# Patient Record
Sex: Male | Born: 1960
Health system: Southern US, Community
[De-identification: ages and names within clinical notes are randomized; demographics above are authoritative.]

## PROBLEM LIST (undated history)

## (undated) DIAGNOSIS — Z8719 Personal history of other diseases of the digestive system: Secondary | ICD-10-CM

## (undated) DIAGNOSIS — E785 Hyperlipidemia, unspecified: Secondary | ICD-10-CM

## (undated) DIAGNOSIS — N200 Calculus of kidney: Secondary | ICD-10-CM

## (undated) DIAGNOSIS — K219 Gastro-esophageal reflux disease without esophagitis: Secondary | ICD-10-CM

## (undated) DIAGNOSIS — G4730 Sleep apnea, unspecified: Secondary | ICD-10-CM

## (undated) DIAGNOSIS — M1990 Unspecified osteoarthritis, unspecified site: Secondary | ICD-10-CM

## (undated) DIAGNOSIS — I219 Acute myocardial infarction, unspecified: Secondary | ICD-10-CM

## (undated) DIAGNOSIS — I2510 Atherosclerotic heart disease of native coronary artery without angina pectoris: Secondary | ICD-10-CM

## (undated) DIAGNOSIS — M109 Gout, unspecified: Secondary | ICD-10-CM

## (undated) DIAGNOSIS — I259 Chronic ischemic heart disease, unspecified: Secondary | ICD-10-CM

## (undated) DIAGNOSIS — I10 Essential (primary) hypertension: Secondary | ICD-10-CM

## (undated) DIAGNOSIS — F32A Depression, unspecified: Secondary | ICD-10-CM

## (undated) DIAGNOSIS — F419 Anxiety disorder, unspecified: Secondary | ICD-10-CM

## (undated) DIAGNOSIS — N189 Chronic kidney disease, unspecified: Secondary | ICD-10-CM

## (undated) HISTORY — DX: Depression, unspecified: F32.A

## (undated) HISTORY — DX: Sleep apnea, unspecified: G47.30

## (undated) HISTORY — DX: Anxiety disorder, unspecified: F41.9

## (undated) HISTORY — DX: Chronic ischemic heart disease, unspecified: I25.9

## (undated) HISTORY — DX: Hyperlipidemia, unspecified: E78.5

## (undated) HISTORY — DX: Chronic kidney disease, unspecified: N18.9

## (undated) HISTORY — DX: Calculus of kidney: N20.0

## (undated) HISTORY — DX: Gastro-esophageal reflux disease without esophagitis: K21.9

## (undated) HISTORY — DX: Unspecified osteoarthritis, unspecified site: M19.90

## (undated) HISTORY — DX: Gout, unspecified: M10.9

---

## 1992-10-20 HISTORY — PX: HERNIA REPAIR: SHX51

## 1998-10-20 DIAGNOSIS — I219 Acute myocardial infarction, unspecified: Secondary | ICD-10-CM

## 1998-10-20 HISTORY — PX: CORONARY ANGIOPLASTY: SHX604

## 1998-10-20 HISTORY — DX: Acute myocardial infarction, unspecified: I21.9

## 1998-12-17 ENCOUNTER — Encounter: Payer: Self-pay | Admitting: *Deleted

## 1998-12-17 ENCOUNTER — Observation Stay (HOSPITAL_COMMUNITY): Admission: AD | Admit: 1998-12-17 | Discharge: 1998-12-18 | Payer: Self-pay | Admitting: *Deleted

## 2005-10-20 HISTORY — PX: HIATAL HERNIA REPAIR: SHX195

## 2006-06-23 ENCOUNTER — Emergency Department (HOSPITAL_COMMUNITY): Admission: EM | Admit: 2006-06-23 | Discharge: 2006-06-23 | Payer: Self-pay | Admitting: Emergency Medicine

## 2006-10-20 HISTORY — PX: COLONOSCOPY: SHX174

## 2006-10-20 HISTORY — PX: CYST REMOVAL NECK: SHX6281

## 2008-10-20 HISTORY — PX: OTHER SURGICAL HISTORY: SHX169

## 2008-10-20 HISTORY — PX: CARPAL TUNNEL RELEASE: SHX101

## 2008-11-17 ENCOUNTER — Ambulatory Visit: Payer: Self-pay | Admitting: Cardiology

## 2010-06-06 ENCOUNTER — Ambulatory Visit: Payer: Self-pay | Admitting: Cardiology

## 2014-10-26 ENCOUNTER — Other Ambulatory Visit: Payer: Self-pay | Admitting: Urology

## 2014-10-30 NOTE — Progress Notes (Signed)
Called Dr. Emmaline Potter's office requested orders in Epic to  Pre op 01-15-16be put in under sign and release surgery 11-07-14, Thanks

## 2014-11-02 NOTE — Patient Instructions (Addendum)
Ray ChurchJoseph T Compere  11/02/2014   Your procedure is scheduled on: Tuesday 11/07/2014  [facility]  Entr[facility] and follow signs to               Short Stay Center at  0530 AM.  Call this number if you have problems the morning of surgery [phone]   Remember:  Do not eat food or drink liquids :After Midnight.     Take these medicines the morning of surgery with A SIP OF WATER: Metoprolol, Omeprazole, Prednisone                               You may not have any metal on your body including hair pins and              piercings  Do not wear jewelry, make-up, lotions, powders or perfumes.             Do not wear nail polish.  Do not shave  48 hours prior to surgery.              Men may shave face and neck.   Do not bring valuables to the hospital. [facility] IS NOT             RESPONSIBLE   FOR VALUABLES.  Contacts, dentures or bridgework may not be worn into surgery.  Leave suitcase in the car. After surgery it may be brought to your room.     Patients discharged the day of surgery will not be allowed to drive home.  Name and phone number of your driver:  Special Instructions: N/A              Please read over the following fact sheets you were given: _____________________________________________________________________             [facility] - Preparing for Surgery Before surgery, you can play an important role.  Because skin is not sterile, your skin needs to be as free of germs as possible.  You can reduce the number of germs on your skin by washing with CHG (chlorahexidine gluconate) soap before surgery.  CHG is an antiseptic cleaner which kills germs and bonds with the skin to continue killing germs even after washing. Please DO NOT use if you have an allergy to CHG or antibacterial soaps.  If your skin becomes reddened/irritated stop using the CHG and inform your nurse when you arrive at Short Stay. Do not shave (including legs and  underarms) for at least 48 hours prior to the first CHG shower.  You may shave your face/neck. Please follow these instructions carefully:  1.  Shower with CHG Soap the night before surgery and the  morning of Surgery.  2.  If you choose to wash your hair, wash your hair first as usual with your  normal  shampoo.  3.  After you shampoo, rinse your hair and body thoroughly to remove the  shampoo.                           4.  Use CHG as you would any other liquid soap.  You can apply chg directly  to the skin and wash  Gently with a scrungie or clean washcloth.  5.  Apply the CHG Soap to your body ONLY FROM THE NECK DOWN.   Do not use on face/ open                           Wound or open sores. Avoid contact with eyes, ears mouth and genitals (private parts).                       Wash face,  Genitals (private parts) with your normal soap.             6.  Wash thoroughly, paying special attention to the area where your surgery  will be performed.  7.  Thoroughly rinse your body with warm water from the neck down.  8.  DO NOT shower/wash with your normal soap after using and rinsing off  the CHG Soap.                9.  Pat yourself dry with a clean towel.            10.  Wear clean pajamas.            11.  Place clean sheets on your bed the night of your first shower and do not  sleep with pets. Day of Surgery : Do not apply any lotions/deodorants the morning of surgery.  Please wear clean clothes to the hospital/surgery center.  FAILURE TO FOLLOW THESE INSTRUCTIONS MAY RESULT IN THE CANCELLATION OF YOUR SURGERY PATIENT SIGNATURE_________________________________  NURSE SIGNATURE__________________________________  ________________________________________________________________________   Adam Phenix  An incentive spirometer is a tool that can help keep your lungs clear and active. This tool measures how well you are filling your lungs with each breath. Taking  long deep breaths may help reverse or decrease the chance of developing breathing (pulmonary) problems (especially infection) following:  A long period of time when you are unable to move or be active. BEFORE THE PROCEDURE   If the spirometer includes an indicator to show your best effort, your nurse or respiratory therapist will set it to a desired goal.  If possible, sit up straight or lean slightly forward. Try not to slouch.  Hold the incentive spirometer in an upright position. INSTRUCTIONS FOR USE  1. Sit on the edge of your bed if possible, or sit up as far as you can in bed or on a chair. 2. Hold the incentive spirometer in an upright position. 3. Breathe out normally. 4. Place the mouthpiece in your mouth and seal your lips tightly around it. 5. Breathe in slowly and as deeply as possible, raising the piston or the ball toward the top of the column. 6. Hold your breath for 3-5 seconds or for as long as possible. Allow the piston or ball to fall to the bottom of the column. 7. Remove the mouthpiece from your mouth and breathe out normally. 8. Rest for a few seconds and repeat Steps 1 through 7 at least 10 times every 1-2 hours when you are awake. Take your time and take a few normal breaths between deep breaths. 9. The spirometer may include an indicator to show your best effort. Use the indicator as a goal to work toward during each repetition. 10. After each set of 10 deep breaths, practice coughing to be sure your lungs are clear. If you have an incision (the cut made at the time of surgery),  support your incision when coughing by placing a pillow or rolled up towels firmly against it. Once you are able to get out of bed, walk around indoors and cough well. You may stop using the incentive spirometer when instructed by your caregiver.  RISKS AND COMPLICATIONS  Take your time so you do not get dizzy or light-headed.  If you are in pain, you may need to take or ask for pain  medication before doing incentive spirometry. It is harder to take a deep breath if you are having pain. AFTER USE  Rest and breathe slowly and easily.  It can be helpful to keep track of a log of your progress. Your caregiver can provide you with a simple table to help with this. If you are using the spirometer at home, follow these instructions: [place] IF:   You are having difficultly using the spirometer.  You have trouble using the spirometer as often as instructed.  Your pain medication is not giving enough relief while using the spirometer.  You develop fever of 100.5 F (38.1 C) or higher. SEEK IMMEDIATE MEDICAL CARE IF:   You cough up bloody sputum that had not been present before.  You develop fever of 102 F (38.9 C) or greater.  You develop worsening pain at or near the incision site. MAKE SURE YOU:   Understand these instructions.  Will watch your condition.  Will get help right away if you are not doing well or get worse. Document Released: 02/16/2007 Document Revised: 12/29/2011 Document Reviewed: 04/19/2007 [facility] Patient Information 2014 [place], [place].   ________________________________________________________________________

## 2014-11-03 ENCOUNTER — Encounter (HOSPITAL_COMMUNITY): Payer: Self-pay

## 2014-11-03 ENCOUNTER — Encounter (HOSPITAL_COMMUNITY)
Admission: RE | Admit: 2014-11-03 | Discharge: 2014-11-03 | Disposition: A | Discharge status: Home / Self Care | Payer: 59 | Source: Ambulatory Visit | Attending: Urology | Admitting: Urology

## 2014-11-03 ENCOUNTER — Other Ambulatory Visit: Payer: Self-pay

## 2014-11-03 ENCOUNTER — Ambulatory Visit (HOSPITAL_COMMUNITY)
Admission: RE | Admit: 2014-11-03 | Discharge: 2014-11-03 | Disposition: A | Discharge status: Home / Self Care | Payer: 59 | Source: Ambulatory Visit | Attending: Anesthesiology | Admitting: Anesthesiology

## 2014-11-03 VITALS — BP 108/75 | HR 98 | Temp 98.0°F | Resp 16 | Ht 66.0 in | Wt 206.0 lb

## 2014-11-03 DIAGNOSIS — Z01818 Encounter for other preprocedural examination: Secondary | ICD-10-CM | POA: Insufficient documentation

## 2014-11-03 DIAGNOSIS — I10 Essential (primary) hypertension: Secondary | ICD-10-CM

## 2014-11-03 HISTORY — DX: Essential (primary) hypertension: I10

## 2014-11-03 HISTORY — DX: Acute myocardial infarction, unspecified: I21.9

## 2014-11-03 HISTORY — DX: Atherosclerotic heart disease of native coronary artery without angina pectoris: I25.10

## 2014-11-03 HISTORY — DX: Personal history of other diseases of the digestive system: Z87.19

## 2014-11-03 LAB — CBC
HEMATOCRIT: 49.2 % (ref 39.0–52.0)
HEMOGLOBIN: 16.3 g/dL (ref 13.0–17.0)
MCH: 32.1 pg (ref 26.0–34.0)
MCHC: 33.1 g/dL (ref 30.0–36.0)
MCV: 97 fL (ref 78.0–100.0)
Platelets: 185 10*3/uL (ref 150–400)
RBC: 5.07 MIL/uL (ref 4.22–5.81)
RDW: 12.7 % (ref 11.5–15.5)
WBC: 7.4 10*3/uL (ref 4.0–10.5)

## 2014-11-03 LAB — BASIC METABOLIC PANEL
Anion gap: 8 (ref 5–15)
BUN: 17 mg/dL (ref 6–23)
CALCIUM: 9.9 mg/dL (ref 8.4–10.5)
CO2: 28 mmol/L (ref 19–32)
Chloride: 103 mEq/L (ref 96–112)
Creatinine, Ser: 0.99 mg/dL (ref 0.50–1.35)
Glucose, Bld: 85 mg/dL (ref 70–99)
POTASSIUM: 4.1 mmol/L (ref 3.5–5.1)
Sodium: 139 mmol/L (ref 135–145)

## 2014-11-03 NOTE — Progress Notes (Signed)
12/17/1998-information from [employer] on cardiac stent on chart.

## 2014-11-03 NOTE — Progress Notes (Signed)
   11/03/14 1232  OBSTRUCTIVE SLEEP APNEA  Have you ever been diagnosed with sleep apnea through a sleep study? No  Do you snore loudly (loud enough to be heard through closed doors)?  1  Do you often feel tired, fatigued, or sleepy during the daytime? 1  Has anyone observed you stop breathing during your sleep? 0  Do you have, or are you being treated for high blood pressure? 1  BMI more than 35 kg/m2? 0  Age over 462 years old? 1  Neck circumference greater than 40 cm/16 inches? 1  Gender: 1  Obstructive Sleep Apnea Score 6  Score 4 or greater  Results sent to PCP

## 2014-11-03 NOTE — Progress Notes (Signed)
07/19/2013-EKG on chart from [facility] 10/03/2014-Last office note on chart from [facility] from Dr. Donzetta Sprungerry Daniel

## 2014-11-06 ENCOUNTER — Encounter (HOSPITAL_COMMUNITY): Payer: Self-pay

## 2014-11-06 MED ORDER — GENTAMICIN SULFATE 40 MG/ML IJ SOLN
380.0000 mg | INTRAVENOUS | Status: AC
  Administered 2014-11-07: 380 mg via INTRAVENOUS
  Filled 2014-11-06: qty 9.5

## 2014-11-06 NOTE — Progress Notes (Signed)
Consulted with Dr. Rose abouOkey Dupret EKG from 11/03/2014 and Dr. [name] states "patient is OK for surgery"

## 2014-11-07 ENCOUNTER — Ambulatory Visit (HOSPITAL_COMMUNITY): Payer: 59 | Admitting: Certified Registered Nurse Anesthetist

## 2014-11-07 ENCOUNTER — Encounter (HOSPITAL_COMMUNITY): Payer: Self-pay | Admitting: *Deleted

## 2014-11-07 ENCOUNTER — Ambulatory Visit (HOSPITAL_COMMUNITY)
Admission: RE | Admit: 2014-11-07 | Discharge: 2014-11-07 | Disposition: A | Discharge status: Home / Self Care | Payer: 59 | Source: Ambulatory Visit | Attending: Urology | Admitting: Urology

## 2014-11-07 ENCOUNTER — Encounter (HOSPITAL_COMMUNITY)
Admission: RE | Disposition: A | Discharge status: Home / Self Care | Payer: Self-pay | Source: Ambulatory Visit | Attending: Urology

## 2014-11-07 ENCOUNTER — Ambulatory Visit (HOSPITAL_COMMUNITY): Payer: 59

## 2014-11-07 DIAGNOSIS — F17210 Nicotine dependence, cigarettes, uncomplicated: Secondary | ICD-10-CM | POA: Insufficient documentation

## 2014-11-07 DIAGNOSIS — I10 Essential (primary) hypertension: Secondary | ICD-10-CM | POA: Insufficient documentation

## 2014-11-07 DIAGNOSIS — R310 Gross hematuria: Secondary | ICD-10-CM | POA: Insufficient documentation

## 2014-11-07 DIAGNOSIS — I252 Old myocardial infarction: Secondary | ICD-10-CM | POA: Diagnosis not present

## 2014-11-07 DIAGNOSIS — I2510 Atherosclerotic heart disease of native coronary artery without angina pectoris: Secondary | ICD-10-CM | POA: Insufficient documentation

## 2014-11-07 DIAGNOSIS — Z7982 Long term (current) use of aspirin: Secondary | ICD-10-CM | POA: Insufficient documentation

## 2014-11-07 DIAGNOSIS — N200 Calculus of kidney: Secondary | ICD-10-CM | POA: Insufficient documentation

## 2014-11-07 HISTORY — PX: HOLMIUM LASER APPLICATION: SHX5852

## 2014-11-07 HISTORY — PX: CYSTOSCOPY WITH RETROGRADE PYELOGRAM, URETEROSCOPY AND STENT PLACEMENT: SHX5789

## 2014-11-07 SURGERY — CYSTOURETEROSCOPY, WITH RETROGRADE PYELOGRAM AND STENT INSERTION
Anesthesia: General | Laterality: Bilateral

## 2014-11-07 MED ORDER — OXYCODONE-ACETAMINOPHEN 5-325 MG PO TABS
1.0000 | ORAL_TABLET | Freq: Four times a day (QID) | ORAL | Status: DC | PRN
  Administered 2014-11-07: 2 via ORAL
  Filled 2014-11-07: qty 2

## 2014-11-07 MED ORDER — FENTANYL CITRATE 0.05 MG/ML IJ SOLN
25.0000 ug | INTRAMUSCULAR | Status: DC | PRN
  Administered 2014-11-07 (×4): 25 ug via INTRAVENOUS

## 2014-11-07 MED ORDER — PROPOFOL 10 MG/ML IV BOLUS
INTRAVENOUS | Status: DC | PRN
  Administered 2014-11-07: 25 mg via INTRAVENOUS
  Administered 2014-11-07: 175 mg via INTRAVENOUS

## 2014-11-07 MED ORDER — EPHEDRINE SULFATE 50 MG/ML IJ SOLN
INTRAMUSCULAR | Status: AC
  Filled 2014-11-07: qty 1

## 2014-11-07 MED ORDER — HYDROMORPHONE HCL 1 MG/ML IJ SOLN: 0.2500 mg | INTRAMUSCULAR | Status: DC | PRN

## 2014-11-07 MED ORDER — ACETAMINOPHEN 10 MG/ML IV SOLN
1000.0000 mg | Freq: Once | INTRAVENOUS | Status: AC
  Administered 2014-11-07: 1000 mg via INTRAVENOUS
  Filled 2014-11-07: qty 100

## 2014-11-07 MED ORDER — SULFAMETHOXAZOLE-TRIMETHOPRIM 800-160 MG PO TABS
1.0000 | ORAL_TABLET | Freq: Two times a day (BID) | ORAL | Status: DC
Start: 2014-11-07 — End: 2019-01-31

## 2014-11-07 MED ORDER — OXYCODONE HCL 5 MG PO TABS: 5.0000 mg | ORAL_TABLET | Freq: Once | ORAL | Status: DC | PRN

## 2014-11-07 MED ORDER — LIDOCAINE HCL (CARDIAC) 20 MG/ML IV SOLN
INTRAVENOUS | Status: AC
  Filled 2014-11-07: qty 5

## 2014-11-07 MED ORDER — SODIUM CHLORIDE 0.9 % IV SOLN
10.0000 mg | INTRAVENOUS | Status: DC | PRN
  Administered 2014-11-07: 40 ug/min via INTRAVENOUS

## 2014-11-07 MED ORDER — OXYCODONE-ACETAMINOPHEN 5-325 MG PO TABS: 1.0000 | ORAL_TABLET | Freq: Four times a day (QID) | ORAL | Status: DC | PRN

## 2014-11-07 MED ORDER — ONDANSETRON HCL 4 MG/2ML IJ SOLN
INTRAMUSCULAR | Status: DC | PRN
  Administered 2014-11-07: 4 mg via INTRAVENOUS

## 2014-11-07 MED ORDER — PROPOFOL 10 MG/ML IV BOLUS
INTRAVENOUS | Status: AC
  Filled 2014-11-07: qty 20

## 2014-11-07 MED ORDER — OXYCODONE HCL 5 MG/5ML PO SOLN: 5.0000 mg | Freq: Once | ORAL | Status: DC | PRN

## 2014-11-07 MED ORDER — EPHEDRINE SULFATE 50 MG/ML IJ SOLN
INTRAMUSCULAR | Status: DC | PRN
  Administered 2014-11-07: 10 mg via INTRAVENOUS

## 2014-11-07 MED ORDER — PROMETHAZINE HCL 25 MG/ML IJ SOLN: 6.2500 mg | INTRAMUSCULAR | Status: DC | PRN

## 2014-11-07 MED ORDER — PHENYLEPHRINE HCL 10 MG/ML IJ SOLN
INTRAMUSCULAR | Status: DC | PRN
  Administered 2014-11-07: 40 ug via INTRAVENOUS

## 2014-11-07 MED ORDER — SENNOSIDES-DOCUSATE SODIUM 8.6-50 MG PO TABS: 1.0000 | ORAL_TABLET | Freq: Two times a day (BID) | ORAL | Status: DC

## 2014-11-07 MED ORDER — LIDOCAINE HCL (CARDIAC) 20 MG/ML IV SOLN
INTRAVENOUS | Status: DC | PRN
  Administered 2014-11-07: 75 mg via INTRAVENOUS

## 2014-11-07 MED ORDER — FENTANYL CITRATE 0.05 MG/ML IJ SOLN
INTRAMUSCULAR | Status: AC
  Filled 2014-11-07: qty 2

## 2014-11-07 MED ORDER — ATROPINE SULFATE 0.4 MG/ML IJ SOLN
INTRAMUSCULAR | Status: AC
  Filled 2014-11-07: qty 1

## 2014-11-07 MED ORDER — OXYCODONE HCL 5 MG PO TABS: 5.0000 mg | ORAL_TABLET | Freq: Four times a day (QID) | ORAL | Status: DC | PRN

## 2014-11-07 MED ORDER — MIDAZOLAM HCL 2 MG/2ML IJ SOLN
INTRAMUSCULAR | Status: AC
  Filled 2014-11-07: qty 2

## 2014-11-07 MED ORDER — FENTANYL CITRATE 0.05 MG/ML IJ SOLN
INTRAMUSCULAR | Status: AC
  Filled 2014-11-07: qty 5

## 2014-11-07 MED ORDER — MIDAZOLAM HCL 5 MG/5ML IJ SOLN
INTRAMUSCULAR | Status: DC | PRN
  Administered 2014-11-07 (×2): .5 mg via INTRAVENOUS

## 2014-11-07 MED ORDER — LACTATED RINGERS IV SOLN
INTRAVENOUS | Status: DC | PRN
  Administered 2014-11-07 (×3): via INTRAVENOUS

## 2014-11-07 MED ORDER — PHENYLEPHRINE HCL 10 MG/ML IJ SOLN
INTRAMUSCULAR | Status: AC
  Filled 2014-11-07: qty 1

## 2014-11-07 MED ORDER — 0.9 % SODIUM CHLORIDE (POUR BTL) OPTIME
TOPICAL | Status: DC | PRN
  Administered 2014-11-07: 1000 mL

## 2014-11-07 MED ORDER — ONDANSETRON HCL 4 MG/2ML IJ SOLN
INTRAMUSCULAR | Status: AC
  Filled 2014-11-07: qty 2

## 2014-11-07 MED ORDER — FENTANYL CITRATE 0.05 MG/ML IJ SOLN
INTRAMUSCULAR | Status: DC | PRN
  Administered 2014-11-07 (×2): 50 ug via INTRAVENOUS
  Administered 2014-11-07 (×2): 25 ug via INTRAVENOUS
  Administered 2014-11-07 (×2): 50 ug via INTRAVENOUS

## 2014-11-07 MED ORDER — IOHEXOL 300 MG/ML  SOLN
INTRAMUSCULAR | Status: DC | PRN
  Administered 2014-11-07: 25 mL via URETHRAL

## 2014-11-07 MED ORDER — SODIUM CHLORIDE 0.9 % IJ SOLN
INTRAMUSCULAR | Status: AC
  Filled 2014-11-07: qty 10

## 2014-11-07 SURGICAL SUPPLY — 29 items
BAG URINE DRAINAGE (UROLOGICAL SUPPLIES) ×2 IMPLANT
BASKET LASER NITINOL 1.9FR (BASKET) ×1 IMPLANT
BASKET STNLS GEMINI 4WIRE 3FR (BASKET) IMPLANT
BASKET ZERO TIP NITINOL 2.4FR (BASKET) IMPLANT
BSKT STON RTRVL 120 1.9FR (BASKET) ×1
BSKT STON RTRVL GEM 120X11 3FR (BASKET)
BSKT STON RTRVL ZERO TP 2.4FR (BASKET)
CATH INTERMIT  6FR 70CM (CATHETERS) ×2 IMPLANT
CLOTH BEACON ORANGE TIMEOUT ST (SAFETY) ×2 IMPLANT
DRAPE CAMERA CLOSED 9X96 (DRAPES) ×2 IMPLANT
ELECT REM PT RETURN 9FT ADLT (ELECTROSURGICAL)
ELECTRODE REM PT RTRN 9FT ADLT (ELECTROSURGICAL) IMPLANT
FIBER LASER FLEXIVA 1000 (UROLOGICAL SUPPLIES) IMPLANT
FIBER LASER FLEXIVA 200 (UROLOGICAL SUPPLIES) ×1 IMPLANT
FIBER LASER FLEXIVA 365 (UROLOGICAL SUPPLIES) IMPLANT
FIBER LASER FLEXIVA 550 (UROLOGICAL SUPPLIES) IMPLANT
FIBER LASER TRAC TIP (UROLOGICAL SUPPLIES) ×1 IMPLANT
GLOVE BIOGEL M STRL SZ7.5 (GLOVE) ×2 IMPLANT
GOWN STRL REUS W/TWL LRG LVL3 (GOWN DISPOSABLE) ×2 IMPLANT
GUIDEWIRE ANG ZIPWIRE 038X150 (WIRE) ×3 IMPLANT
GUIDEWIRE STR DUAL SENSOR (WIRE) ×2 IMPLANT
IV NS IRRIG 3000ML ARTHROMATIC (IV SOLUTION) ×2 IMPLANT
PACK CYSTO (CUSTOM PROCEDURE TRAY) ×2 IMPLANT
SHEATH ACCESS URETERAL 38CM (SHEATH) ×1 IMPLANT
SHIELD EYE BINOCULAR (MISCELLANEOUS) IMPLANT
STENT CONTOUR 6FRX28X.038 (STENTS) ×1 IMPLANT
SYRINGE 10CC LL (SYRINGE) IMPLANT
SYRINGE IRR TOOMEY STRL 70CC (SYRINGE) IMPLANT
TUBE FEEDING 8FR 16IN STR KANG (MISCELLANEOUS) ×2 IMPLANT

## 2014-11-07 NOTE — Anesthesia Preprocedure Evaluation (Addendum)
Anesthesia Evaluation  Patient identified by MRN, date of birth, ID band Patient awake    Reviewed: Allergy & Precautions, NPO status , Patient's Chart, lab work & pertinent test results  History of Anesthesia Complications Negative for: history of anesthetic complications  Airway Mallampati: II  TM Distance: >3 FB Neck ROM: Full    Dental no notable dental hx.    Pulmonary Current Smoker,  breath sounds clear to auscultation  Pulmonary exam normal       Cardiovascular Exercise Tolerance: Good hypertension, Pt. on medications and Pt. on home beta blockers + CAD and + Past MI Rhythm:Regular Rate:Normal     Neuro/Psych negative neurological ROS     GI/Hepatic Neg liver ROS, hiatal hernia,   Endo/Other  negative endocrine ROS  Renal/GU      Musculoskeletal   Abdominal   Peds  Hematology negative hematology ROS (+)   Anesthesia Other Findings   Reproductive/Obstetrics                            Anesthesia Physical Anesthesia Plan  ASA: III  Anesthesia Plan: General   Post-op Pain Management:    Induction: Intravenous  Airway Management Planned: LMA  Additional Equipment:   Intra-op Plan:   Post-operative Plan: Extubation in OR  Informed Consent: I have reviewed the patients History and Physical, chart, labs and discussed the procedure including the risks, benefits and alternatives for the proposed anesthesia with the patient or authorized representative who has indicated his/her understanding and acceptance.   Dental advisory given  Plan Discussed with: CRNA  Anesthesia Plan Comments:        Anesthesia Quick Evaluation

## 2014-11-07 NOTE — Addendum Note (Signed)
Addendum  created 11/07/14 1236 by Edison PaceJoanne E Malanie Koloski, CRNA   Modules edited: Anesthesia Medication Administration

## 2014-11-07 NOTE — Transfer of Care (Signed)
Immediate Anesthesia Transfer of Care Note  Patient: Caleb ChurchJoseph T Tellez  Procedure(s) Performed: Procedure(s): CYSTOSCOPY WITH RETROGRADE PYELOGRAM, URETEROSCOPY AND STENT PLACEMENT (Bilateral) HOLMIUM LASER APPLICATION (Bilateral)  Patient Location: PACU  Anesthesia Type:General  Level of Consciousness: awake, alert , oriented, patient cooperative and responds to stimulation  Airway & Oxygen Therapy: Patient Spontanous Breathing and Patient connected to face mask oxygen  Post-op Assessment: Report given to PACU RN, Post -op Vital signs reviewed and stable and Patient moving all extremities  Post vital signs: Reviewed and stable  Complications: No apparent anesthesia complications

## 2014-11-07 NOTE — Discharge Instructions (Signed)
1 - You may have urinary urgency (bladder spasms) and bloody urine on / off with stent in place. This is normal. ° °2 - Call MD or go to ER for fever >102, severe pain / nausea / vomiting not relieved by medications, or acute change in medical status ° °

## 2014-11-07 NOTE — H&P (Signed)
Caleb ChurchJoseph T Potter is an 54 y.o. male.    Chief Complaint: Pre-Op Bilateral Ureteroscopic Stone Manipulation  HPI:    1 - Bilateral Renal Stones - Rt 10mm and Lt 5mm renal stones on eval recurrent gross hematuria 2015. NO colic episodes. Gross hematuria persistnant.   PMH sig for CAD/MI (on ASA).  Today "Caleb Potter" is seen to proceed with bilateral ureteroscopic stone manipulation for diagnostic and theraputic intent.   Past Medical History  Diagnosis Date  . Hypertension   . Myocardial infarction 2000    light MI  . History of hiatal hernia   . Coronary artery disease     Past Surgical History  Procedure Laterality Date  . Coronary angioplasty  2000  . Hernia repair  1994    left inguinal  . Hiatal hernia repair  2007  . Cyst removal neck  2008  . Carpal tunnel release  2010    right wrist  . Right knee reconstruction  2010    right    History reviewed. No pertinent family history. Social History:  reports that he has been smoking Cigarettes.  He has a 40 pack-year smoking history. He does not have any smokeless tobacco history on file. He reports that he drinks alcohol. His drug history is not on file.  Allergies:  Allergies  Allergen Reactions  . Citalopram     Grind teeth. "high power cocaine"     Medications Prior to Admission  Medication Sig Dispense Refill  . acetaminophen (TYLENOL) 650 MG CR tablet Take 650 mg by mouth 4 (four) times daily as needed for pain.    . Ascorbic Acid (VITAMIN C) 1000 MG tablet Take 1,000 mg by mouth daily.    Marland Kitchen. aspirin EC 81 MG tablet Take 81 mg by mouth daily.    . cholecalciferol (VITAMIN D) 1000 UNITS tablet Take 1,000 Units by mouth daily.    . diazepam (VALIUM) 10 MG tablet Take 10 mg by mouth 2 (two) times daily as needed for anxiety.    Marland Kitchen. lisinopril (PRINIVIL,ZESTRIL) 20 MG tablet Take 20 mg by mouth every morning.    . metoprolol succinate (TOPROL-XL) 50 MG 24 hr tablet Take 50 mg by mouth every morning. Take with or immediately  following a meal.    . Omega-3 Fatty Acids (FISH OIL) 1200 MG CAPS Take 1,200 mg by mouth daily.    Marland Kitchen. omeprazole (PRILOSEC) 20 MG capsule Take 20 mg by mouth daily.    . predniSONE (DELTASONE) 5 MG tablet Take 5-10 mg by mouth daily. Will take 1 tablet daily but when it gets cold sometimes will have to take 2 because of fingers locking up.    . simvastatin (ZOCOR) 80 MG tablet Take 40 mg by mouth daily.    . tamsulosin (FLOMAX) 0.4 MG CAPS capsule Take 0.4 mg by mouth daily.    . traMADol (ULTRAM) 50 MG tablet Take 50 mg by mouth every 6 (six) hours as needed (Pain).      No results found for this or any previous visit (from the past 48 hour(s)). No results found.  Review of Systems  Constitutional: Negative.  Negative for fever and chills.  HENT: Negative.   Eyes: Negative.   Respiratory: Negative.   Cardiovascular: Negative.   Gastrointestinal: Negative.   Genitourinary: Positive for hematuria.  Musculoskeletal: Negative.   Skin: Negative.   Neurological: Negative.   Endo/Heme/Allergies: Negative.   Psychiatric/Behavioral: Negative.     Blood pressure 127/81, pulse 91, temperature 98.1 F (  36.7 C), temperature source Oral, resp. rate 18, height  (1.676 m), weight 93.441 kg (206 lb), SpO2 96 %. Physical Exam  Constitutional: He appears well-developed.  HENT:  Head: Normocephalic.  Eyes: Pupils are equal, round, and reactive to light.  Neck: Normal range of motion.  Cardiovascular: Normal rate.   Respiratory: Effort normal.  GI: Soft.  Genitourinary:  No CVAT  Musculoskeletal: Normal range of motion.  Neurological: He is alert.  Skin: Skin is warm.  Psychiatric: He has a normal mood and affect. His behavior is normal. Judgment and thought content normal.     Assessment/Plan   1 - Bilateral Renal Stones - risks and benefits of bilateral ureteroscopy with goal of ruling out other etiologies and managing stones discussed previuosly and reiterated today. Pt voiced  understanding and desire to proceed as planned. Will need bilateral stents peri-op.   Lesieli Bresee 11/07/2014, 7:13 AM

## 2014-11-07 NOTE — Brief Op Note (Signed)
11/07/2014  9:20 AM  PATIENT:  Caleb ChurchJoseph T Potter  54 y.o. male  PRE-OPERATIVE DIAGNOSIS:  HEMATURIA, BILATERAL KIDNEY STONES  POST-OPERATIVE DIAGNOSIS:  HEMATURIA, BILATERAL KIDNEY STONES  PROCEDURE:  Procedure(s): CYSTOSCOPY WITH RETROGRADE PYELOGRAM, URETEROSCOPY AND STENT PLACEMENT (Bilateral) HOLMIUM LASER APPLICATION (Bilateral)  SURGEON:  Surgeon(s) and Role:    * Sebastian Acheheodore Laelynn Blizzard, MD - Primary  PHYSICIAN ASSISTANT:   ASSISTANTS: none   ANESTHESIA:   general  EBL:  Total I/O In: 1000 [I.V.:1000] Out: -   BLOOD ADMINISTERED:none  DRAINS: none   LOCAL MEDICATIONS USED:  NONE  SPECIMEN:  Source of Specimen:  Rt and Lt renal stone fragments  DISPOSITION OF SPECIMEN:  PATHOLOGY  COUNTS:  YES  TOURNIQUET:  * No tourniquets in log *  DICTATION: .Other Dictation: Dictation Number [id number]  PLAN OF CARE: Discharge to home after PACU  PATIENT DISPOSITION:  PACU - hemodynamically stable.   Delay start of Pharmacological VTE agent (>24hrs) due to surgical blood loss or risk of bleeding: yes

## 2014-11-07 NOTE — Op Note (Signed)
NAMBeverly Potter:  Kallal, Michaeal               ACCOUNT NO.:  [id number]  MEDICAL RECORD NO.:  [id number]  LOCATION:  [facility]                         [facility]  PHYSICIAN:  Sebastian Acheheodore Meerab Maselli, MD     DATE OF BIRTH:  [birth year]  DATE OF PROCEDURE:  11/07/2014 DATE OF DISCHARGE:                              OPERATIVE REPORT   DIAGNOSES:  Right greater than left nephrolithiasis, recurrent gross hematuria.  PROCEDURE: 1. Cystoscopy with bilateral retrograde pyelogram interpretation. 2. Bilateral ureteroscopy with laser lithotripsy. 3. Insertion of bilateral ureteral stents, right 6 x 28, left 6 x 26,     no tether contour type.  ESTIMATED BLOOD LOSS:  Nil.  COMPLICATIONS:  None.  SPECIMENS:  Right and left renal stone fragments for compositional analysis.  FINDINGS:  Right nephrolithiasis dominant proximal 1 cm stone, smaller 5 mm stone, and left intrarenal nephrolithiasis with 5 mm upper pole stone.  No intraureteral or perirenal lesion seen.  No bladder lesion seen.  INDICATIONS:  Mr. Caleb Potter is a pleasant 54 year old gentleman with history of recurrent gross hematuria.  His workup only revealed bilateral intrarenal stones.  His hematuria has been gross in nature and quite bothersome to him.  It was felt to be presumably from his intrarenal stones.  Options were discussed including observation versus therapy to remove the stones including bilateral ureteroscopy with the goal being to obtain stone free status and help her find no other cause for hematuria and he adamantly wished to proceed.  Informed consent was obtained and placed in medical record.  PROCEDURE IN DETAIL:  The patient being, Caleb MilchJoseph Pesqueira was verified. Procedure being bilateral ureteroscopic stone manipulation was confirmed.  Procedure was carried out.  Time-out was performed. Intravenous antibiotics were administered.  General LMA anesthesia was induced.  The patient was placed into a low lithotomy position.   Sterile field was created by prepping and draping the patient's penis, perineum, and proximal thighs using iodine x3.  Next, cystourethroscopy was performed using a 22-French rigid cystoscope with 12-degree offset lens. Inspection of the anterior and posterior urethra unremarkable. Inspection of the urinary bladder revealed no diverticula, calcifications, or papular lesions.  Ureteral orifices were in normal anatomic position and singleton.  The right ureteral orifice was cannulated with a 6-French end-hole catheter and right retrograde pyelogram was obtained.  Right retrograde pyelogram demonstrated a single right ureter, single system right kidney.  There were no obvious filling defects or narrowing noted.  A 0.038 zip wire was advanced at the level of the upper pole and set aside as a safety wire.  Next, semi-rigid ureteroscopy performed of the distal two-thirds of the right ureter alongside a separate Sensor working wire.  No mucosal abnormalities were found.  The semi-rigid scope was exchanged for a 12/14, 38 cm ureteral access sheath at the level of proximal ureter using continuous fluoroscopic guidance.  Next, flexible digital ureteroscopy was performed at the proximal third of the right ureter and systematic inspection of the right kidney x2.  Proximal ureter was unremarkable.  There were 2 calcifications in the kidney, one larger 1 cm stone in the upper pole and smaller approximately 5 mm stone in the mid pole calyx.  Stones  appeared to be much too large for simple basketing.  As such, holmium laser energy was applied to these using settings of 0.2 joules and 20 Hz fragmenting the stone into the pieces approximately 2 mm or less in diameter.  These were then sequentially grasped with escape basket and brought out in their entirety, set aside for compositional analysis.  Repeat endoscopic examination of the right kidney x2 revealed complete resolution of all stone fragments,  larger than 1/3 mm.  Excellent hemostasis.  No evidence of perforation.  Given large volume stone bilaterally it was felt that stenting would clearly be warranted.  The sheath was removed under continuous ureteroscopic vision.  No mucosal abnormalities were seen.  Finally a new 6 x 28 contour type stent was placed over the right safety wire using cystoscopic and fluoroscopic guidance.  Good proximal and distal deployment were noted.  There was some coiling in the upper aspect within the renal pelvis.  It was felt to be still in satisfactory position.  Attention was directed to the left side.  The left ureter was cannulated with 6-French end-hole catheter and left retrograde pyelogram was obtained.  Left retrograde pyelogram demonstrated a single left ureter, single system left kidney.  No filling defects or narrowing noted.  A 0.038 zip wire was advanced to the level of upper pole and set aside as a safety wire.  Next, semi-rigid ureteroscopy was performed of the distal two- thirds of the left ureter alongside of a separate Sensor working wire up and the semi-rigid ureteroscope was exchanged for a 12/14, 38-cm ureteral access sheath to the mid ureter.  There was some tortuosity and narrowing did not allow passage of this.  Next, flexible digital ureteroscopy was performed using 8-French digital ureteroscope in the proximal left ureter, no mucosal abnormalities were found.  Inspection of the kidney x2 revealed a single 5 mm stone in the midpole calyx. Given the tortuosity and mild narrowing in the left ureter, it was felt that repeat basketing back and forth would not be ideal.  As such, the stone was managed using a dusting technique using 0.2 joules and 20 Hz fragmenting the stone into pieces of 1/3 mm or less in diameter.  The access sheath was then removed under continuous ureteroscopic vision. No mucosal abnormalities were found.  Finally, a new 6 x 26 stent was placed with  remaining safety wire.  Good proximal and distal curl were noted.  Bladder was emptied per cystoscope.  Procedure was then terminated.  The patient tolerated the procedure well.  There were no immediate periprocedural complications.  The patient was taken to the postanesthesia care unit in stable condition.          ______________________________ Sebastian Ache, MD     TM/MEDQ  D:  11/07/2014  T:  11/07/2014  Job:  [id number]

## 2014-11-07 NOTE — Anesthesia Procedure Notes (Signed)
Procedure Name: LMA Insertion Date/Time: 11/07/2014 7:37 AM Performed by: Edison PaceGRAY, Romayne Ticas E Pre-anesthesia Checklist: Patient identified, Emergency Drugs available, Suction available, Patient being monitored and Timeout performed Patient Re-evaluated:Patient Re-evaluated prior to inductionOxygen Delivery Method: Circle system utilized Preoxygenation: Pre-oxygenation with 100% oxygen Intubation Type: IV induction LMA: LMA inserted LMA Size: 4.0 Tube size: 4.0 mm Number of attempts: 1 Placement Confirmation: positive ETCO2 Tube secured with: Tape Dental Injury: Teeth and Oropharynx as per pre-operative assessment  Comments: Preexisting sore or ulceration lower right lip

## 2014-11-07 NOTE — Anesthesia Postprocedure Evaluation (Signed)
  Anesthesia Post-op Note  Patient: Caleb Potter  Procedure(s) Performed: Procedure(s) (LRB): CYSTOSCOPY WITH RETROGRADE PYELOGRAM, URETEROSCOPY AND STENT PLACEMENT (Bilateral) HOLMIUM LASER APPLICATION (Bilateral)  Patient Location: PACU  Anesthesia Type: General  Level of Consciousness: awake and alert   Airway and Oxygen Therapy: Patient Spontanous Breathing  Post-op Pain: mild  Post-op Assessment: Post-op Vital signs reviewed, Patient's Cardiovascular Status Stable, Respiratory Function Stable, Patent Airway and No signs of Nausea or vomiting  Last Vitals:  Filed Vitals:   11/07/14 1055  BP: 113/72  Pulse: 77  Temp: 36.7 C  Resp: 16    Post-op Vital Signs: stable   Complications: No apparent anesthesia complications

## 2014-11-08 ENCOUNTER — Encounter (HOSPITAL_COMMUNITY): Payer: Self-pay | Admitting: Urology

## 2016-02-27 DIAGNOSIS — E782 Mixed hyperlipidemia: Secondary | ICD-10-CM | POA: Diagnosis not present

## 2016-02-27 DIAGNOSIS — I10 Essential (primary) hypertension: Secondary | ICD-10-CM | POA: Diagnosis not present

## 2016-03-05 DIAGNOSIS — Z1212 Encounter for screening for malignant neoplasm of rectum: Secondary | ICD-10-CM | POA: Diagnosis not present

## 2016-03-05 DIAGNOSIS — E8881 Metabolic syndrome: Secondary | ICD-10-CM | POA: Diagnosis not present

## 2016-07-04 DIAGNOSIS — R739 Hyperglycemia, unspecified: Secondary | ICD-10-CM | POA: Diagnosis not present

## 2016-07-04 DIAGNOSIS — I10 Essential (primary) hypertension: Secondary | ICD-10-CM | POA: Diagnosis not present

## 2016-07-04 DIAGNOSIS — K219 Gastro-esophageal reflux disease without esophagitis: Secondary | ICD-10-CM | POA: Diagnosis not present

## 2016-07-04 DIAGNOSIS — E782 Mixed hyperlipidemia: Secondary | ICD-10-CM | POA: Diagnosis not present

## 2016-07-04 DIAGNOSIS — E8881 Metabolic syndrome: Secondary | ICD-10-CM | POA: Diagnosis not present

## 2016-07-07 DIAGNOSIS — F17210 Nicotine dependence, cigarettes, uncomplicated: Secondary | ICD-10-CM | POA: Diagnosis not present

## 2016-07-07 DIAGNOSIS — Z23 Encounter for immunization: Secondary | ICD-10-CM | POA: Diagnosis not present

## 2016-07-07 DIAGNOSIS — E782 Mixed hyperlipidemia: Secondary | ICD-10-CM | POA: Diagnosis not present

## 2016-07-07 DIAGNOSIS — E8881 Metabolic syndrome: Secondary | ICD-10-CM | POA: Diagnosis not present

## 2016-08-26 DIAGNOSIS — N451 Epididymitis: Secondary | ICD-10-CM | POA: Diagnosis not present

## 2016-08-26 DIAGNOSIS — D171 Benign lipomatous neoplasm of skin and subcutaneous tissue of trunk: Secondary | ICD-10-CM | POA: Diagnosis not present

## 2016-08-26 DIAGNOSIS — D485 Neoplasm of uncertain behavior of skin: Secondary | ICD-10-CM | POA: Diagnosis not present

## 2016-08-26 DIAGNOSIS — Z6835 Body mass index (BMI) 35.0-35.9, adult: Secondary | ICD-10-CM | POA: Diagnosis not present

## 2016-10-22 DIAGNOSIS — J111 Influenza due to unidentified influenza virus with other respiratory manifestations: Secondary | ICD-10-CM | POA: Diagnosis not present

## 2016-10-22 DIAGNOSIS — Z6835 Body mass index (BMI) 35.0-35.9, adult: Secondary | ICD-10-CM | POA: Diagnosis not present

## 2016-12-04 DIAGNOSIS — Z0001 Encounter for general adult medical examination with abnormal findings: Secondary | ICD-10-CM | POA: Diagnosis not present

## 2016-12-09 DIAGNOSIS — E8881 Metabolic syndrome: Secondary | ICD-10-CM | POA: Diagnosis not present

## 2016-12-09 DIAGNOSIS — Z1212 Encounter for screening for malignant neoplasm of rectum: Secondary | ICD-10-CM | POA: Diagnosis not present

## 2016-12-09 DIAGNOSIS — Z0001 Encounter for general adult medical examination with abnormal findings: Secondary | ICD-10-CM | POA: Diagnosis not present

## 2016-12-09 DIAGNOSIS — E782 Mixed hyperlipidemia: Secondary | ICD-10-CM | POA: Diagnosis not present

## 2016-12-09 DIAGNOSIS — F331 Major depressive disorder, recurrent, moderate: Secondary | ICD-10-CM | POA: Diagnosis not present

## 2016-12-09 DIAGNOSIS — F17210 Nicotine dependence, cigarettes, uncomplicated: Secondary | ICD-10-CM | POA: Diagnosis not present

## 2016-12-23 DIAGNOSIS — Z23 Encounter for immunization: Secondary | ICD-10-CM | POA: Diagnosis not present

## 2017-01-01 DIAGNOSIS — M10071 Idiopathic gout, right ankle and foot: Secondary | ICD-10-CM | POA: Diagnosis not present

## 2017-01-01 DIAGNOSIS — Z6834 Body mass index (BMI) 34.0-34.9, adult: Secondary | ICD-10-CM | POA: Diagnosis not present

## 2017-03-10 DIAGNOSIS — J440 Chronic obstructive pulmonary disease with acute lower respiratory infection: Secondary | ICD-10-CM | POA: Diagnosis not present

## 2017-03-10 DIAGNOSIS — Z6835 Body mass index (BMI) 35.0-35.9, adult: Secondary | ICD-10-CM | POA: Diagnosis not present

## 2017-04-03 DIAGNOSIS — I10 Essential (primary) hypertension: Secondary | ICD-10-CM | POA: Diagnosis not present

## 2017-04-03 DIAGNOSIS — E8881 Metabolic syndrome: Secondary | ICD-10-CM | POA: Diagnosis not present

## 2017-04-03 DIAGNOSIS — K219 Gastro-esophageal reflux disease without esophagitis: Secondary | ICD-10-CM | POA: Diagnosis not present

## 2017-04-03 DIAGNOSIS — E782 Mixed hyperlipidemia: Secondary | ICD-10-CM | POA: Diagnosis not present

## 2017-04-08 DIAGNOSIS — E782 Mixed hyperlipidemia: Secondary | ICD-10-CM | POA: Diagnosis not present

## 2017-04-08 DIAGNOSIS — I10 Essential (primary) hypertension: Secondary | ICD-10-CM | POA: Diagnosis not present

## 2017-04-08 DIAGNOSIS — E8881 Metabolic syndrome: Secondary | ICD-10-CM | POA: Diagnosis not present

## 2017-04-08 DIAGNOSIS — I2510 Atherosclerotic heart disease of native coronary artery without angina pectoris: Secondary | ICD-10-CM | POA: Diagnosis not present

## 2017-04-08 DIAGNOSIS — F17210 Nicotine dependence, cigarettes, uncomplicated: Secondary | ICD-10-CM | POA: Diagnosis not present

## 2017-08-07 DIAGNOSIS — E8881 Metabolic syndrome: Secondary | ICD-10-CM | POA: Diagnosis not present

## 2017-08-07 DIAGNOSIS — I10 Essential (primary) hypertension: Secondary | ICD-10-CM | POA: Diagnosis not present

## 2017-08-07 DIAGNOSIS — E782 Mixed hyperlipidemia: Secondary | ICD-10-CM | POA: Diagnosis not present

## 2017-08-07 DIAGNOSIS — J440 Chronic obstructive pulmonary disease with acute lower respiratory infection: Secondary | ICD-10-CM | POA: Diagnosis not present

## 2017-08-13 DIAGNOSIS — F17210 Nicotine dependence, cigarettes, uncomplicated: Secondary | ICD-10-CM | POA: Diagnosis not present

## 2017-08-13 DIAGNOSIS — Z23 Encounter for immunization: Secondary | ICD-10-CM | POA: Diagnosis not present

## 2017-08-13 DIAGNOSIS — I2510 Atherosclerotic heart disease of native coronary artery without angina pectoris: Secondary | ICD-10-CM | POA: Diagnosis not present

## 2017-08-13 DIAGNOSIS — E8881 Metabolic syndrome: Secondary | ICD-10-CM | POA: Diagnosis not present

## 2017-08-13 DIAGNOSIS — I10 Essential (primary) hypertension: Secondary | ICD-10-CM | POA: Diagnosis not present

## 2017-08-13 DIAGNOSIS — E782 Mixed hyperlipidemia: Secondary | ICD-10-CM | POA: Diagnosis not present

## 2017-09-01 DIAGNOSIS — Z1211 Encounter for screening for malignant neoplasm of colon: Secondary | ICD-10-CM | POA: Diagnosis not present

## 2017-09-01 DIAGNOSIS — Z1212 Encounter for screening for malignant neoplasm of rectum: Secondary | ICD-10-CM | POA: Diagnosis not present

## 2017-12-22 DIAGNOSIS — I10 Essential (primary) hypertension: Secondary | ICD-10-CM | POA: Diagnosis not present

## 2017-12-22 DIAGNOSIS — E782 Mixed hyperlipidemia: Secondary | ICD-10-CM | POA: Diagnosis not present

## 2017-12-22 DIAGNOSIS — F331 Major depressive disorder, recurrent, moderate: Secondary | ICD-10-CM | POA: Diagnosis not present

## 2017-12-22 DIAGNOSIS — R300 Dysuria: Secondary | ICD-10-CM | POA: Diagnosis not present

## 2017-12-22 DIAGNOSIS — I2510 Atherosclerotic heart disease of native coronary artery without angina pectoris: Secondary | ICD-10-CM | POA: Diagnosis not present

## 2017-12-22 DIAGNOSIS — E6609 Other obesity due to excess calories: Secondary | ICD-10-CM | POA: Diagnosis not present

## 2017-12-22 DIAGNOSIS — F17210 Nicotine dependence, cigarettes, uncomplicated: Secondary | ICD-10-CM | POA: Diagnosis not present

## 2017-12-22 DIAGNOSIS — E8881 Metabolic syndrome: Secondary | ICD-10-CM | POA: Diagnosis not present

## 2017-12-22 DIAGNOSIS — Z1212 Encounter for screening for malignant neoplasm of rectum: Secondary | ICD-10-CM | POA: Diagnosis not present

## 2017-12-22 DIAGNOSIS — Z0001 Encounter for general adult medical examination with abnormal findings: Secondary | ICD-10-CM | POA: Diagnosis not present

## 2017-12-29 DIAGNOSIS — C44329 Squamous cell carcinoma of skin of other parts of face: Secondary | ICD-10-CM | POA: Diagnosis not present

## 2017-12-29 DIAGNOSIS — L570 Actinic keratosis: Secondary | ICD-10-CM | POA: Diagnosis not present

## 2018-04-27 DIAGNOSIS — E782 Mixed hyperlipidemia: Secondary | ICD-10-CM | POA: Diagnosis not present

## 2018-04-27 DIAGNOSIS — E8881 Metabolic syndrome: Secondary | ICD-10-CM | POA: Diagnosis not present

## 2018-04-27 DIAGNOSIS — R7301 Impaired fasting glucose: Secondary | ICD-10-CM | POA: Diagnosis not present

## 2018-04-27 DIAGNOSIS — F17210 Nicotine dependence, cigarettes, uncomplicated: Secondary | ICD-10-CM | POA: Diagnosis not present

## 2018-04-27 DIAGNOSIS — I10 Essential (primary) hypertension: Secondary | ICD-10-CM | POA: Diagnosis not present

## 2018-05-05 DIAGNOSIS — N644 Mastodynia: Secondary | ICD-10-CM | POA: Diagnosis not present

## 2018-05-05 DIAGNOSIS — N62 Hypertrophy of breast: Secondary | ICD-10-CM | POA: Diagnosis not present

## 2018-05-05 DIAGNOSIS — R928 Other abnormal and inconclusive findings on diagnostic imaging of breast: Secondary | ICD-10-CM | POA: Diagnosis not present

## 2018-09-02 DIAGNOSIS — K219 Gastro-esophageal reflux disease without esophagitis: Secondary | ICD-10-CM | POA: Diagnosis not present

## 2018-09-02 DIAGNOSIS — E782 Mixed hyperlipidemia: Secondary | ICD-10-CM | POA: Diagnosis not present

## 2018-09-02 DIAGNOSIS — I10 Essential (primary) hypertension: Secondary | ICD-10-CM | POA: Diagnosis not present

## 2018-09-02 DIAGNOSIS — E8881 Metabolic syndrome: Secondary | ICD-10-CM | POA: Diagnosis not present

## 2018-09-02 DIAGNOSIS — R739 Hyperglycemia, unspecified: Secondary | ICD-10-CM | POA: Diagnosis not present

## 2018-09-08 DIAGNOSIS — Z23 Encounter for immunization: Secondary | ICD-10-CM | POA: Diagnosis not present

## 2018-09-08 DIAGNOSIS — E8881 Metabolic syndrome: Secondary | ICD-10-CM | POA: Diagnosis not present

## 2018-09-08 DIAGNOSIS — E782 Mixed hyperlipidemia: Secondary | ICD-10-CM | POA: Diagnosis not present

## 2018-09-08 DIAGNOSIS — F17210 Nicotine dependence, cigarettes, uncomplicated: Secondary | ICD-10-CM | POA: Diagnosis not present

## 2018-12-27 DIAGNOSIS — Z0001 Encounter for general adult medical examination with abnormal findings: Secondary | ICD-10-CM | POA: Diagnosis not present

## 2018-12-30 DIAGNOSIS — I2510 Atherosclerotic heart disease of native coronary artery without angina pectoris: Secondary | ICD-10-CM | POA: Diagnosis not present

## 2018-12-30 DIAGNOSIS — I10 Essential (primary) hypertension: Secondary | ICD-10-CM | POA: Diagnosis not present

## 2018-12-30 DIAGNOSIS — E8881 Metabolic syndrome: Secondary | ICD-10-CM | POA: Diagnosis not present

## 2018-12-30 DIAGNOSIS — Z1212 Encounter for screening for malignant neoplasm of rectum: Secondary | ICD-10-CM | POA: Diagnosis not present

## 2018-12-30 DIAGNOSIS — Z0001 Encounter for general adult medical examination with abnormal findings: Secondary | ICD-10-CM | POA: Diagnosis not present

## 2018-12-30 DIAGNOSIS — E782 Mixed hyperlipidemia: Secondary | ICD-10-CM | POA: Diagnosis not present

## 2019-01-05 DIAGNOSIS — J439 Emphysema, unspecified: Secondary | ICD-10-CM | POA: Diagnosis not present

## 2019-01-05 DIAGNOSIS — F17210 Nicotine dependence, cigarettes, uncomplicated: Secondary | ICD-10-CM | POA: Diagnosis not present

## 2019-01-05 DIAGNOSIS — I700 Atherosclerosis of aorta: Secondary | ICD-10-CM | POA: Diagnosis not present

## 2019-01-05 DIAGNOSIS — Z136 Encounter for screening for cardiovascular disorders: Secondary | ICD-10-CM | POA: Diagnosis not present

## 2019-01-26 DIAGNOSIS — N3289 Other specified disorders of bladder: Secondary | ICD-10-CM | POA: Diagnosis not present

## 2019-01-26 DIAGNOSIS — N200 Calculus of kidney: Secondary | ICD-10-CM | POA: Diagnosis not present

## 2019-01-26 DIAGNOSIS — N202 Calculus of kidney with calculus of ureter: Secondary | ICD-10-CM | POA: Diagnosis not present

## 2019-01-26 DIAGNOSIS — N400 Enlarged prostate without lower urinary tract symptoms: Secondary | ICD-10-CM | POA: Diagnosis not present

## 2019-01-26 DIAGNOSIS — K760 Fatty (change of) liver, not elsewhere classified: Secondary | ICD-10-CM | POA: Diagnosis not present

## 2019-01-26 DIAGNOSIS — Z6836 Body mass index (BMI) 36.0-36.9, adult: Secondary | ICD-10-CM | POA: Diagnosis not present

## 2019-01-26 DIAGNOSIS — R319 Hematuria, unspecified: Secondary | ICD-10-CM | POA: Diagnosis not present

## 2019-01-27 DIAGNOSIS — R319 Hematuria, unspecified: Secondary | ICD-10-CM | POA: Diagnosis not present

## 2019-01-27 DIAGNOSIS — Z6835 Body mass index (BMI) 35.0-35.9, adult: Secondary | ICD-10-CM | POA: Diagnosis not present

## 2019-01-27 DIAGNOSIS — N3090 Cystitis, unspecified without hematuria: Secondary | ICD-10-CM | POA: Diagnosis not present

## 2019-01-28 DIAGNOSIS — R310 Gross hematuria: Secondary | ICD-10-CM | POA: Diagnosis not present

## 2019-01-28 DIAGNOSIS — N3090 Cystitis, unspecified without hematuria: Secondary | ICD-10-CM | POA: Diagnosis not present

## 2019-01-29 DIAGNOSIS — R310 Gross hematuria: Secondary | ICD-10-CM | POA: Diagnosis not present

## 2019-01-30 ENCOUNTER — Emergency Department (HOSPITAL_COMMUNITY): Payer: BLUE CROSS/BLUE SHIELD

## 2019-01-30 ENCOUNTER — Other Ambulatory Visit: Payer: Self-pay

## 2019-01-30 ENCOUNTER — Inpatient Hospital Stay (HOSPITAL_COMMUNITY): Payer: BLUE CROSS/BLUE SHIELD

## 2019-01-30 ENCOUNTER — Inpatient Hospital Stay (HOSPITAL_COMMUNITY)
Admission: EM | Admit: 2019-01-30 | Discharge: 2019-02-13 | DRG: 653 | Disposition: A | Discharge status: Home Health | Payer: BLUE CROSS/BLUE SHIELD | Attending: Urology | Admitting: Urology

## 2019-01-30 ENCOUNTER — Encounter (HOSPITAL_COMMUNITY): Payer: Self-pay | Admitting: Emergency Medicine

## 2019-01-30 ENCOUNTER — Encounter (HOSPITAL_COMMUNITY)
Admission: EM | Disposition: A | Discharge status: Home Health | Payer: Self-pay | Source: Home / Self Care | Attending: Urology

## 2019-01-30 DIAGNOSIS — K5909 Other constipation: Secondary | ICD-10-CM | POA: Diagnosis present

## 2019-01-30 DIAGNOSIS — K567 Ileus, unspecified: Secondary | ICD-10-CM | POA: Diagnosis not present

## 2019-01-30 DIAGNOSIS — D62 Acute posthemorrhagic anemia: Secondary | ICD-10-CM | POA: Diagnosis present

## 2019-01-30 DIAGNOSIS — K9189 Other postprocedural complications and disorders of digestive system: Secondary | ICD-10-CM | POA: Diagnosis not present

## 2019-01-30 DIAGNOSIS — F419 Anxiety disorder, unspecified: Secondary | ICD-10-CM | POA: Diagnosis present

## 2019-01-30 DIAGNOSIS — E871 Hypo-osmolality and hyponatremia: Secondary | ICD-10-CM | POA: Diagnosis present

## 2019-01-30 DIAGNOSIS — Y838 Other surgical procedures as the cause of abnormal reaction of the patient, or of later complication, without mention of misadventure at the time of the procedure: Secondary | ICD-10-CM | POA: Diagnosis not present

## 2019-01-30 DIAGNOSIS — I959 Hypotension, unspecified: Secondary | ICD-10-CM | POA: Diagnosis present

## 2019-01-30 DIAGNOSIS — Z4659 Encounter for fitting and adjustment of other gastrointestinal appliance and device: Secondary | ICD-10-CM

## 2019-01-30 DIAGNOSIS — N320 Bladder-neck obstruction: Secondary | ICD-10-CM | POA: Diagnosis present

## 2019-01-30 DIAGNOSIS — N138 Other obstructive and reflux uropathy: Secondary | ICD-10-CM | POA: Diagnosis not present

## 2019-01-30 DIAGNOSIS — N200 Calculus of kidney: Secondary | ICD-10-CM | POA: Diagnosis not present

## 2019-01-30 DIAGNOSIS — Z7952 Long term (current) use of systemic steroids: Secondary | ICD-10-CM

## 2019-01-30 DIAGNOSIS — I10 Essential (primary) hypertension: Secondary | ICD-10-CM | POA: Diagnosis present

## 2019-01-30 DIAGNOSIS — Y92230 Patient room in hospital as the place of occurrence of the external cause: Secondary | ICD-10-CM | POA: Diagnosis not present

## 2019-01-30 DIAGNOSIS — N401 Enlarged prostate with lower urinary tract symptoms: Secondary | ICD-10-CM | POA: Diagnosis not present

## 2019-01-30 DIAGNOSIS — E785 Hyperlipidemia, unspecified: Secondary | ICD-10-CM | POA: Diagnosis present

## 2019-01-30 DIAGNOSIS — K8020 Calculus of gallbladder without cholecystitis without obstruction: Secondary | ICD-10-CM | POA: Diagnosis present

## 2019-01-30 DIAGNOSIS — R58 Hemorrhage, not elsewhere classified: Secondary | ICD-10-CM | POA: Diagnosis not present

## 2019-01-30 DIAGNOSIS — E861 Hypovolemia: Secondary | ICD-10-CM | POA: Diagnosis present

## 2019-01-30 DIAGNOSIS — F17210 Nicotine dependence, cigarettes, uncomplicated: Secondary | ICD-10-CM | POA: Diagnosis present

## 2019-01-30 DIAGNOSIS — Z7982 Long term (current) use of aspirin: Secondary | ICD-10-CM

## 2019-01-30 DIAGNOSIS — N3289 Other specified disorders of bladder: Secondary | ICD-10-CM | POA: Diagnosis not present

## 2019-01-30 DIAGNOSIS — S3729XA Other injury of bladder, initial encounter: Secondary | ICD-10-CM | POA: Diagnosis not present

## 2019-01-30 DIAGNOSIS — R1011 Right upper quadrant pain: Secondary | ICD-10-CM | POA: Diagnosis not present

## 2019-01-30 DIAGNOSIS — W19XXXA Unspecified fall, initial encounter: Secondary | ICD-10-CM | POA: Diagnosis present

## 2019-01-30 DIAGNOSIS — E875 Hyperkalemia: Secondary | ICD-10-CM | POA: Diagnosis not present

## 2019-01-30 DIAGNOSIS — R140 Abdominal distension (gaseous): Secondary | ICD-10-CM | POA: Diagnosis not present

## 2019-01-30 DIAGNOSIS — R0789 Other chest pain: Secondary | ICD-10-CM | POA: Diagnosis not present

## 2019-01-30 DIAGNOSIS — I252 Old myocardial infarction: Secondary | ICD-10-CM | POA: Diagnosis not present

## 2019-01-30 DIAGNOSIS — K760 Fatty (change of) liver, not elsewhere classified: Secondary | ICD-10-CM | POA: Diagnosis present

## 2019-01-30 DIAGNOSIS — F17200 Nicotine dependence, unspecified, uncomplicated: Secondary | ICD-10-CM | POA: Diagnosis not present

## 2019-01-30 DIAGNOSIS — N179 Acute kidney failure, unspecified: Secondary | ICD-10-CM | POA: Diagnosis present

## 2019-01-30 DIAGNOSIS — Z888 Allergy status to other drugs, medicaments and biological substances status: Secondary | ICD-10-CM

## 2019-01-30 DIAGNOSIS — E669 Obesity, unspecified: Secondary | ICD-10-CM | POA: Diagnosis present

## 2019-01-30 DIAGNOSIS — R188 Other ascites: Secondary | ICD-10-CM | POA: Diagnosis not present

## 2019-01-30 DIAGNOSIS — Z79899 Other long term (current) drug therapy: Secondary | ICD-10-CM

## 2019-01-30 DIAGNOSIS — J449 Chronic obstructive pulmonary disease, unspecified: Secondary | ICD-10-CM | POA: Diagnosis present

## 2019-01-30 DIAGNOSIS — R109 Unspecified abdominal pain: Secondary | ICD-10-CM | POA: Diagnosis not present

## 2019-01-30 DIAGNOSIS — E440 Moderate protein-calorie malnutrition: Secondary | ICD-10-CM | POA: Diagnosis not present

## 2019-01-30 DIAGNOSIS — Z87442 Personal history of urinary calculi: Secondary | ICD-10-CM

## 2019-01-30 DIAGNOSIS — Z4689 Encounter for fitting and adjustment of other specified devices: Secondary | ICD-10-CM

## 2019-01-30 DIAGNOSIS — T8131XA Disruption of external operation (surgical) wound, not elsewhere classified, initial encounter: Secondary | ICD-10-CM | POA: Diagnosis not present

## 2019-01-30 DIAGNOSIS — F1010 Alcohol abuse, uncomplicated: Secondary | ICD-10-CM | POA: Diagnosis present

## 2019-01-30 DIAGNOSIS — K661 Hemoperitoneum: Secondary | ICD-10-CM | POA: Diagnosis not present

## 2019-01-30 DIAGNOSIS — I2510 Atherosclerotic heart disease of native coronary artery without angina pectoris: Secondary | ICD-10-CM | POA: Diagnosis not present

## 2019-01-30 DIAGNOSIS — S01512A Laceration without foreign body of oral cavity, initial encounter: Secondary | ICD-10-CM | POA: Diagnosis present

## 2019-01-30 DIAGNOSIS — Z79891 Long term (current) use of opiate analgesic: Secondary | ICD-10-CM

## 2019-01-30 DIAGNOSIS — Z955 Presence of coronary angioplasty implant and graft: Secondary | ICD-10-CM

## 2019-01-30 DIAGNOSIS — Z931 Gastrostomy status: Secondary | ICD-10-CM | POA: Diagnosis not present

## 2019-01-30 DIAGNOSIS — R000 Tachycardia, unspecified: Secondary | ICD-10-CM | POA: Diagnosis not present

## 2019-01-30 DIAGNOSIS — Z6835 Body mass index (BMI) 35.0-35.9, adult: Secondary | ICD-10-CM

## 2019-01-30 DIAGNOSIS — R079 Chest pain, unspecified: Secondary | ICD-10-CM | POA: Diagnosis not present

## 2019-01-30 DIAGNOSIS — E1165 Type 2 diabetes mellitus with hyperglycemia: Secondary | ICD-10-CM | POA: Diagnosis not present

## 2019-01-30 HISTORY — PX: LAPAROTOMY: SHX154

## 2019-01-30 HISTORY — PX: BLADDER REPAIR: SHX6721

## 2019-01-30 LAB — PREPARE RBC (CROSSMATCH)

## 2019-01-30 LAB — CBC WITH DIFFERENTIAL/PLATELET
Abs Immature Granulocytes: 0.06 10*3/uL (ref 0.00–0.07)
Basophils Absolute: 0 10*3/uL (ref 0.0–0.1)
Basophils Relative: 0 %
Eosinophils Absolute: 0.1 10*3/uL (ref 0.0–0.5)
Eosinophils Relative: 2 %
HCT: 35.9 % — ABNORMAL LOW (ref 39.0–52.0)
Hemoglobin: 11.7 g/dL — ABNORMAL LOW (ref 13.0–17.0)
Immature Granulocytes: 1 %
Lymphocytes Relative: 21 %
Lymphs Abs: 1.4 10*3/uL (ref 0.7–4.0)
MCH: 33.4 pg (ref 26.0–34.0)
MCHC: 32.6 g/dL (ref 30.0–36.0)
MCV: 102.6 fL — ABNORMAL HIGH (ref 80.0–100.0)
Monocytes Absolute: 0.9 10*3/uL (ref 0.1–1.0)
Monocytes Relative: 14 %
Neutro Abs: 4.2 10*3/uL (ref 1.7–7.7)
Neutrophils Relative %: 62 %
Platelets: 176 10*3/uL (ref 150–400)
RBC: 3.5 MIL/uL — ABNORMAL LOW (ref 4.22–5.81)
RDW: 13.7 % (ref 11.5–15.5)
WBC: 6.7 10*3/uL (ref 4.0–10.5)
nRBC: 0 % (ref 0.0–0.2)

## 2019-01-30 LAB — PROTIME-INR
INR: 1 (ref 0.8–1.2)
Prothrombin Time: 13.4 seconds (ref 11.4–15.2)

## 2019-01-30 LAB — TROPONIN I: Troponin I: <0.03 ng/mL (ref <0.03)

## 2019-01-30 LAB — COMPREHENSIVE METABOLIC PANEL
ALT: 30 U/L (ref 0–44)
AST: 36 U/L (ref 15–41)
Albumin: 4.2 g/dL (ref 3.5–5.0)
Alkaline Phosphatase: 65 U/L (ref 38–126)
Anion gap: 12 (ref 5–15)
BUN: 64 mg/dL — ABNORMAL HIGH (ref 6–20)
CO2: 21 mmol/L — ABNORMAL LOW (ref 22–32)
Calcium: 9.4 mg/dL (ref 8.9–10.3)
Chloride: 101 mmol/L (ref 98–111)
Creatinine, Ser: 7.76 mg/dL — ABNORMAL HIGH (ref 0.61–1.24)
GFR calc Af Amer: 8 mL/min — ABNORMAL LOW (ref >60)
GFR calc non Af Amer: 7 mL/min — ABNORMAL LOW (ref >60)
Glucose, Bld: 120 mg/dL — ABNORMAL HIGH (ref 70–99)
Potassium: 6.1 mmol/L — ABNORMAL HIGH (ref 3.5–5.1)
Sodium: 134 mmol/L — ABNORMAL LOW (ref 135–145)
Total Bilirubin: 1 mg/dL (ref 0.3–1.2)
Total Protein: 6.5 g/dL (ref 6.5–8.1)

## 2019-01-30 LAB — TYPE AND SCREEN
ABO/RH(D): O POS
Antibody Screen: NEGATIVE

## 2019-01-30 LAB — CBC
HCT: 35.9 % — ABNORMAL LOW (ref 39.0–52.0)
Hemoglobin: 11.6 g/dL — ABNORMAL LOW (ref 13.0–17.0)
MCH: 32.4 pg (ref 26.0–34.0)
MCHC: 32.3 g/dL (ref 30.0–36.0)
MCV: 100.3 fL — ABNORMAL HIGH (ref 80.0–100.0)
Platelets: 142 10*3/uL — ABNORMAL LOW (ref 150–400)
RBC: 3.58 MIL/uL — ABNORMAL LOW (ref 4.22–5.81)
RDW: 14.5 % (ref 11.5–15.5)
WBC: 11.3 10*3/uL — ABNORMAL HIGH (ref 4.0–10.5)
nRBC: 0 % (ref 0.0–0.2)

## 2019-01-30 LAB — ABO/RH: ABO/RH(D): O POS

## 2019-01-30 SURGERY — LAPAROTOMY, EXPLORATORY
Anesthesia: General

## 2019-01-30 MED ORDER — FENTANYL CITRATE (PF) 100 MCG/2ML IJ SOLN
INTRAMUSCULAR | Status: AC
  Filled 2019-01-30: qty 2

## 2019-01-30 MED ORDER — SODIUM BICARBONATE 8.4 % IV SOLN
50.0000 meq | Freq: Once | INTRAVENOUS | Status: AC
  Administered 2019-01-30: 50 meq via INTRAVENOUS
  Filled 2019-01-30: qty 50

## 2019-01-30 MED ORDER — CALCIUM GLUCONATE-NACL 1-0.675 GM/50ML-% IV SOLN
1.0000 g | Freq: Once | INTRAVENOUS | Status: AC
  Administered 2019-01-30: 21:00:00 1000 mg via INTRAVENOUS
  Filled 2019-01-30: qty 50

## 2019-01-30 MED ORDER — FENTANYL CITRATE (PF) 250 MCG/5ML IJ SOLN
INTRAMUSCULAR | Status: AC
  Filled 2019-01-30: qty 5

## 2019-01-30 MED ORDER — FENTANYL CITRATE (PF) 100 MCG/2ML IJ SOLN
50.0000 ug | Freq: Once | INTRAMUSCULAR | Status: AC
  Administered 2019-01-30: 21:00:00 50 ug via INTRAVENOUS

## 2019-01-30 MED ORDER — SODIUM CHLORIDE 0.9 % IV BOLUS
1000.0000 mL | Freq: Once | INTRAVENOUS | Status: AC
  Administered 2019-01-30: 19:00:00 1000 mL via INTRAVENOUS

## 2019-01-30 MED ORDER — SODIUM CHLORIDE 0.9 % IV SOLN: 1.0000 g | Freq: Once | INTRAVENOUS | Status: DC

## 2019-01-30 MED ORDER — PROPOFOL 10 MG/ML IV BOLUS
INTRAVENOUS | Status: AC
  Filled 2019-01-30: qty 20

## 2019-01-30 MED ORDER — SODIUM CHLORIDE 0.9% IV SOLUTION: Freq: Once | INTRAVENOUS | Status: DC

## 2019-01-30 MED ORDER — SODIUM CHLORIDE 0.9 % IV BOLUS
1000.0000 mL | Freq: Once | INTRAVENOUS | Status: AC
  Administered 2019-01-30: 1000 mL via INTRAVENOUS

## 2019-01-30 MED ORDER — MIDAZOLAM HCL 2 MG/2ML IJ SOLN
INTRAMUSCULAR | Status: AC
  Filled 2019-01-30: qty 2

## 2019-01-30 SURGICAL SUPPLY — 56 items
BAG URINE DRAINAGE (UROLOGICAL SUPPLIES) ×1 IMPLANT
BLADE CLIPPER SURG (BLADE) IMPLANT
CANISTER SUCT 3000ML PPV (MISCELLANEOUS) ×3 IMPLANT
CATH FOLEY 2WAY SLVR  5CC 22FR (CATHETERS) ×1
CATH FOLEY 2WAY SLVR 5CC 22FR (CATHETERS) IMPLANT
CHLORAPREP W/TINT 26ML (MISCELLANEOUS) ×3 IMPLANT
COVER SURGICAL LIGHT HANDLE (MISCELLANEOUS) ×3 IMPLANT
COVER WAND RF STERILE (DRAPES) ×3 IMPLANT
DRAIN CHANNEL 19F RND (DRAIN) ×1 IMPLANT
DRAPE LAPAROSCOPIC ABDOMINAL (DRAPES) ×3 IMPLANT
DRAPE WARM FLUID 44X44 (DRAPE) ×3 IMPLANT
DRSG OPSITE POSTOP 4X10 (GAUZE/BANDAGES/DRESSINGS) ×1 IMPLANT
DRSG OPSITE POSTOP 4X8 (GAUZE/BANDAGES/DRESSINGS) IMPLANT
ELECT BLADE 4.0 EZ CLEAN MEGAD (MISCELLANEOUS) ×3
ELECT BLADE 6.5 EXT (BLADE) IMPLANT
ELECT CAUTERY BLADE 6.4 (BLADE) ×3 IMPLANT
ELECT REM PT RETURN 9FT ADLT (ELECTROSURGICAL) ×3
ELECTRODE BLDE 4.0 EZ CLN MEGD (MISCELLANEOUS) IMPLANT
ELECTRODE REM PT RTRN 9FT ADLT (ELECTROSURGICAL) ×2 IMPLANT
EVACUATOR SILICONE 100CC (DRAIN) ×1 IMPLANT
GAUZE SPONGE 4X4 12PLY STRL LF (GAUZE/BANDAGES/DRESSINGS) ×1 IMPLANT
GLOVE BIO SURGEON STRL SZ 6 (GLOVE) ×3 IMPLANT
GLOVE BIOGEL M STRL SZ7.5 (GLOVE) ×1 IMPLANT
GLOVE BIOGEL PI IND STRL 7.0 (GLOVE) IMPLANT
GLOVE BIOGEL PI IND STRL 8 (GLOVE) IMPLANT
GLOVE BIOGEL PI INDICATOR 7.0 (GLOVE) ×3
GLOVE BIOGEL PI INDICATOR 8 (GLOVE) ×1
GLOVE INDICATOR 6.5 STRL GRN (GLOVE) ×3 IMPLANT
GLOVE INDICATOR 8.0 STRL GRN (GLOVE) ×1 IMPLANT
GLOVE SURG SS PI 7.0 STRL IVOR (GLOVE) ×3 IMPLANT
GOWN STRL REUS W/ TWL LRG LVL3 (GOWN DISPOSABLE) ×4 IMPLANT
GOWN STRL REUS W/TWL LRG LVL3 (GOWN DISPOSABLE) ×9
KIT BASIN OR (CUSTOM PROCEDURE TRAY) ×3 IMPLANT
KIT TURNOVER KIT B (KITS) ×3 IMPLANT
LIGASURE IMPACT 36 18CM CVD LR (INSTRUMENTS) IMPLANT
NS IRRIG 1000ML POUR BTL (IV SOLUTION) ×6 IMPLANT
PACK GENERAL/GYN (CUSTOM PROCEDURE TRAY) ×3 IMPLANT
PAD ARMBOARD 7.5X6 YLW CONV (MISCELLANEOUS) ×3 IMPLANT
PENCIL SMOKE EVACUATOR (MISCELLANEOUS) ×4 IMPLANT
SLEEVE SUCTION 125 (MISCELLANEOUS) ×1 IMPLANT
SPECIMEN JAR LARGE (MISCELLANEOUS) IMPLANT
SPONGE LAP 18X18 RF (DISPOSABLE) ×2 IMPLANT
STAPLER VISISTAT 35W (STAPLE) ×4 IMPLANT
SUCTION POOLE TIP (SUCTIONS) ×3 IMPLANT
SUT ETHILON 3 0 FSL (SUTURE) ×1 IMPLANT
SUT PDS AB 1 TP1 96 (SUTURE) ×4 IMPLANT
SUT SILK 2 0 SH CR/8 (SUTURE) ×3 IMPLANT
SUT SILK 2 0 TIES 10X30 (SUTURE) ×4 IMPLANT
SUT SILK 3 0 SH CR/8 (SUTURE) ×3 IMPLANT
SUT SILK 3 0 TIES 10X30 (SUTURE) ×4 IMPLANT
SUT VIC AB 0 UR5 27 (SUTURE) ×1 IMPLANT
SUT VIC AB 2-0 UR5 27 (SUTURE) ×1 IMPLANT
SUT VIC AB 3-0 SH 18 (SUTURE) IMPLANT
SYR 10ML LL (SYRINGE) ×1 IMPLANT
TOWEL OR 17X26 10 PK STRL BLUE (TOWEL DISPOSABLE) ×3 IMPLANT
TRAY FOLEY MTR SLVR 16FR STAT (SET/KITS/TRAYS/PACK) IMPLANT

## 2019-01-30 NOTE — ED Triage Notes (Signed)
Syncopal episode at home   [place]  Now with abd pain   Complains of abd pain   Also recent UTI with appt tomorrow in [facility]

## 2019-01-30 NOTE — ED Notes (Signed)
Urology at bedside.

## 2019-01-30 NOTE — ED Provider Notes (Addendum)
  Physical Exam  BP (!) 108/54   Pulse 99   Temp 97.6 F (36.4 C) (Oral)   Resp (!) 33   Ht 5\' 6"  (1.676 m)   Wt 97.5 kg   SpO2 95%   BMI 34.70 kg/m   Physical Exam  ED Course/Procedures     Procedures  MDM  Received patient in transfer. transfer from [facility].  Had had a fall where he hit his abdomen and that was worried for free intraperitoneal fluid.  However noncontrast can done today with creatinine of 7.  Had been accepted by Dr. Cliffton Asters from trauma surgery, however he had reviewed records mourns worried that could be a bladder rupture.  Had a recent CT scan that showed large clot in the bladder that is no longer there.  He had discussed with Dr. Annabell Howells from urology.  CT cystogram recommended.  Will get done here.  Has had blood for presumed anemia from acute blood loss.  Patient now states that he had been told that his bladder had ruptured earlier this week and was going to have to see a specialist.       Benjiman Core, MD 01/30/19 2228  CT scan shows bladder rupture.  Reportedly be taken the OR.    Benjiman Core, MD 01/30/19 [phone]

## 2019-01-30 NOTE — ED Notes (Signed)
Foley inserted per MD verbal order. Upon inserting return of gross blood. Clamped after 700 mL output.

## 2019-01-30 NOTE — Anesthesia Preprocedure Evaluation (Signed)
Anesthesia Evaluation  Patient identified by MRN, date of birth, ID band Patient awake    Reviewed: Allergy & Precautions, NPO status , Patient's Chart, lab work & pertinent test results, reviewed documented beta blocker date and time   History of Anesthesia Complications Negative for: history of anesthetic complications  Airway Mallampati: II  TM Distance: >3 FB Neck ROM: Full    Dental  (+) Dental Advisory Given   Pulmonary Current Smoker,    Pulmonary exam normal breath sounds clear to auscultation       Cardiovascular Exercise Tolerance: Good hypertension, Pt. on medications and Pt. on home beta blockers + CAD and + Past MI  Normal cardiovascular exam Rhythm:Regular Rate:Normal     Neuro/Psych negative neurological ROS     GI/Hepatic Neg liver ROS, hiatal hernia,   Endo/Other  negative endocrine ROS  Renal/GU      Musculoskeletal   Abdominal   Peds  Hematology negative hematology ROS (+)   Anesthesia Other Findings   Reproductive/Obstetrics                           Anesthesia Physical  Anesthesia Plan  ASA: III  Anesthesia Plan: General   Post-op Pain Management:    Induction: Intravenous  PONV Risk Score and Plan: 3 and Ondansetron, Dexamethasone and Treatment may vary due to age or medical condition  Airway Management Planned: Oral ETT  Additional Equipment: None  Intra-op Plan:   Post-operative Plan: Extubation in OR  Informed Consent: I have reviewed the patients History and Physical, chart, labs and discussed the procedure including the risks, benefits and alternatives for the proposed anesthesia with the patient or authorized representative who has indicated his/her understanding and acceptance.     Dental advisory given  Plan Discussed with: CRNA  Anesthesia Plan Comments:        Anesthesia Quick Evaluation

## 2019-01-30 NOTE — H&P (Signed)
Subjective: CC: Hematuria.  Hx: Mr. Caleb Potter is a 58 yo WM who I was asked to see in consultation by Dr. Cliffton Asters for a bladder rupture.  The patient had the onset last Tuesday of gross hematuria.  He had CT on 4/8 that showed a distended bladder with a very large clot.  There is a small 2mm stone in the right kidney.  He was sent out with antibiotics to f/u with urology.  He continued to have bleeding and fell yesterday.  He was seen in the [facility] ED and a repeat CT showed a decompressed bladder with a large amount of free fluid in the abdomen.  He had a CT cystogram in the [facility] which shows a large bladder rupture on the dome.  He has a foley that has drained a small amount of bloody urine.  His Cr is > 7.  He has abdominal pain.   He had a hematuria w/u with cystoscopy and ureteroscopy in 2016 by Dr. Berneice Heinrich and reports some ongoing difficulty urinating.   ROS:  Review of Systems  Gastrointestinal: Positive for abdominal pain.       He thinks he may have a recurrent right inguinal hernia.   All other systems reviewed and are negative.   Allergies  Allergen Reactions  . Citalopram     Grind teeth. "high power cocaine"     Past Medical History:  Diagnosis Date  . Coronary artery disease   . History of hiatal hernia   . Hypertension   . Myocardial infarction (HCC) 2000   light MI    Past Surgical History:  Procedure Laterality Date  . CARPAL TUNNEL RELEASE  2010   right wrist  . CORONARY ANGIOPLASTY  2000  . CYST REMOVAL NECK  2008  . CYSTOSCOPY WITH RETROGRADE PYELOGRAM, URETEROSCOPY AND STENT PLACEMENT Bilateral 11/07/2014   Procedure: CYSTOSCOPY WITH RETROGRADE PYELOGRAM, URETEROSCOPY AND STENT PLACEMENT;  Surgeon: Sebastian Ache, MD;  Location: [facility];  Service: Urology;  Laterality: Bilateral;  . HERNIA REPAIR  1994   left inguinal  . HIATAL HERNIA REPAIR  2007  . HOLMIUM LASER APPLICATION Bilateral 11/07/2014   Procedure: HOLMIUM LASER APPLICATION;  Surgeon: Sebastian Ache, MD;   Location: [facility];  Service: Urology;  Laterality: Bilateral;  . right knee reconstruction  2010   right    Social History   Socioeconomic History  . Marital status: Married    Spouse name: Not on file  . Number of children: Not on file  . Years of education: Not on file  . Highest education level: Not on file  Occupational History  . Not on file  Social Needs  . Financial resource strain: Not on file  . Food insecurity:    Worry: Not on file    Inability: Not on file  . Transportation needs:    Medical: Not on file    Non-medical: Not on file  Tobacco Use  . Smoking status: Current Every Day Smoker    Packs/day: 1.00    Years: 40.00    Pack years: 40.00    Types: Cigarettes  . Smokeless tobacco: Former Engineer, water and Sexual Activity  . Alcohol use: Yes    Comment: occassionally  . Drug use: Not on file  . Sexual activity: Not on file  Lifestyle  . Physical activity:    Days per week: Not on file    Minutes per session: Not on file  . Stress: Not on file  Relationships  .  Social connections:    Talks on phone: Not on file    Gets together: Not on file    Attends religious service: Not on file    Active member of club or organization: Not on file    Attends meetings of clubs or organizations: Not on file    Relationship status: Not on file  . Intimate partner violence:    Fear of current or ex partner: Not on file    Emotionally abused: Not on file    Physically abused: Not on file    Forced sexual activity: Not on file  Other Topics Concern  . Not on file  Social History Narrative  . Not on file    No family history on file.  Anti-infectives: Anti-infectives (From admission, onward)   None      Current Facility-Administered Medications  Medication Dose Route Frequency Provider Last Rate Last Dose  . 0.9 %  sodium chloride infusion (Manually program via Guardrails IV Fluids)   Intravenous Once Donnetta Hutching, MD       Current Outpatient  Medications  Medication Sig Dispense Refill  . acetaminophen (TYLENOL) 650 MG CR tablet Take 650 mg by mouth 4 (four) times daily as needed for pain.    . Ascorbic Acid (VITAMIN C) 1000 MG tablet Take 1,000 mg by mouth daily.    Marland Kitchen aspirin EC 81 MG tablet Take 81 mg by mouth daily.    . cholecalciferol (VITAMIN D) 1000 UNITS tablet Take 1,000 Units by mouth daily.    . diazepam (VALIUM) 10 MG tablet Take 10 mg by mouth 2 (two) times daily as needed for anxiety.    Marland Kitchen lisinopril (PRINIVIL,ZESTRIL) 20 MG tablet Take 20 mg by mouth every morning.    . metoprolol succinate (TOPROL-XL) 50 MG 24 hr tablet Take 50 mg by mouth every morning. Take with or immediately following a meal.    . Omega-3 Fatty Acids (FISH OIL) 1200 MG CAPS Take 1,200 mg by mouth daily.    Marland Kitchen omeprazole (PRILOSEC) 20 MG capsule Take 20 mg by mouth daily.    Marland Kitchen oxyCODONE-acetaminophen (ROXICET) 5-325 MG per tablet Take 1-2 tablets by mouth every 6 (six) hours as needed for moderate pain or severe pain. Post-operatively 40 tablet 0  . predniSONE (DELTASONE) 5 MG tablet Take 5-10 mg by mouth daily. Will take 1 tablet daily but when it gets cold sometimes will have to take 2 because of fingers locking up.    . senna-docusate (SENOKOT-S) 8.6-50 MG per tablet Take 1 tablet by mouth 2 (two) times daily. While taking pain meds to prevent constipation 30 tablet 0  . simvastatin (ZOCOR) 80 MG tablet Take 40 mg by mouth daily.    Marland Kitchen sulfamethoxazole-trimethoprim (BACTRIM DS,SEPTRA DS) 800-160 MG per tablet Take 1 tablet by mouth 2 (two) times daily. X 3 days starting day before next Urology appointment. 6 tablet 0  . tamsulosin (FLOMAX) 0.4 MG CAPS capsule Take 0.4 mg by mouth daily.       Objective: Vital signs in last 24 hours: Temp:  [97.6 F (36.4 C)-98.1 F (36.7 C)] 97.6 F (36.4 C) (04/12 2110) Pulse Rate:  [70-99] 89 (04/12 2327) Resp:  [19-35] 27 (04/12 2327) BP: (64-108)/(51-70) 98/56 (04/12 2327) SpO2:  [90 %-97 %] 90 %  (04/12 2327) Weight:  [97.5 kg] 97.5 kg (04/12 1841)  Intake/Output from previous day: No intake/output data recorded. Intake/Output this shift: Total I/O In: -  Out: 700 [Urine:700]   Physical Exam Constitutional:  Appearance: Normal appearance. He is obese.  HENT:     Head: Normocephalic and atraumatic.  Neck:     Musculoskeletal: Normal range of motion and neck supple.  Cardiovascular:     Rate and Rhythm: Normal rate and regular rhythm.     Heart sounds: Normal heart sounds.  Pulmonary:     Effort: Pulmonary effort is normal. No respiratory distress.     Breath sounds: Normal breath sounds.  Abdominal:     General: There is distension.     Tenderness: There is abdominal tenderness.     Comments: Obese  Genitourinary:    Penis: Normal.      Scrotum/Testes: Normal.  Musculoskeletal: Normal range of motion.        General: No swelling or tenderness.  Skin:    General: Skin is warm and dry.  Neurological:     General: No focal deficit present.     Mental Status: He is alert and oriented to person, place, and time.  Psychiatric:        Mood and Affect: Mood normal.        Behavior: Behavior normal.     Lab Results:  Recent Labs    01/30/19 1920 01/30/19 2241  WBC 6.7 11.3*  HGB 11.7* 11.6*  HCT 35.9* 35.9*  PLT 176 142*   BMET Recent Labs    01/30/19 1920  NA 134*  K 6.1*  CL 101  CO2 21*  GLUCOSE 120*  BUN 64*  CREATININE 7.76*  CALCIUM 9.4   PT/INR Recent Labs    01/30/19 1920  LABPROT 13.4  INR 1.0   ABG No results for input(s): PHART, HCO3 in the last 72 hours.  Invalid input(s): PCO2, PO2  Studies/Results: Ct Abdomen Pelvis Wo Contrast  Result Date: 01/30/2019 CLINICAL DATA:  Gross hematuria.  Bilateral flank pain after fall. EXAM: CT ABDOMEN AND PELVIS WITHOUT CONTRAST TECHNIQUE: Multidetector CT imaging of the abdomen and pelvis was performed following the standard protocol without IV contrast. COMPARISON:  CT scan of January 26, 2019. FINDINGS: Lower chest: No acute abnormality. Hepatobiliary: No gallstones or biliary dilatation is noted. Mild amount of fluid is noted around the liver with dense material seen posteriorly consistent with hematoma. Pancreas: Unremarkable. No pancreatic ductal dilatation or surrounding inflammatory changes. Spleen: Fluid is noted around the spleen which was not present on prior exam and is concerning for hemorrhage. Adrenals/Urinary Tract: Adrenal glands appear normal. Nonobstructive right renal calculus is noted. No hydronephrosis or renal obstruction is noted. The amount of hemorrhage seen in urinary bladder on prior exam is significantly decreased, although residual hemorrhage does remain in the dependent portion of the bladder. Stomach/Bowel: The stomach appears normal. There is no evidence of bowel obstruction or inflammation. The appendix is not clearly visualized. Vascular/Lymphatic: Aortic atherosclerosis. No enlarged abdominal or pelvic lymph nodes. Reproductive: Stable mild prostatic enlargement is noted. Other: There does appear to be a moderate amount of hemorrhage present in the dependent portion of the pelvis as well as in the left pericolic gutter. Musculoskeletal: No acute or significant osseous findings. IMPRESSION: There is interval development of a moderate amount of intraperitoneal hemorrhage seen in the pelvis, both pericolic gutters, and around the liver and spleen. Critical Value/emergent results were called by telephone at the time of interpretation on 01/30/2019 at 8:29 pm to Dr. Terance HartKELLY GEKAS , who verbally acknowledged these results. The large amount of hemorrhage seen within the urinary bladder on prior exam has significantly improved, with only a mild  amount of residual hemorrhage or thrombus seen posteriorly in the urinary bladder on current exam. Nonobstructive right renal calculus. No hydronephrosis or renal obstruction is noted. Stable mild prostatic enlargement. Electronically  Signed   By: Lupita Raider, M.D.   On: 01/30/2019 20:30   Ct Pelvis Wo Contrast  Result Date: 01/30/2019 CLINICAL DATA:  Considerable free fluid within the abdomen on recent CT, request is been made for CT cystography. EXAM: CT PELVIS WITHOUT CONTRAST TECHNIQUE: Multidetector CT imaging of the pelvis was performed following the standard protocol without intravenous contrast. COMPARISON:  CT from earlier in the same day. FINDINGS: Urinary Tract: Contrast material is been instilled through the Foley catheter. The Foley catheter balloon is well visualized. The bladder distends well and demonstrates diffuse intraluminal filling defect consistent with residual thrombus. Additionally there is extravasation of contrast material from the dome of the bladder in a linear fashion which extends for approximately 2.9 cm consistent with bladder rupture. Contrast enhancement of the intraperitoneal fluid seen on the recent CT examination consistent with the extravasation. Bowel:  Bowel is again within normal limits. Vascular/Lymphatic: Atherosclerotic calcifications are seen. No lymphadenopathy is noted. Reproductive: Prostate is mildly prominent but stable from the recent exam. Other: Free fluid is again noted within the abdomen and pelvis related to the bladder perforation. Musculoskeletal: Stable in appearance when compare with the prior CT. IMPRESSION: CT cystography reveals a long segment of bladder discontinuity along the dome consistent with rupture. Passage of contrast material into the peritoneal cavity is seen. Retained thrombus within the bladder is noted. Electronically Signed   By: Alcide Clever M.D.   On: 01/30/2019 23:20   Dg Chest Portable 1 View  Result Date: 01/30/2019 CLINICAL DATA:  Chest pain EXAM: PORTABLE CHEST 1 VIEW COMPARISON:  11/03/2014 FINDINGS: Cardiac shadows within normal limits. Tortuosity of the thoracic aorta is identified somewhat accentuated by the portable technique and patient  rotation. The lungs are clear. No acute bony abnormality is noted. IMPRESSION: No acute abnormality noted. Electronically Signed   By: Alcide Clever M.D.   On: 01/30/2019 19:55   I have reviewed his labs and imaging and discussed his case with Dr. Cliffton Asters.   Assessment: Gross hematuria with clot retention and bladder rupture with intraperitoneal urine and AKI.   BPH with BOO.    He will be taken to the OR for X-lap with repair of the bladder rupture and drainage of the intraperitoneal urine.   Attention to the bladder to assess a cause will be made.  I have reviewed the risks in detail.    CC: Dr. Angelena Form.     Caleb Potter 01/30/2019 [phone]

## 2019-01-30 NOTE — Consult Note (Signed)
Activation and Reason: Non-level trauma; transfer from [facility] for concerns of intra-abdominal hemorrhage   Caleb Potter is an 58 y.o. male.  HPI: Caleb Potter is a 57yoM with hx of HTN, CAD/MI with stent (on asa alone), HLD, kidney stones whom presented to [facility] with worsening abodminal pain over last 4-5 days. He was evaluated there and found to have findings concerning for hemoperitoneum after pre-syncopal event at home where he was disoriented and laid down subsequently "passing out" for a short period of time. He was believed to have fallen. He was found to have a Cr of 7 (normal 1 yr ago) and therefore underwent noncontrast CT abdomen/pelvis which showed this  On arrival here, he reports a story of more pre-syncope and not a fall per se. Denies any pain aside from in his abdomen. He reports his PCP told him on Wednesday he had a "ruptured bladder" and had set him up to see Urology tomorrow. He had a CT performed 01/26/2019 which showed: distended bladder with large volume hemorrhage in the bladder. 2 mm right sided stone in collecting system as well. He states that since 4/8, he has been passing urine that looks like blood with large clots.  CT today showed   There is interval development of a moderate amount of intraperitoneal hemorrhage seen in the pelvis, both pericolic gutters, and around the liver and spleen. Critical Value/emergent results were called by telephone at the time of interpretation on 01/30/2019 at 8:29 pm to Dr. Terance HartKELLY GEKAS , who verbally acknowledged these results.  The large amount of hemorrhage seen within the urinary bladder on prior exam has significantly improved, with only a mild amount of residual hemorrhage or thrombus seen posteriorly in the urinary bladder on current exam.  Nonobstructive right renal calculus. No hydronephrosis or renal obstruction is noted.  Stable mild prostatic enlargement.  Hgb 11.7 Cr 7  He was transferred here and  underwent CT cysto which confirmed bladder rupture. Dr. Annabell HowellsWrenn has been notified  Past Medical History:  Diagnosis Date  . Coronary artery disease   . History of hiatal hernia   . Hypertension   . Myocardial infarction (HCC) 2000   light MI    Past Surgical History:  Procedure Laterality Date  . CARPAL TUNNEL RELEASE  2010   right wrist  . CORONARY ANGIOPLASTY  2000  . CYST REMOVAL NECK  2008  . CYSTOSCOPY WITH RETROGRADE PYELOGRAM, URETEROSCOPY AND STENT PLACEMENT Bilateral 11/07/2014   Procedure: CYSTOSCOPY WITH RETROGRADE PYELOGRAM, URETEROSCOPY AND STENT PLACEMENT;  Surgeon: Sebastian Acheheodore Manny, MD;  Location: [facility];  Service: Urology;  Laterality: Bilateral;  . HERNIA REPAIR  1994   left inguinal  . HIATAL HERNIA REPAIR  2007  . HOLMIUM LASER APPLICATION Bilateral 11/07/2014   Procedure: HOLMIUM LASER APPLICATION;  Surgeon: Sebastian Acheheodore Manny, MD;  Location: [facility];  Service: Urology;  Laterality: Bilateral;  . right knee reconstruction  2010   right    No family history on file.  Social History:  reports that he has been smoking cigarettes. He has a 40.00 pack-year smoking history. He has quit using smokeless tobacco. He reports current alcohol use. No history on file for drug.  Allergies:  Allergies  Allergen Reactions  . Citalopram     Grind teeth. "high power cocaine"     Medications: I have reviewed the patient's current medications.  Results for orders placed or performed during the hospital encounter of 01/30/19 (from the past 48 hour(s))  Comprehensive metabolic panel  Status: Abnormal   Collection Time: 01/30/19  7:20 PM  Result Value Ref Range   Sodium 134 (L) 135 - 145 mmol/L   Potassium 6.1 (H) 3.5 - 5.1 mmol/L   Chloride 101 98 - 111 mmol/L   CO2 21 (L) 22 - 32 mmol/L   Glucose, Bld 120 (H) 70 - 99 mg/dL   BUN 64 (H) 6 - 20 mg/dL   Creatinine, Ser 8.11 (H) 0.61 - 1.24 mg/dL   Calcium 9.4 8.9 - 91.4 mg/dL   Total Protein 6.5 6.5 - 8.1 g/dL   Albumin 4.2  3.5 - 5.0 g/dL   AST 36 15 - 41 U/L   ALT 30 0 - 44 U/L   Alkaline Phosphatase 65 38 - 126 U/L   Total Bilirubin 1.0 0.3 - 1.2 mg/dL   GFR calc non Af Amer 7 (L) >60 mL/min   GFR calc Af Amer 8 (L) >60 mL/min   Anion gap 12 5 - 15    Comment: Performed at [facility], [address] [place], [place] [address]  CBC with Differential     Status: Abnormal   Collection Time: 01/30/19  7:20 PM  Result Value Ref Range   WBC 6.7 4.0 - 10.5 K/uL   RBC 3.50 (L) 4.22 - 5.81 MIL/uL   Hemoglobin 11.7 (L) 13.0 - 17.0 g/dL   HCT 62.1 (L) 30.8 - 65.7 %   MCV 102.6 (H) 80.0 - 100.0 fL   MCH 33.4 26.0 - 34.0 pg   MCHC 32.6 30.0 - 36.0 g/dL   RDW 84.6 96.2 - 95.2 %   Platelets 176 150 - 400 K/uL   nRBC 0.0 0.0 - 0.2 %   Neutrophils Relative % 62 %   Neutro Abs 4.2 1.7 - 7.7 K/uL   Lymphocytes Relative 21 %   Lymphs Abs 1.4 0.7 - 4.0 K/uL   Monocytes Relative 14 %   Monocytes Absolute 0.9 0.1 - 1.0 K/uL   Eosinophils Relative 2 %   Eosinophils Absolute 0.1 0.0 - 0.5 K/uL   Basophils Relative 0 %   Basophils Absolute 0.0 0.0 - 0.1 K/uL   Immature Granulocytes 1 %   Abs Immature Granulocytes 0.06 0.00 - 0.07 K/uL    Comment: Performed at [facility], [address], [place], [place] [address]  Troponin I - Once     Status: None   Collection Time: 01/30/19  7:20 PM  Result Value Ref Range   Troponin I <0.03 <0.03 ng/mL    Comment: Performed at [facility], [address] [place], [place] [address]  Type and screen [facility]     Status: None (Preliminary result)   Collection Time: 01/30/19  7:20 PM  Result Value Ref Range   ABO/RH(D) O POS    Antibody Screen NEG    Sample Expiration      02/02/2019 Performed at [facility], [address], [place], [place] [address]    Unit Number [id number]    Blood Component Type RED CELLS,LR    Unit division 00    Status of Unit ISSUED    Transfusion Status OK TO TRANSFUSE    Crossmatch Result Compatible    Unit Number [id number]     Blood Component Type RED CELLS,LR    Unit division 00    Status of Unit ISSUED    Transfusion Status OK TO TRANSFUSE    Crossmatch Result Compatible   Prepare RBC     Status: None   Collection  Time: 01/30/19  7:20 PM  Result Value Ref Range   Order Confirmation      ORDER PROCESSED BY BLOOD BANK Performed at [facility], [address], [place], [place] [address]   Protime-INR     Status: None   Collection Time: 01/30/19  7:20 PM  Result Value Ref Range   Prothrombin Time 13.4 11.4 - 15.2 seconds   INR 1.0 0.8 - 1.2    Comment: (NOTE) INR goal varies based on device and disease states. Performed at [facility], [address], [place], [place] [address]   ABO/Rh     Status: None   Collection Time: 01/30/19  7:20 PM  Result Value Ref Range   ABO/RH(D)      O POS Performed at [facility], [address], [place], [place] [address]   CBC     Status: Abnormal   Collection Time: 01/30/19 10:41 PM  Result Value Ref Range   WBC 11.3 (H) 4.0 - 10.5 K/uL   RBC 3.58 (L) 4.22 - 5.81 MIL/uL   Hemoglobin 11.6 (L) 13.0 - 17.0 g/dL   HCT 91.4 (L) 78.2 - 95.6 %   MCV 100.3 (H) 80.0 - 100.0 fL   MCH 32.4 26.0 - 34.0 pg   MCHC 32.3 30.0 - 36.0 g/dL   RDW 21.3 08.6 - 57.8 %   Platelets 142 (L) 150 - 400 K/uL   nRBC 0.0 0.0 - 0.2 %    Comment: Performed at [facility], [address] [place], [place] [address]    Ct Abdomen Pelvis Wo Contrast  Result Date: 01/30/2019 CLINICAL DATA:  Gross hematuria.  Bilateral flank pain after fall. EXAM: CT ABDOMEN AND PELVIS WITHOUT CONTRAST TECHNIQUE: Multidetector CT imaging of the abdomen and pelvis was performed following the standard protocol without IV contrast. COMPARISON:  CT scan of January 26, 2019. FINDINGS: Lower chest: No acute abnormality. Hepatobiliary: No gallstones or biliary dilatation is noted. Mild amount of fluid is noted around the liver with dense material seen posteriorly consistent with hematoma. Pancreas: Unremarkable. No  pancreatic ductal dilatation or surrounding inflammatory changes. Spleen: Fluid is noted around the spleen which was not present on prior exam and is concerning for hemorrhage. Adrenals/Urinary Tract: Adrenal glands appear normal. Nonobstructive right renal calculus is noted. No hydronephrosis or renal obstruction is noted. The amount of hemorrhage seen in urinary bladder on prior exam is significantly decreased, although residual hemorrhage does remain in the dependent portion of the bladder. Stomach/Bowel: The stomach appears normal. There is no evidence of bowel obstruction or inflammation. The appendix is not clearly visualized. Vascular/Lymphatic: Aortic atherosclerosis. No enlarged abdominal or pelvic lymph nodes. Reproductive: Stable mild prostatic enlargement is noted. Other: There does appear to be a moderate amount of hemorrhage present in the dependent portion of the pelvis as well as in the left pericolic gutter. Musculoskeletal: No acute or significant osseous findings. IMPRESSION: There is interval development of a moderate amount of intraperitoneal hemorrhage seen in the pelvis, both pericolic gutters, and around the liver and spleen. Critical Value/emergent results were called by telephone at the time of interpretation on 01/30/2019 at 8:29 pm to Dr. Terance Hart , who verbally acknowledged these results. The large amount of hemorrhage seen within the urinary bladder on prior exam has significantly improved, with only a mild amount of residual hemorrhage or thrombus seen posteriorly in the urinary bladder on current exam. Nonobstructive right renal calculus. No hydronephrosis or renal obstruction is noted. Stable mild prostatic enlargement.  Electronically Signed   By: Lupita Raider, M.D.   On: 01/30/2019 20:30   Dg Chest Portable 1 View  Result Date: 01/30/2019 CLINICAL DATA:  Chest pain EXAM: PORTABLE CHEST 1 VIEW COMPARISON:  11/03/2014 FINDINGS: Cardiac shadows within normal limits. Tortuosity  of the thoracic aorta is identified somewhat accentuated by the portable technique and patient rotation. The lungs are clear. No acute bony abnormality is noted. IMPRESSION: No acute abnormality noted. Electronically Signed   By: Alcide Clever M.D.   On: 01/30/2019 19:55    Review of Systems  Constitutional: Negative for chills and fever.  HENT: Negative for hearing loss and sore throat.   Eyes: Negative for blurred vision and double vision.  Respiratory: Negative for cough and shortness of breath.   Cardiovascular: Negative for chest pain and palpitations.  Gastrointestinal: Positive for abdominal pain. Negative for nausea.  Genitourinary: Positive for flank pain, hematuria and urgency.  Musculoskeletal: Negative for myalgias and neck pain.  Skin: Negative for itching and rash.  Neurological: Negative for sensory change, speech change and headaches.  Psychiatric/Behavioral: Negative for depression and suicidal ideas.   Blood pressure (!) 108/54, pulse 99, temperature 97.6 F (36.4 C), temperature source Oral, resp. rate (!) 33, height 5\' 6"  (1.676 m), weight 97.5 kg, SpO2 95 %. Physical Exam  Constitutional: He is oriented to person, place, and time. He appears well-developed and well-nourished.  HENT:  Head: Normocephalic and atraumatic.  Right Ear: External ear normal.  Left Ear: External ear normal.  Eyes: Pupils are equal, round, and reactive to light. Conjunctivae and EOM are normal.  Neck: Normal range of motion. Neck supple.  Cardiovascular: Normal rate and regular rhythm.  Respiratory: Effort normal and breath sounds normal.  GI: Soft. He exhibits distension. There is abdominal tenderness.  Genitourinary:    Genitourinary Comments: Foley in place, draining blood   Musculoskeletal: Normal range of motion.  Neurological: He is alert and oriented to person, place, and time.  Skin: Skin is warm and dry.  Psychiatric: He has a normal mood and affect. His behavior is normal.  Judgment and thought content normal.   Assessment/Plan: -CT cysto read pending but bladder rupture evident -Do not think this is traumatic per se given hx; also does not have evidence of head trauma and his syncopal event sounds to be more pre-syncopal event -I have notified Dr. Annabell Howells of this whom is coming to evaluate him; anticipate will require exploration/repair  Stephanie Coup. Cliffton Asters, M.D. [facility] 01/30/2019, 11:05 PM

## 2019-01-30 NOTE — ED Provider Notes (Signed)
[facility] EMERGENCY DEPARTMENT Provider Note   CSN: [id number] Arrival date & time: 01/30/19  1833    History   Chief Complaint Chief Complaint  Patient presents with   Near Syncope   Fall    HPI Caleb Potter is a 58 y.o. male who presents with syncope. PMH significant for hx of MI, CAD, HTN, HLD, hx of kidney stones. He states that several days ago he started to have painful hematuria. He talked to his PCP who treated him for a UTI and placed him on Bactrim. This didn't improve his symptoms so he ordered a UA and CT scan which was done at [facility] on Friday which showed "bleeding in the bladder". He is scheduled to see a urologist but today he became lightheaded and dizzy and passed out. He doesn't remember how he fell. He states that he didn't hit his head that he knows of but can taste blood in his mouth. He reports severe, diffuse chest pain across his chest and bilateral shoulder pain. He is also having trouble breathing due to pain. He has been having diffuse abdominal pain and distention as well. He denies headache, neck pain, back pain, fever, cough, N/V/D. No blood in the stool.   HPI  Past Medical History:  Diagnosis Date   Coronary artery disease    History of hiatal hernia    Hypertension    Myocardial infarction (HCC) 2000   light MI    There are no active problems to display for this patient.   Past Surgical History:  Procedure Laterality Date   CARPAL TUNNEL RELEASE  2010   right wrist   CORONARY ANGIOPLASTY  2000   CYST REMOVAL NECK  2008   CYSTOSCOPY WITH RETROGRADE PYELOGRAM, URETEROSCOPY AND STENT PLACEMENT Bilateral 11/07/2014   Procedure: CYSTOSCOPY WITH RETROGRADE PYELOGRAM, URETEROSCOPY AND STENT PLACEMENT;  Surgeon: Sebastian Ache, MD;  Location: [facility];  Service: Urology;  Laterality: Bilateral;   HERNIA REPAIR  1994   left inguinal   HIATAL HERNIA REPAIR  2007   HOLMIUM LASER APPLICATION Bilateral 11/07/2014   Procedure:  HOLMIUM LASER APPLICATION;  Surgeon: Sebastian Ache, MD;  Location: [facility];  Service: Urology;  Laterality: Bilateral;   right knee reconstruction  2010   right        Home Medications    Prior to Admission medications   Medication Sig Start Date End Date Taking? Authorizing Provider  acetaminophen (TYLENOL) 650 MG CR tablet Take 650 mg by mouth 4 (four) times daily as needed for pain.    [provider]  Ascorbic Acid (VITAMIN C) 1000 MG tablet Take 1,000 mg by mouth daily.    [provider]  aspirin EC 81 MG tablet Take 81 mg by mouth daily.    [provider]  cholecalciferol (VITAMIN D) 1000 UNITS tablet Take 1,000 Units by mouth daily.    [provider]  diazepam (VALIUM) 10 MG tablet Take 10 mg by mouth 2 (two) times daily as needed for anxiety.    [provider]  lisinopril (PRINIVIL,ZESTRIL) 20 MG tablet Take 20 mg by mouth every morning.    [provider]  metoprolol succinate (TOPROL-XL) 50 MG 24 hr tablet Take 50 mg by mouth every morning. Take with or immediately following a meal.    [provider]  Omega-3 Fatty Acids (FISH OIL) 1200 MG CAPS Take 1,200 mg by mouth daily.    [provider]  omeprazole (PRILOSEC) 20 MG capsule Take  20 mg by mouth daily.    [provider]  oxyCODONE-acetaminophen (ROXICET) 5-325 MG per tablet Take 1-2 tablets by mouth every 6 (six) hours as needed for moderate pain or severe pain. Post-operatively 11/07/14   Sebastian Ache, MD  predniSONE (DELTASONE) 5 MG tablet Take 5-10 mg by mouth daily. Will take 1 tablet daily but when it gets cold sometimes will have to take 2 because of fingers locking up.    [provider]  senna-docusate (SENOKOT-S) 8.6-50 MG per tablet Take 1 tablet by mouth 2 (two) times daily. While taking pain meds to prevent constipation 11/07/14   Sebastian Ache, MD  simvastatin (ZOCOR) 80 MG tablet Take 40 mg by mouth daily.     [provider]  sulfamethoxazole-trimethoprim (BACTRIM DS,SEPTRA DS) 800-160 MG per tablet Take 1 tablet by mouth 2 (two) times daily. X 3 days starting day before next Urology appointment. 11/07/14   Sebastian Ache, MD  tamsulosin (FLOMAX) 0.4 MG CAPS capsule Take 0.4 mg by mouth daily.    [provider]    Family History No family history on file.  Social History Social History   Tobacco Use   Smoking status: Current Every Day Smoker    Packs/day: 1.00    Years: 40.00    Pack years: 40.00    Types: Cigarettes   Smokeless tobacco: Former Neurosurgeon  Substance Use Topics   Alcohol use: Yes    Comment: occassionally   Drug use: Not on file     Allergies   Citalopram   Review of Systems Review of Systems  Constitutional: Negative for fever.  Respiratory: Positive for shortness of breath.   Cardiovascular: Positive for chest pain.  Gastrointestinal: Positive for abdominal distention and abdominal pain. Negative for blood in stool, diarrhea, nausea and vomiting.  Genitourinary: Positive for hematuria. Negative for flank pain.  Musculoskeletal: Positive for arthralgias. Negative for back pain and neck pain.  Neurological: Positive for syncope. Negative for headaches.  All other systems reviewed and are negative.    Physical Exam Updated Vital Signs BP 90/70 (BP Location: Right Arm)    Pulse 78    Temp 98.1 F (36.7 C) (Oral)    Resp (!) 24    Ht  (1.676 m)    Wt 97.5 kg    SpO2 96%    BMI 34.70 kg/m   Physical Exam Vitals signs and nursing note reviewed.  Constitutional:      General: He is in acute distress (moaning in pain).     Appearance: Normal appearance. He is well-developed. He is obese.  HENT:     Head: Normocephalic and atraumatic.     Mouth/Throat:     Comments: Small tongue laceration Eyes:     General: No scleral icterus.       Right eye: No discharge.        Left eye: No discharge.     Conjunctiva/sclera: Conjunctivae  normal.     Pupils: Pupils are equal, round, and reactive to light.  Neck:     Musculoskeletal: Normal range of motion.  Cardiovascular:     Rate and Rhythm: Normal rate and regular rhythm.  Pulmonary:     Effort: Pulmonary effort is normal. No respiratory distress.     Breath sounds: Decreased air movement present.  Chest:     Chest wall: Tenderness (diffuse lower chest tenderness, mostly over right lower rib cage) present.  Abdominal:     General: There is distension.  Tenderness: There is abdominal tenderness (diffuse).  Skin:    General: Skin is warm and dry.  Neurological:     Mental Status: He is alert and oriented to person, place, and time.  Psychiatric:        Behavior: Behavior normal.      ED Treatments / Results  Labs (all labs ordered are listed, but only abnormal results are displayed) Labs Reviewed  COMPREHENSIVE METABOLIC PANEL - Abnormal; Notable for the following components:      Result Value   Sodium 134 (*)    Potassium 6.1 (*)    CO2 21 (*)    Glucose, Bld 120 (*)    BUN 64 (*)    Creatinine, Ser 7.76 (*)    GFR calc non Af Amer 7 (*)    GFR calc Af Amer 8 (*)    All other components within normal limits  CBC WITH DIFFERENTIAL/PLATELET - Abnormal; Notable for the following components:   RBC 3.50 (*)    Hemoglobin 11.7 (*)    HCT 35.9 (*)    MCV 102.6 (*)    All other components within normal limits  TROPONIN I  URINALYSIS, ROUTINE W REFLEX MICROSCOPIC  TYPE AND SCREEN    EKG EKG Interpretation  Date/Time:  Sunday January 30 2019 18:42:33 EDT Ventricular Rate:  77 PR Interval:    QRS Duration: 100 QT Interval:  365 QTC Calculation: 413 R Axis:   -55 Text Interpretation:  Sinus rhythm Low voltage, extremity and precordial leads Abnormal R-wave progression, early transition Baseline wander in lead(s) V1 V2 V4 Confirmed by Raeford Razor [id number] on 01/30/2019 6:46:51 PM   Radiology Ct Abdomen Pelvis Wo Contrast  Result Date:  01/30/2019 CLINICAL DATA:  Gross hematuria.  Bilateral flank pain after fall. EXAM: CT ABDOMEN AND PELVIS WITHOUT CONTRAST TECHNIQUE: Multidetector CT imaging of the abdomen and pelvis was performed following the standard protocol without IV contrast. COMPARISON:  CT scan of January 26, 2019. FINDINGS: Lower chest: No acute abnormality. Hepatobiliary: No gallstones or biliary dilatation is noted. Mild amount of fluid is noted around the liver with dense material seen posteriorly consistent with hematoma. Pancreas: Unremarkable. No pancreatic ductal dilatation or surrounding inflammatory changes. Spleen: Fluid is noted around the spleen which was not present on prior exam and is concerning for hemorrhage. Adrenals/Urinary Tract: Adrenal glands appear normal. Nonobstructive right renal calculus is noted. No hydronephrosis or renal obstruction is noted. The amount of hemorrhage seen in urinary bladder on prior exam is significantly decreased, although residual hemorrhage does remain in the dependent portion of the bladder. Stomach/Bowel: The stomach appears normal. There is no evidence of bowel obstruction or inflammation. The appendix is not clearly visualized. Vascular/Lymphatic: Aortic atherosclerosis. No enlarged abdominal or pelvic lymph nodes. Reproductive: Stable mild prostatic enlargement is noted. Other: There does appear to be a moderate amount of hemorrhage present in the dependent portion of the pelvis as well as in the left pericolic gutter. Musculoskeletal: No acute or significant osseous findings. IMPRESSION: There is interval development of a moderate amount of intraperitoneal hemorrhage seen in the pelvis, both pericolic gutters, and around the liver and spleen. Critical Value/emergent results were called by telephone at the time of interpretation on 01/30/2019 at 8:29 pm to Dr. Terance Hart , who verbally acknowledged these results. The large amount of hemorrhage seen within the urinary bladder on prior  exam has significantly improved, with only a mild amount of residual hemorrhage or thrombus seen posteriorly in the urinary bladder on  current exam. Nonobstructive right renal calculus. No hydronephrosis or renal obstruction is noted. Stable mild prostatic enlargement. Electronically Signed   By: Lupita Raider, M.D.   On: 01/30/2019 20:30   Dg Chest Portable 1 View  Result Date: 01/30/2019 CLINICAL DATA:  Chest pain EXAM: PORTABLE CHEST 1 VIEW COMPARISON:  11/03/2014 FINDINGS: Cardiac shadows within normal limits. Tortuosity of the thoracic aorta is identified somewhat accentuated by the portable technique and patient rotation. The lungs are clear. No acute bony abnormality is noted. IMPRESSION: No acute abnormality noted. Electronically Signed   By: Alcide Clever M.D.   On: 01/30/2019 19:55    Procedures Procedures (including critical care time)  CRITICAL CARE Performed by: Bethel Born   Total critical care time: 40 minutes  Critical care time was exclusive of separately billable procedures and treating other patients.  Critical care was necessary to treat or prevent imminent or life-threatening deterioration.  Critical care was time spent personally by me on the following activities: development of treatment plan with patient and/or surrogate as well as nursing, discussions with consultants, evaluation of patient's response to treatment, examination of patient, obtaining history from patient or surrogate, ordering and performing treatments and interventions, ordering and review of laboratory studies, ordering and review of radiographic studies, pulse oximetry and re-evaluation of patient's condition.   Medications Ordered in ED Medications  calcium gluconate 1 g/ 50 mL sodium chloride IVPB (1,000 mg Intravenous New Bag/Given 01/30/19 2040)  0.9 %  sodium chloride infusion (Manually program via Guardrails IV Fluids) (has no administration in time range)  sodium chloride 0.9 % bolus  1,000 mL (0 mLs Intravenous Stopped 01/30/19 2041)  sodium bicarbonate injection 50 mEq (50 mEq Intravenous Given 01/30/19 2015)  sodium chloride 0.9 % bolus 1,000 mL (1,000 mLs Intravenous New Bag/Given 01/30/19 2021)  sodium chloride 0.9 % bolus 1,000 mL (1,000 mLs Intravenous New Bag/Given 01/30/19 2059)  fentaNYL (SUBLIMAZE) injection 50 mcg (50 mcg Intravenous Given 01/30/19 2112)     Initial Impression / Assessment and Plan / ED Course  I have reviewed the triage vital signs and the nursing notes.  Pertinent labs & imaging results that were available during my care of the patient were reviewed by me and considered in my medical decision making (see chart for details).  59PM 58 year old male presents with chest/abdominal pain after a syncopal episode earlier tonight. He has had hematuria and bleeding in the bladder for several days. He is hypotensive but otherwise vitals are normal. On exam heart is regular rate and rhythm. Lungs are CTA. Abdomen is soft and generally tender. He is in a significant amount of discomfort. Will order labs, CXR, CT abdomen/pelvis. Shared visit with Dr. Adriana Simas. 1L fluid bolus ordered for soft bp.  7:20 PM CBC is remarkable for mild anemia (11.7). CMP is remarkable for hyponatremia (134), hyperkalemia (6.1), significant AKI with SCr 7.76. 1 amp bicarb and Ca gluconate ordered. CXR is negative. EKG is SR. Trop is 0. CT is pending.  8PM Discussed with radiologist that pt has intraperitoneal hemorrhage in the pelvis and around the liver and spleen. BP is worsening - 70/50s. Another fluid bolus ordered. Dr. Adriana Simas ordered emergent release pRBCs.  Discussed with Dr. Lovell Sheehan with general surgery, he recommends transfer to [facility]. He will go by [facility] due to [facility] being backlogged from COVID.  8:58 PM Dr. Adriana Simas discussed with Dr. Cliffton Asters with Trauma. BP is somewhat improved - last reading is 94/57. He has had a total  of 3L of fluid. [facility] I discussed with the patient's daughter Marcelino DusterMichelle to update her on the plan of care.   Final Clinical Impressions(s) / ED Diagnoses   Final diagnoses:  Intraperitoneal hemorrhage  Acute renal failure, unspecified acute renal failure type [facility](HCC)    ED Discharge Orders    None       Beryle QuantGekas, Khailee Mick Marie, PA-C 01/30/19 2129    Donnetta Hutchingook, Brian, MD 01/30/19 2202

## 2019-01-30 NOTE — ED Notes (Signed)
Pt's daughter, Marcelino Duster, can be reached at [phone].  States she will be best contact as pt's wife has had a stroke and is difficult to understand at times.  Relays the pt has a UTI and is being tx with a sulfa antibiotic, apparently got lightheaded today and fell from what she understands was relayed to her.

## 2019-01-30 NOTE — ED Notes (Signed)
ED TO INPATIENT HANDOFF REPORT  ED Nurse Name and Phone #: [phone]  S Name/Age/Gender Caleb Potter 58 y.o. male Room/Bed: TRACC/TRACC  Code Status   Code Status: Not on file  Home/SNF/Other Home Patient oriented to: self, place, time and situation Is this baseline? Yes   Triage Complete: Triage complete  Chief Complaint Fall  Triage Note Syncopal episode at home   [facility]  Now with abd pain   Complains of abd pain   Also recent UTI with appt tomorrow in [facility]   Allergies Allergies  Allergen Reactions  . Citalopram     Grind teeth. "high power cocaine"     Level of Care/Admitting Diagnosis ED Disposition    ED Disposition Condition Comment   Admit  Hospital Area: [facility] [[id number]]  Level of Care: ICU [6]  Diagnosis: Intraperitoneal hematoma [[id number]]  Admitting Physician: Andria Meuse [[id number]]  Attending Physician: Andria Meuse [[id number]]  Estimated length of stay: 3 - 4 days  Certification:: I certify this patient will need inpatient services for at least 2 midnights  PT Class (Do Not Modify): Inpatient [101]  PT Acc Code (Do Not Modify): Private [1]       B Medical/Surgery History Past Medical History:  Diagnosis Date  . Coronary artery disease   . History of hiatal hernia   . Hypertension   . Myocardial infarction (HCC) 2000   light MI   Past Surgical History:  Procedure Laterality Date  . CARPAL TUNNEL RELEASE  2010   right wrist  . CORONARY ANGIOPLASTY  2000  . CYST REMOVAL NECK  2008  . CYSTOSCOPY WITH RETROGRADE PYELOGRAM, URETEROSCOPY AND STENT PLACEMENT Bilateral 11/07/2014   Procedure: CYSTOSCOPY WITH RETROGRADE PYELOGRAM, URETEROSCOPY AND STENT PLACEMENT;  Surgeon: Sebastian Ache, MD;  Location: [facility];  Service: Urology;  Laterality: Bilateral;  . HERNIA REPAIR  1994   left inguinal  . HIATAL HERNIA REPAIR  2007  . HOLMIUM LASER APPLICATION Bilateral 11/07/2014   Procedure: HOLMIUM  LASER APPLICATION;  Surgeon: Sebastian Ache, MD;  Location: [facility];  Service: Urology;  Laterality: Bilateral;  . right knee reconstruction  2010   right     A IV Location/Drains/Wounds Patient Lines/Drains/Airways Status   Active Line/Drains/Airways    Name:   Placement date:   Placement time:   Site:   Days:   Peripheral IV 01/30/19 Right Antecubital   01/30/19    1916    Antecubital   less than 1   Peripheral IV 01/30/19 Left Antecubital   01/30/19    2100    Antecubital   less than 1   Peripheral IV 01/30/19 Left Hand   01/30/19    2108    Hand   less than 1   Urethral Catheter Aleksa K. 16 Fr.   01/30/19    2218    -   less than 1   Ureteral Drain/Stent Right ureter 6 Fr.   11/07/14    0828    Right ureter   1545   Ureteral Drain/Stent Left ureter 6 Fr.   11/07/14    0911    Left ureter   1545   Incision (Closed) 11/07/14 Perineum Other (Comment)   11/07/14    0755     1545          Intake/Output Last 24 hours  Intake/Output Summary (Last 24 hours) at 01/30/2019 2308 Last data filed at 01/30/2019 2220 Gross per 24 hour  Intake -  Output  700 ml  Net -700 ml    Labs/Imaging Results for orders placed or performed during the hospital encounter of 01/30/19 (from the past 48 hour(s))  Comprehensive metabolic panel     Status: Abnormal   Collection Time: 01/30/19  7:20 PM  Result Value Ref Range   Sodium 134 (L) 135 - 145 mmol/L   Potassium 6.1 (H) 3.5 - 5.1 mmol/L   Chloride 101 98 - 111 mmol/L   CO2 21 (L) 22 - 32 mmol/L   Glucose, Bld 120 (H) 70 - 99 mg/dL   BUN 64 (H) 6 - 20 mg/dL   Creatinine, Ser 4.09 (H) 0.61 - 1.24 mg/dL   Calcium 9.4 8.9 - 81.1 mg/dL   Total Protein 6.5 6.5 - 8.1 g/dL   Albumin 4.2 3.5 - 5.0 g/dL   AST 36 15 - 41 U/L   ALT 30 0 - 44 U/L   Alkaline Phosphatase 65 38 - 126 U/L   Total Bilirubin 1.0 0.3 - 1.2 mg/dL   GFR calc non Af Amer 7 (L) >60 mL/min   GFR calc Af Amer 8 (L) >60 mL/min   Anion gap 12 5 - 15    Comment: Performed at [facility], [address], [place], [place] [address]  CBC with Differential     Status: Abnormal   Collection Time: 01/30/19  7:20 PM  Result Value Ref Range   WBC 6.7 4.0 - 10.5 K/uL   RBC 3.50 (L) 4.22 - 5.81 MIL/uL   Hemoglobin 11.7 (L) 13.0 - 17.0 g/dL   HCT 29.5 (L) 62.1 - 30.8 %   MCV 102.6 (H) 80.0 - 100.0 fL   MCH 33.4 26.0 - 34.0 pg   MCHC 32.6 30.0 - 36.0 g/dL   RDW 65.7 84.6 - 96.2 %   Platelets 176 150 - 400 K/uL   nRBC 0.0 0.0 - 0.2 %   Neutrophils Relative % 62 %   Neutro Abs 4.2 1.7 - 7.7 K/uL   Lymphocytes Relative 21 %   Lymphs Abs 1.4 0.7 - 4.0 K/uL   Monocytes Relative 14 %   Monocytes Absolute 0.9 0.1 - 1.0 K/uL   Eosinophils Relative 2 %   Eosinophils Absolute 0.1 0.0 - 0.5 K/uL   Basophils Relative 0 %   Basophils Absolute 0.0 0.0 - 0.1 K/uL   Immature Granulocytes 1 %   Abs Immature Granulocytes 0.06 0.00 - 0.07 K/uL    Comment: Performed at [facility], [address], [place], [place] [address]  Troponin I - Once     Status: None   Collection Time: 01/30/19  7:20 PM  Result Value Ref Range   Troponin I <0.03 <0.03 ng/mL    Comment: Performed at [facility], [address] [place], [place] [address]  Type and screen [facility]     Status: None (Preliminary result)   Collection Time: 01/30/19  7:20 PM  Result Value Ref Range   ABO/RH(D) O POS    Antibody Screen NEG    Sample Expiration      02/02/2019 Performed at [facility], [address] [place], [place] [address]    Unit Number [id number]    Blood Component Type RED CELLS,LR    Unit division 00    Status of Unit ISSUED    Transfusion Status OK TO TRANSFUSE    Crossmatch Result Compatible    Unit Number [id number]    Blood Component Type RED CELLS,LR    Unit division 00  Status of Unit ISSUED    Transfusion Status OK TO TRANSFUSE    Crossmatch Result Compatible   Prepare RBC     Status: None   Collection Time: 01/30/19  7:20 PM  Result Value Ref Range   Order  Confirmation      ORDER PROCESSED BY BLOOD BANK Performed at [facility], [address] [place], [place] [address]   Protime-INR     Status: None   Collection Time: 01/30/19  7:20 PM  Result Value Ref Range   Prothrombin Time 13.4 11.4 - 15.2 seconds   INR 1.0 0.8 - 1.2    Comment: (NOTE) INR goal varies based on device and disease states. Performed at [facility], [address], [place], [place] [address]   ABO/Rh     Status: None   Collection Time: 01/30/19  7:20 PM  Result Value Ref Range   ABO/RH(D)      O POS Performed at [facility], [address], [place], [place] [address]   CBC     Status: Abnormal   Collection Time: 01/30/19 10:41 PM  Result Value Ref Range   WBC 11.3 (H) 4.0 - 10.5 K/uL   RBC 3.58 (L) 4.22 - 5.81 MIL/uL   Hemoglobin 11.6 (L) 13.0 - 17.0 g/dL   HCT 71.0 (L) 62.6 - 94.8 %   MCV 100.3 (H) 80.0 - 100.0 fL   MCH 32.4 26.0 - 34.0 pg   MCHC 32.3 30.0 - 36.0 g/dL   RDW 54.6 27.0 - 35.0 %   Platelets 142 (L) 150 - 400 K/uL   nRBC 0.0 0.0 - 0.2 %    Comment: Performed at [facility], [address], [place], [place] [address]   Ct Abdomen Pelvis Wo Contrast  Result Date: 01/30/2019 CLINICAL DATA:  Gross hematuria.  Bilateral flank pain after fall. EXAM: CT ABDOMEN AND PELVIS WITHOUT CONTRAST TECHNIQUE: Multidetector CT imaging of the abdomen and pelvis was performed following the standard protocol without IV contrast. COMPARISON:  CT scan of January 26, 2019. FINDINGS: Lower chest: No acute abnormality. Hepatobiliary: No gallstones or biliary dilatation is noted. Mild amount of fluid is noted around the liver with dense material seen posteriorly consistent with hematoma. Pancreas: Unremarkable. No pancreatic ductal dilatation or surrounding inflammatory changes. Spleen: Fluid is noted around the spleen which was not present on prior exam and is concerning for hemorrhage. Adrenals/Urinary Tract: Adrenal glands appear normal. Nonobstructive right renal  calculus is noted. No hydronephrosis or renal obstruction is noted. The amount of hemorrhage seen in urinary bladder on prior exam is significantly decreased, although residual hemorrhage does remain in the dependent portion of the bladder. Stomach/Bowel: The stomach appears normal. There is no evidence of bowel obstruction or inflammation. The appendix is not clearly visualized. Vascular/Lymphatic: Aortic atherosclerosis. No enlarged abdominal or pelvic lymph nodes. Reproductive: Stable mild prostatic enlargement is noted. Other: There does appear to be a moderate amount of hemorrhage present in the dependent portion of the pelvis as well as in the left pericolic gutter. Musculoskeletal: No acute or significant osseous findings. IMPRESSION: There is interval development of a moderate amount of intraperitoneal hemorrhage seen in the pelvis, both pericolic gutters, and around the liver and spleen. Critical Value/emergent results were called by telephone at the time of interpretation on 01/30/2019 at 8:29 pm to Dr. Terance Hart , who verbally acknowledged these results. The large amount of hemorrhage seen within the urinary bladder on prior exam has significantly improved, with only a  mild amount of residual hemorrhage or thrombus seen posteriorly in the urinary bladder on current exam. Nonobstructive right renal calculus. No hydronephrosis or renal obstruction is noted. Stable mild prostatic enlargement. Electronically Signed   By: Lupita RaiderJames  Green Jr, M.D.   On: 01/30/2019 20:30   Dg Chest Portable 1 View  Result Date: 01/30/2019 CLINICAL DATA:  Chest pain EXAM: PORTABLE CHEST 1 VIEW COMPARISON:  11/03/2014 FINDINGS: Cardiac shadows within normal limits. Tortuosity of the thoracic aorta is identified somewhat accentuated by the portable technique and patient rotation. The lungs are clear. No acute bony abnormality is noted. IMPRESSION: No acute abnormality noted. Electronically Signed   By: Alcide CleverMark  Lukens M.D.   On:  01/30/2019 19:55    Pending Labs Unresulted Labs (From admission, onward)    Start     Ordered   01/30/19 2200  Type and screen [facility]  Once,   STAT    Comments:  [facility]    01/30/19 2159   01/30/19 1900  Urinalysis, Routine w reflex microscopic  ONCE - STAT,   STAT     01/30/19 1859          Vitals/Pain Today's Vitals   01/30/19 2110 01/30/19 2200 01/30/19 2215 01/30/19 2216  BP:  (!) 83/53 (!) 108/54   Pulse:  89 99   Resp:  (!) 35 (!) 33   Temp: 97.6 F (36.4 C)     TempSrc: Oral     SpO2:  94% 95%   Weight:      Height:      PainSc:    10-Worst pain ever    Isolation Precautions No active isolations  Medications Medications  0.9 %  sodium chloride infusion (Manually program via Guardrails IV Fluids) (has no administration in time range)  sodium chloride 0.9 % bolus 1,000 mL (0 mLs Intravenous Stopped 01/30/19 2041)  sodium bicarbonate injection 50 mEq (50 mEq Intravenous Given 01/30/19 2015)  sodium chloride 0.9 % bolus 1,000 mL (1,000 mLs Intravenous New Bag/Given 01/30/19 2021)  calcium gluconate 1 g/ 50 mL sodium chloride IVPB (1,000 mg Intravenous New Bag/Given 01/30/19 2040)  sodium chloride 0.9 % bolus 1,000 mL (1,000 mLs Intravenous New Bag/Given 01/30/19 2059)  fentaNYL (SUBLIMAZE) injection 50 mcg (50 mcg Intravenous Given 01/30/19 2112)    Mobility walks     Focused Assessments Gastrointestinal   R Recommendations: See Admitting Provider Note  Report given to:   Additional Notes:

## 2019-01-31 ENCOUNTER — Inpatient Hospital Stay (HOSPITAL_COMMUNITY): Payer: BLUE CROSS/BLUE SHIELD | Admitting: Certified Registered Nurse Anesthetist

## 2019-01-31 ENCOUNTER — Encounter (HOSPITAL_COMMUNITY): Payer: Self-pay | Admitting: Family Medicine

## 2019-01-31 DIAGNOSIS — F419 Anxiety disorder, unspecified: Secondary | ICD-10-CM | POA: Diagnosis present

## 2019-01-31 DIAGNOSIS — I2510 Atherosclerotic heart disease of native coronary artery without angina pectoris: Secondary | ICD-10-CM | POA: Diagnosis present

## 2019-01-31 DIAGNOSIS — I10 Essential (primary) hypertension: Secondary | ICD-10-CM | POA: Diagnosis present

## 2019-01-31 DIAGNOSIS — N3289 Other specified disorders of bladder: Secondary | ICD-10-CM | POA: Diagnosis present

## 2019-01-31 DIAGNOSIS — I252 Old myocardial infarction: Secondary | ICD-10-CM | POA: Diagnosis not present

## 2019-01-31 DIAGNOSIS — N179 Acute kidney failure, unspecified: Secondary | ICD-10-CM | POA: Diagnosis present

## 2019-01-31 DIAGNOSIS — F17200 Nicotine dependence, unspecified, uncomplicated: Secondary | ICD-10-CM | POA: Diagnosis not present

## 2019-01-31 DIAGNOSIS — E875 Hyperkalemia: Secondary | ICD-10-CM | POA: Diagnosis present

## 2019-01-31 LAB — TYPE AND SCREEN
ABO/RH(D): O POS
Antibody Screen: NEGATIVE
Unit division: 0
Unit division: 0

## 2019-01-31 LAB — BASIC METABOLIC PANEL
Anion gap: 11 (ref 5–15)
Anion gap: 8 (ref 5–15)
BUN: 59 mg/dL — ABNORMAL HIGH (ref 6–20)
BUN: 47 mg/dL — ABNORMAL HIGH (ref 6–20)
CO2: 19 mmol/L — ABNORMAL LOW (ref 22–32)
CO2: 23 mmol/L (ref 22–32)
Calcium: 8 mg/dL — ABNORMAL LOW (ref 8.9–10.3)
Calcium: 8.2 mg/dL — ABNORMAL LOW (ref 8.9–10.3)
Chloride: 105 mmol/L (ref 98–111)
Chloride: 105 mmol/L (ref 98–111)
Creatinine, Ser: 6.41 mg/dL — ABNORMAL HIGH (ref 0.61–1.24)
Creatinine, Ser: 4.52 mg/dL — ABNORMAL HIGH (ref 0.61–1.24)
GFR calc Af Amer: 10 mL/min — ABNORMAL LOW (ref >60)
GFR calc Af Amer: 15 mL/min — ABNORMAL LOW (ref >60)
GFR calc non Af Amer: 9 mL/min — ABNORMAL LOW (ref >60)
GFR calc non Af Amer: 13 mL/min — ABNORMAL LOW (ref >60)
Glucose, Bld: 147 mg/dL — ABNORMAL HIGH (ref 70–99)
Glucose, Bld: 86 mg/dL (ref 70–99)
Potassium: 6.6 mmol/L (ref 3.5–5.1)
Potassium: 5.1 mmol/L (ref 3.5–5.1)
Sodium: 135 mmol/L (ref 135–145)
Sodium: 136 mmol/L (ref 135–145)

## 2019-01-31 LAB — SODIUM, URINE, RANDOM: Sodium, Ur: 142 mmol/L

## 2019-01-31 LAB — HEMOGLOBIN AND HEMATOCRIT, BLOOD
HCT: 38.5 % — ABNORMAL LOW (ref 39.0–52.0)
Hemoglobin: 12.5 g/dL — ABNORMAL LOW (ref 13.0–17.0)

## 2019-01-31 LAB — URINALYSIS, COMPLETE (UACMP) WITH MICROSCOPIC
Bacteria, UA: NONE SEEN
Bilirubin Urine: NEGATIVE
Glucose, UA: 50 mg/dL — AB
Ketones, ur: NEGATIVE mg/dL
Leukocytes,Ua: NEGATIVE
Nitrite: NEGATIVE
Protein, ur: NEGATIVE mg/dL
RBC / HPF: >50 RBC/hpf — ABNORMAL HIGH (ref 0–5)
Specific Gravity, Urine: 1.015 (ref 1.005–1.030)
pH: 5 (ref 5.0–8.0)

## 2019-01-31 LAB — GLUCOSE, CAPILLARY
Glucose-Capillary: 126 mg/dL — ABNORMAL HIGH (ref 70–99)
Glucose-Capillary: 131 mg/dL — ABNORMAL HIGH (ref 70–99)
Glucose-Capillary: 60 mg/dL — ABNORMAL LOW (ref 70–99)
Glucose-Capillary: 60 mg/dL — ABNORMAL LOW (ref 70–99)

## 2019-01-31 LAB — BPAM RBC
Blood Product Expiration Date: 202004302359
Blood Product Expiration Date: 202004302359
ISSUE DATE / TIME: 202004122053
ISSUE DATE / TIME: 202004122053
Unit Type and Rh: 5100
Unit Type and Rh: 5100

## 2019-01-31 LAB — HIV ANTIBODY (ROUTINE TESTING W REFLEX): HIV Screen 4th Generation wRfx: NONREACTIVE

## 2019-01-31 LAB — CREATININE, URINE, RANDOM: Creatinine, Urine: 94.42 mg/dL

## 2019-01-31 LAB — MRSA PCR SCREENING: MRSA by PCR: NEGATIVE

## 2019-01-31 LAB — POTASSIUM
Potassium: 4.8 mmol/L (ref 3.5–5.1)
Potassium: 5 mmol/L (ref 3.5–5.1)

## 2019-01-31 LAB — ABO/RH: ABO/RH(D): O POS

## 2019-01-31 MED ORDER — SODIUM CHLORIDE 0.9 % IV BOLUS
500.0000 mL | Freq: Once | INTRAVENOUS | Status: AC
  Administered 2019-01-31: 09:00:00 500 mL via INTRAVENOUS

## 2019-01-31 MED ORDER — HYDROCODONE-ACETAMINOPHEN 5-325 MG PO TABS: 1.0000 | ORAL_TABLET | ORAL | Status: DC | PRN

## 2019-01-31 MED ORDER — LIDOCAINE HCL (CARDIAC) PF 100 MG/5ML IV SOSY
PREFILLED_SYRINGE | INTRAVENOUS | Status: DC | PRN
  Administered 2019-01-31: 40 mg via INTRAVENOUS

## 2019-01-31 MED ORDER — CALCIUM GLUCONATE-NACL 1-0.675 GM/50ML-% IV SOLN
1.0000 g | Freq: Once | INTRAVENOUS | Status: AC
  Administered 2019-01-31: 1000 mg via INTRAVENOUS
  Filled 2019-01-31: qty 50

## 2019-01-31 MED ORDER — PHENYLEPHRINE 40 MCG/ML (10ML) SYRINGE FOR IV PUSH (FOR BLOOD PRESSURE SUPPORT)
PREFILLED_SYRINGE | INTRAVENOUS | Status: AC
  Filled 2019-01-31: qty 10

## 2019-01-31 MED ORDER — ACETAMINOPHEN 325 MG PO TABS: 650.0000 mg | ORAL_TABLET | Freq: Four times a day (QID) | ORAL | Status: DC | PRN

## 2019-01-31 MED ORDER — HYDROMORPHONE HCL 1 MG/ML IJ SOLN
0.5000 mg | INTRAMUSCULAR | Status: DC | PRN
  Administered 2019-01-31 – 2019-02-02 (×8): 1 mg via INTRAVENOUS
  Filled 2019-01-31 (×9): qty 1

## 2019-01-31 MED ORDER — DEXAMETHASONE SODIUM PHOSPHATE 10 MG/ML IJ SOLN
INTRAMUSCULAR | Status: DC | PRN
  Administered 2019-01-31: 10 mg via INTRAVENOUS

## 2019-01-31 MED ORDER — SIMVASTATIN 20 MG PO TABS: 40.0000 mg | ORAL_TABLET | Freq: Every day | ORAL | Status: DC

## 2019-01-31 MED ORDER — BISACODYL 10 MG RE SUPP
10.0000 mg | Freq: Every day | RECTAL | Status: DC | PRN
  Administered 2019-02-02: 10 mg via RECTAL
  Filled 2019-01-31: qty 1

## 2019-01-31 MED ORDER — SUGAMMADEX SODIUM 200 MG/2ML IV SOLN
INTRAVENOUS | Status: DC | PRN
  Administered 2019-01-31: 200 mg via INTRAVENOUS

## 2019-01-31 MED ORDER — VASOPRESSIN 20 UNIT/ML IV SOLN
INTRAVENOUS | Status: DC | PRN
  Administered 2019-01-31: 2 [IU] via INTRAVENOUS
  Administered 2019-01-31 (×3): 1 [IU] via INTRAVENOUS
  Administered 2019-01-31 (×2): 2 [IU] via INTRAVENOUS
  Administered 2019-01-31 (×2): 1 [IU] via INTRAVENOUS
  Administered 2019-01-31: 2 [IU] via INTRAVENOUS

## 2019-01-31 MED ORDER — FINASTERIDE 5 MG PO TABS
5.0000 mg | ORAL_TABLET | Freq: Every day | ORAL | Status: DC
  Administered 2019-01-31 – 2019-02-13 (×10): 5 mg via ORAL
  Filled 2019-01-31 (×13): qty 1

## 2019-01-31 MED ORDER — SENNOSIDES-DOCUSATE SODIUM 8.6-50 MG PO TABS: 1.0000 | ORAL_TABLET | Freq: Every evening | ORAL | Status: DC | PRN

## 2019-01-31 MED ORDER — INSULIN ASPART 100 UNIT/ML IV SOLN
5.0000 [IU] | Freq: Once | INTRAVENOUS | Status: AC
  Administered 2019-01-31: 5 [IU] via INTRAVENOUS

## 2019-01-31 MED ORDER — ONDANSETRON HCL 4 MG/2ML IJ SOLN: 4.0000 mg | INTRAMUSCULAR | Status: DC | PRN

## 2019-01-31 MED ORDER — DEXTROSE 50 % IV SOLN
1.0000 | Freq: Once | INTRAVENOUS | Status: AC
  Administered 2019-01-31: 50 mL via INTRAVENOUS
  Filled 2019-01-31: qty 50

## 2019-01-31 MED ORDER — DEXTROSE-NACL 5-0.45 % IV SOLN
INTRAVENOUS | Status: DC
  Administered 2019-01-31: 03:00:00 via INTRAVENOUS

## 2019-01-31 MED ORDER — MORPHINE SULFATE (PF) 4 MG/ML IV SOLN: 4.0000 mg | INTRAVENOUS | Status: DC | PRN

## 2019-01-31 MED ORDER — DEXTROSE 50 % IV SOLN
1.0000 | Freq: Once | INTRAVENOUS | Status: AC
  Administered 2019-01-31: 22:00:00 50 mL via INTRAVENOUS
  Filled 2019-01-31: qty 50

## 2019-01-31 MED ORDER — SODIUM CHLORIDE 0.9 % IV SOLN
2.0000 g | INTRAVENOUS | Status: DC
  Administered 2019-02-01 – 2019-02-03 (×3): 2 g via INTRAVENOUS
  Filled 2019-01-31: qty 2
  Filled 2019-01-31 (×4): qty 20

## 2019-01-31 MED ORDER — DEXTROSE 50 % IV SOLN
INTRAVENOUS | Status: AC
  Filled 2019-01-31: qty 50

## 2019-01-31 MED ORDER — PROMETHAZINE HCL 25 MG/ML IJ SOLN: 6.2500 mg | INTRAMUSCULAR | Status: DC | PRN

## 2019-01-31 MED ORDER — SODIUM ZIRCONIUM CYCLOSILICATE 10 G PO PACK
10.0000 g | PACK | Freq: Four times a day (QID) | ORAL | Status: DC
  Administered 2019-01-31: 10 g via ORAL
  Filled 2019-01-31 (×3): qty 1

## 2019-01-31 MED ORDER — SODIUM BICARBONATE 8.4 % IV SOLN
INTRAVENOUS | Status: DC
  Administered 2019-01-31: 09:00:00 via INTRAVENOUS
  Filled 2019-01-31 (×4): qty 50

## 2019-01-31 MED ORDER — SODIUM CHLORIDE 0.9 % IV SOLN: INTRAVENOUS | Status: DC

## 2019-01-31 MED ORDER — SUCCINYLCHOLINE CHLORIDE 200 MG/10ML IV SOSY
PREFILLED_SYRINGE | INTRAVENOUS | Status: AC
  Filled 2019-01-31: qty 10

## 2019-01-31 MED ORDER — OXYCODONE HCL 5 MG PO TABS: 5.0000 mg | ORAL_TABLET | ORAL | Status: DC | PRN

## 2019-01-31 MED ORDER — 0.9 % SODIUM CHLORIDE (POUR BTL) OPTIME
TOPICAL | Status: DC | PRN
  Administered 2019-01-31 (×3): 1000 mL

## 2019-01-31 MED ORDER — EPHEDRINE 5 MG/ML INJ
INTRAVENOUS | Status: AC
  Filled 2019-01-31: qty 10

## 2019-01-31 MED ORDER — LIDOCAINE 2% (20 MG/ML) 5 ML SYRINGE
INTRAMUSCULAR | Status: AC
  Filled 2019-01-31: qty 5

## 2019-01-31 MED ORDER — ACETAMINOPHEN 10 MG/ML IV SOLN
1000.0000 mg | Freq: Four times a day (QID) | INTRAVENOUS | Status: DC
  Administered 2019-01-31: 1000 mg via INTRAVENOUS
  Filled 2019-01-31 (×2): qty 100

## 2019-01-31 MED ORDER — DIPHENHYDRAMINE HCL 12.5 MG/5ML PO ELIX: 12.5000 mg | ORAL_SOLUTION | Freq: Four times a day (QID) | ORAL | Status: DC | PRN

## 2019-01-31 MED ORDER — SUGAMMADEX SODIUM 500 MG/5ML IV SOLN
INTRAVENOUS | Status: AC
  Filled 2019-01-31: qty 5

## 2019-01-31 MED ORDER — ONDANSETRON HCL 4 MG/2ML IJ SOLN
INTRAMUSCULAR | Status: DC | PRN
  Administered 2019-01-31: 4 mg via INTRAVENOUS

## 2019-01-31 MED ORDER — SUCCINYLCHOLINE CHLORIDE 20 MG/ML IJ SOLN
INTRAMUSCULAR | Status: DC | PRN
  Administered 2019-01-31: 140 mg via INTRAVENOUS

## 2019-01-31 MED ORDER — DEXTROSE-NACL 5-0.9 % IV SOLN
INTRAVENOUS | Status: DC
  Administered 2019-01-31 – 2019-02-05 (×8): via INTRAVENOUS

## 2019-01-31 MED ORDER — LACTATED RINGERS IV SOLN
INTRAVENOUS | Status: DC | PRN
  Administered 2019-01-31 (×2): via INTRAVENOUS

## 2019-01-31 MED ORDER — ROCURONIUM BROMIDE 50 MG/5ML IV SOSY
PREFILLED_SYRINGE | INTRAVENOUS | Status: AC
  Filled 2019-01-31: qty 5

## 2019-01-31 MED ORDER — OXYBUTYNIN CHLORIDE 5 MG PO TABS
5.0000 mg | ORAL_TABLET | Freq: Three times a day (TID) | ORAL | Status: DC | PRN
  Filled 2019-01-31 (×2): qty 1

## 2019-01-31 MED ORDER — FENTANYL CITRATE (PF) 250 MCG/5ML IJ SOLN
INTRAMUSCULAR | Status: DC | PRN
  Administered 2019-01-31: 100 ug via INTRAVENOUS

## 2019-01-31 MED ORDER — ACETAMINOPHEN 650 MG RE SUPP: 650.0000 mg | Freq: Four times a day (QID) | RECTAL | Status: DC | PRN

## 2019-01-31 MED ORDER — PROPOFOL 10 MG/ML IV BOLUS
INTRAVENOUS | Status: DC | PRN
  Administered 2019-01-31: 200 mg via INTRAVENOUS

## 2019-01-31 MED ORDER — DEXTROSE 50 % IV SOLN
50.0000 mL | Freq: Once | INTRAVENOUS | Status: AC
  Administered 2019-01-31: 50 mL via INTRAVENOUS

## 2019-01-31 MED ORDER — FLEET ENEMA 7-19 GM/118ML RE ENEM: 1.0000 | ENEMA | Freq: Once | RECTAL | Status: DC | PRN

## 2019-01-31 MED ORDER — ZOLPIDEM TARTRATE 5 MG PO TABS: 5.0000 mg | ORAL_TABLET | Freq: Every evening | ORAL | Status: DC | PRN

## 2019-01-31 MED ORDER — ONDANSETRON HCL 4 MG PO TABS: 4.0000 mg | ORAL_TABLET | Freq: Four times a day (QID) | ORAL | Status: DC | PRN

## 2019-01-31 MED ORDER — HYDROMORPHONE HCL 1 MG/ML IJ SOLN: 0.2500 mg | INTRAMUSCULAR | Status: DC | PRN

## 2019-01-31 MED ORDER — INSULIN ASPART 100 UNIT/ML IV SOLN
10.0000 [IU] | Freq: Once | INTRAVENOUS | Status: AC
  Administered 2019-01-31: 22:00:00 10 [IU] via INTRAVENOUS

## 2019-01-31 MED ORDER — MEPERIDINE HCL 50 MG/ML IJ SOLN: 6.2500 mg | INTRAMUSCULAR | Status: DC | PRN

## 2019-01-31 MED ORDER — DIPHENHYDRAMINE HCL 50 MG/ML IJ SOLN: 12.5000 mg | Freq: Four times a day (QID) | INTRAMUSCULAR | Status: DC | PRN

## 2019-01-31 MED ORDER — ONDANSETRON HCL 4 MG/2ML IJ SOLN
4.0000 mg | Freq: Four times a day (QID) | INTRAMUSCULAR | Status: DC | PRN
  Administered 2019-01-31 – 2019-02-01 (×3): 4 mg via INTRAVENOUS
  Filled 2019-01-31 (×4): qty 2

## 2019-01-31 MED ORDER — SODIUM BICARBONATE 8.4 % IV SOLN
50.0000 meq | Freq: Once | INTRAVENOUS | Status: AC
  Administered 2019-01-31: 50 meq via INTRAVENOUS
  Filled 2019-01-31: qty 50

## 2019-01-31 MED ORDER — VASOPRESSIN 20 UNIT/ML IV SOLN
INTRAVENOUS | Status: AC
  Filled 2019-01-31: qty 1

## 2019-01-31 MED ORDER — PHENYLEPHRINE HCL (PRESSORS) 10 MG/ML IV SOLN
INTRAVENOUS | Status: DC | PRN
  Administered 2019-01-31: 120 ug via INTRAVENOUS
  Administered 2019-01-31: 80 ug via INTRAVENOUS
  Administered 2019-01-31: 120 ug via INTRAVENOUS
  Administered 2019-01-31: 80 ug via INTRAVENOUS
  Administered 2019-01-31 (×2): 200 ug via INTRAVENOUS

## 2019-01-31 MED ORDER — ACETAMINOPHEN 10 MG/ML IV SOLN
1000.0000 mg | Freq: Four times a day (QID) | INTRAVENOUS | Status: AC
  Administered 2019-01-31 (×3): 1000 mg via INTRAVENOUS
  Filled 2019-01-31 (×2): qty 100

## 2019-01-31 MED ORDER — SODIUM POLYSTYRENE SULFONATE 15 GM/60ML PO SUSP
30.0000 g | Freq: Once | ORAL | Status: DC
  Filled 2019-01-31: qty 120

## 2019-01-31 MED ORDER — SODIUM CHLORIDE 0.9 % IV SOLN
2.0000 g | Freq: Once | INTRAVENOUS | Status: AC
  Administered 2019-01-31: 2 g via INTRAVENOUS
  Filled 2019-01-31: qty 20

## 2019-01-31 NOTE — Op Note (Signed)
Procedure: 1.  Exploratory laparotomy with repair of bladder rupture.  Preop diagnosis: Clot retention with intraperitoneal bladder rupture and AKI.  Postop diagnosis: Same.  Surgeon: Dr. Bjorn Pippin.  Assistant: Dr. Marin Olp.  Anesthesia: General.  Specimen: None.  Drains: 22 French Foley catheter and 19 [place] Blake drain.  EBL: Minimal, but approximately 200 mL of clot and several 100 mL of bloody urine were evacuated from the abdomen.  Complications: None.  Indications: The patient is a 58 year old male who [facility].  A CT scan demonstrated a distended bladder with a large amount of clot.  He was discharged home with instructions for urology follow-up, but over the weekend he fell and developed abdominal pain.  He was seen in the [facility] emergency room where a subsequent CT scan demonstrated a decompressed bladder with a large amount of intra-abdominal fluid and some fresh clot at the base of the bladder.  He was transferred to [facility] where a CT cystogram confirmed a large intraperitoneal bladder rupture.  He has AK I with a potassium of 6.1 and a creatinine of approximately 7 from intra-abdominal urine absorption.  Procedure: He was given 2 g of Rocephin.  He was taken the operating room where he was placed on the table in a supine position.  A general anesthetic was induced.  He was fitted with PAS hose.  His abdomen and genitalia was clipped and he was prepped with ChloraPrep with additional Betadine on the genitalia.  He was draped in usual sterile fashion.  A lower midline incision was made from the pubis to just below the umbilicus with a knife.  The subcutaneous tissue and anterior rectus fascia were incised with the Bovie.  The rectus muscles were parted in the midline and the peritoneal cavity was opened.  A large amount of bloody urine with clots effluxed from the peritoneal cavity.  The peritoneum  over the dome of the bladder was incised and the bladder rupture was easily identified.  The bladder lumen was thoroughly inspected and no active bleeding was noted.  No obvious bladder tumors were identified.  A 22 French Foley catheter was inserted into the bladder and the balloon was filled with 10 mL of sterile fluid.  The bladder rupture was then closed in 2 layers with a 2-0 Vicryl musculomucosal layer followed by a running 0 Vicryl seromuscular layer.  The bladder was then filled and the closure was checked for leakage.  It was found to be watertight.  The abdomen was then further explored to remove as much of the bloody urine and clot as possible and then irrigated copiously with normal saline.  Once the irrigant returned sufficiently clear a drain was placed through the left abdominal wall and was positioned superior to the bladder and the peritoneal cavity.  The drain was secured with a 3-0 nylon suture.  The anterior rectus fascia was closed using a running #1 PDS and the skin was closed with clips.  A honeycomb dressing was applied.  The drain was placed to bulb suction.  The Foley catheter was irrigated a final time with clear return and was then placed to straight drainage.  His anesthetic was reversed and he was moved to recovery room in stable condition.  The sponge instrument and needle counts were all correct.  There were no complications.

## 2019-01-31 NOTE — Anesthesia Procedure Notes (Signed)
Procedure Name: Intubation Date/Time: 01/31/2019 12:19 AM Performed by: Adonis Housekeeper, CRNA Pre-anesthesia Checklist: Patient identified, Suction available, Emergency Drugs available and Patient being monitored Patient Re-evaluated:Patient Re-evaluated prior to induction Oxygen Delivery Method: Circle system utilized Preoxygenation: Pre-oxygenation with 100% oxygen Induction Type: IV induction and Rapid sequence Laryngoscope Size: Glidescope and 4 Grade View: Grade I Tube type: Oral Tube size: 7.5 mm Number of attempts: 1 Airway Equipment and Method: Video-laryngoscopy and Rigid stylet Placement Confirmation: ETT inserted through vocal cords under direct vision,  positive ETCO2 and breath sounds checked- equal and bilateral Secured at: 22 cm Tube secured with: Tape Dental Injury: Teeth and Oropharynx as per pre-operative assessment

## 2019-01-31 NOTE — Consult Note (Signed)
Medical Consultation   Caleb ChurchJoseph T Wittmann  WUJ:[id number]  DOB: 02-24-1961  DOA: 01/30/2019  PCP: Richardean Chimeraaniel, Terry G, MD   Requesting physician:  Dr. Annabell HowellsWrenn (urology)   Reason for consultation:  Medical management    History of Present Illness: Caleb Potter is an 58 y.o. male with history of hypertension, coronary artery disease, and nephrolithiasis, [facility] with 4 to 5 days of worsening abdominal pain.  [name] had been experiencing some gross hematuria with clots and was evaluated in the outpatient setting with CT abdomen and pelvis on 01/26/2019.  CT was concerning for bladder rupture per patient report, he was started on Bactrim, and was to follow-up with urology in the clinic on 01/31/2019.  With worsening pain across his abdomen and lightheadedness with near syncope, patient presented to the ED for more immediate evaluation.  In the emergency department, he was found to be afebrile with normal heart rate and a systolic blood pressure of 64.  EKG features a sinus rhythm, chest x-Caleb is negative for acute cardiopulmonary disease, and CT abdomen and pelvis without contrast was concerning for possible intraperitoneal hemorrhage, and also notable for a significant decrease in hemorrhage that had been seen in the urinary bladder on the prior CT.  Trauma surgery was consulted by the ED physician and [facility].  Based on the imaging findings and clinical scenario, there was concern for bladder rupture and CT pyelogram was performed, revealing a long segment of bladder discontinuity along the dome consistent with a rupture, as well as passage of contrast material into the peritoneum.  Urology was contacted, admitted the patient, and took him to the operating room for exploratory laparotomy with repair of bladder rupture and drainage of intraperitoneal urine.  There were no immediate complications,  patient went to the recovery room in stable condition, and hospitalist service was asked to consult for medical management.   Review of Systems:  ROS As per HPI otherwise 10 point review of systems negative.   Past Medical History: Past Medical History:  Diagnosis Date  . Coronary artery disease   . History of hiatal hernia   . Hypertension   . Myocardial infarction (HCC) 2000   light MI    Past Surgical History: Past Surgical History:  Procedure Laterality Date  . CARPAL TUNNEL RELEASE  2010   right wrist  . CORONARY ANGIOPLASTY  2000  . CYST REMOVAL NECK  2008  . CYSTOSCOPY WITH RETROGRADE PYELOGRAM, URETEROSCOPY AND STENT PLACEMENT Bilateral 11/07/2014   Procedure: CYSTOSCOPY WITH RETROGRADE PYELOGRAM, URETEROSCOPY AND STENT PLACEMENT;  Surgeon: Sebastian Acheheodore Manny, MD;  Location: [facility];  Service: Urology;  Laterality: Bilateral;  . HERNIA REPAIR  1994   left inguinal  . HIATAL HERNIA REPAIR  2007  . HOLMIUM LASER APPLICATION Bilateral 11/07/2014   Procedure: HOLMIUM LASER APPLICATION;  Surgeon: Sebastian Acheheodore Manny, MD;  Location: [facility];  Service: Urology;  Laterality: Bilateral;  . right knee reconstruction  2010   right     Allergies:   Allergies  Allergen Reactions  . Citalopram     Grind teeth. "high power cocaine"      Social History:  reports that he has been smoking cigarettes. He has a 40.00 pack-year smoking history. He has quit using smokeless tobacco. He reports current alcohol use. No history on file for drug.   Family History:  History reviewed. No pertinent family history.   Physical Exam: Vitals:   01/30/19 2345 01/31/19 0139 01/31/19 0145 01/31/19 0200  BP: 121/71 122/70 134/89 124/71  Pulse: 93 91 (!) 103 (!) 104  Resp: (!) 24 18 (!) 28 (!) 34  Temp:  (!) 97.2 F (36.2 C)    TempSrc:      SpO2: 91% 100% 97% 97%  Weight:      Height:        Constitutional: Alert and awake, oriented x3, not in any acute distress. Eyes: PERLA, EOMI, irises  appear normal, anicteric sclera,  ENMT: external ears and nose appear normal, lips appears normal, oropharynx mucosa, tongue, posterior pharynx appear normal  Neck: neck appears normal, no masses, normal ROM, no thyromegaly, no JVD  CVS: S1-S2 clear, no murmur rubs or gallops, no LE edema   Respiratory:  clear to auscultation bilaterally, no wheezing, rales or rhonchi. Respiratory effort normal. No accessory muscle use.  Abdomen: distended but soft. Bowel sounds active.   Musculoskeletal: : no cyanosis, clubbing or edema noted bilaterally  Neuro: Cranial nerves II-XII intact, sensation intact, moving all extremities Psych: judgement and insight appear normal, stable mood and affect, mental status   Data reviewed:  I have personally reviewed following labs and imaging studies Labs:  CBC: Recent Labs  Lab 01/30/19 1920 01/30/19 2241  WBC 6.7 11.3*  NEUTROABS 4.2  --   HGB 11.7* 11.6*  HCT 35.9* 35.9*  MCV 102.6* 100.3*  PLT 176 142*    Basic Metabolic Panel: Recent Labs  Lab 01/30/19 1920  NA 134*  K 6.1*  CL 101  CO2 21*  GLUCOSE 120*  BUN 64*  CREATININE 7.76*  CALCIUM 9.4   GFR Estimated Creatinine Clearance: 11.2 mL/min (A) (by C-G formula based on SCr of 7.76 mg/dL (H)). Liver Function Tests: Recent Labs  Lab 01/30/19 1920  AST 36  ALT 30  ALKPHOS 65  BILITOT 1.0  PROT 6.5  ALBUMIN 4.2   No results for input(s): LIPASE, AMYLASE in the last 168 hours. No results for input(s): AMMONIA in the last 168 hours. Coagulation profile Recent Labs  Lab 01/30/19 1920  INR 1.0    Cardiac Enzymes: Recent Labs  Lab 01/30/19 1920  TROPONINI <0.03   BNP: Invalid input(s): POCBNP CBG: No results for input(s): GLUCAP in the last 168 hours. D-Dimer No results for input(s): DDIMER in the last 72 hours. Hgb A1c No results for input(s): HGBA1C in the last 72 hours. Lipid Profile No results for input(s): CHOL, HDL, LDLCALC, TRIG, CHOLHDL, LDLDIRECT in the last  72 hours. Thyroid function studies No results for input(s): TSH, T4TOTAL, T3FREE, THYROIDAB in the last 72 hours.  Invalid input(s): FREET3 Anemia work up No results for input(s): VITAMINB12, FOLATE, FERRITIN, TIBC, IRON, RETICCTPCT in the last 72 hours. Urinalysis No results found for: COLORURINE, APPEARANCEUR, LABSPEC, PHURINE, GLUCOSEU, HGBUR, BILIRUBINUR, KETONESUR, PROTEINUR, UROBILINOGEN, NITRITE, LEUKOCYTESUR   Microbiology No results found for this or any previous visit (from the past 240 hour(s)).     Inpatient Medications:   Scheduled Meds: Continuous Infusions: . acetaminophen    . dextrose 5 % and 0.45% NaCl       Radiological Exams on Admission: Ct Abdomen Pelvis Wo Contrast  Result Date: 01/30/2019 CLINICAL DATA:  Gross hematuria.  Bilateral flank pain after fall. EXAM: CT ABDOMEN AND PELVIS WITHOUT CONTRAST TECHNIQUE: Multidetector CT imaging of the abdomen and pelvis was performed following the standard protocol without IV contrast. COMPARISON:  CT scan  of January 26, 2019. FINDINGS: Lower chest: No acute abnormality. Hepatobiliary: No gallstones or biliary dilatation is noted. Mild amount of fluid is noted around the liver with dense material seen posteriorly consistent with hematoma. Pancreas: Unremarkable. No pancreatic ductal dilatation or surrounding inflammatory changes. Spleen: Fluid is noted around the spleen which was not present on prior exam and is concerning for hemorrhage. Adrenals/Urinary Tract: Adrenal glands appear normal. Nonobstructive right renal calculus is noted. No hydronephrosis or renal obstruction is noted. The amount of hemorrhage seen in urinary bladder on prior exam is significantly decreased, although residual hemorrhage does remain in the dependent portion of the bladder. Stomach/Bowel: The stomach appears normal. There is no evidence of bowel obstruction or inflammation. The appendix is not clearly visualized. Vascular/Lymphatic: Aortic  atherosclerosis. No enlarged abdominal or pelvic lymph nodes. Reproductive: Stable mild prostatic enlargement is noted. Other: There does appear to be a moderate amount of hemorrhage present in the dependent portion of the pelvis as well as in the left pericolic gutter. Musculoskeletal: No acute or significant osseous findings. IMPRESSION: There is interval development of a moderate amount of intraperitoneal hemorrhage seen in the pelvis, both pericolic gutters, and around the liver and spleen. Critical Value/emergent results were called by telephone at the time of interpretation on 01/30/2019 at 8:29 pm to Dr. Terance Hart , who verbally acknowledged these results. The large amount of hemorrhage seen within the urinary bladder on prior exam has significantly improved, with only a mild amount of residual hemorrhage or thrombus seen posteriorly in the urinary bladder on current exam. Nonobstructive right renal calculus. No hydronephrosis or renal obstruction is noted. Stable mild prostatic enlargement. Electronically Signed   By: Lupita Raider, M.D.   On: 01/30/2019 20:30   Ct Pelvis Wo Contrast  Result Date: 01/30/2019 CLINICAL DATA:  Considerable free fluid within the abdomen on recent CT, request is been made for CT cystography. EXAM: CT PELVIS WITHOUT CONTRAST TECHNIQUE: Multidetector CT imaging of the pelvis was performed following the standard protocol without intravenous contrast. COMPARISON:  CT from earlier in the same day. FINDINGS: Urinary Tract: Contrast material is been instilled through the Foley catheter. The Foley catheter balloon is well visualized. The bladder distends well and demonstrates diffuse intraluminal filling defect consistent with residual thrombus. Additionally there is extravasation of contrast material from the dome of the bladder in a linear fashion which extends for approximately 2.9 cm consistent with bladder rupture. Contrast enhancement of the intraperitoneal fluid seen on the  recent CT examination consistent with the extravasation. Bowel:  Bowel is again within normal limits. Vascular/Lymphatic: Atherosclerotic calcifications are seen. No lymphadenopathy is noted. Reproductive: Prostate is mildly prominent but stable from the recent exam. Other: Free fluid is again noted within the abdomen and pelvis related to the bladder perforation. Musculoskeletal: Stable in appearance when compare with the prior CT. IMPRESSION: CT cystography reveals a long segment of bladder discontinuity along the dome consistent with rupture. Passage of contrast material into the peritoneal cavity is seen. Retained thrombus within the bladder is noted. Electronically Signed   By: Alcide Clever M.D.   On: 01/30/2019 23:20   Dg Chest Portable 1 View  Result Date: 01/30/2019 CLINICAL DATA:  Chest pain EXAM: PORTABLE CHEST 1 VIEW COMPARISON:  11/03/2014 FINDINGS: Cardiac shadows within normal limits. Tortuosity of the thoracic aorta is identified somewhat accentuated by the portable technique and patient rotation. The lungs are clear. No acute bony abnormality is noted. IMPRESSION: No acute abnormality noted. Electronically Signed  By: Alcide Clever M.D.   On: 01/30/2019 19:55    Impression/Recommendations  1. Intraperitoneal bladder rupture  - Status-post exploratory laparotomy and repair early am of 01/31/19  - Seen on floor after surgery with mild tenderness at surgical site, but reports feeling much better than prior to surgery  - Ongoing mgmt per primary   2. AKI; hyperkalemia  - SCr is 7.76 on admission, previously normal; potassium is 6.1 without EKG changes  - Likely prerenal azotemia in setting of hypotension, as well pseudo-azotemia in setting of urine absorption from peritoneum  - He was fluid-resuscitated in and given IV calcium and bicarbonate for the hyperkalemia  - Continue IVF hydration, repeat chem panel now, continue cardiac monitoring until potassium normalized    3. Hypertension   - SBP 60's initially in ED, normalized with fluid-resuscitation  - Hold ACE-i in light of AKI and hyperkalemia, resume beta-blocker in am if BP remains stable    4. CAD  - Reports no angina since LAD was stented remotely  - Continue statin, resume beta-blocker in am if BP remains stable overnight, hold ACE-i until renal function improved   5. Presyncope  - No associated chest pain or palpitations  - No arryhthmia, pauses, or blocks on EKG  - Likely secondary to hypotension that resolved with fluid-resuscitation in ED    Thank you for this consultation.  Our [facility] hospitalist team will follow the patient with you.   Time Spent: 84 minutes.   Lavone Neri  M.D. [facility] 01/31/2019, 2:14 AM

## 2019-01-31 NOTE — Transfer of Care (Signed)
Immediate Anesthesia Transfer of Care Note  Patient: Caleb Potter  Procedure(s) Performed: EXPLORATORY LAPAROTOMY (N/A ) Bladder Repair  Patient Location: PACU  Anesthesia Type:General  Level of Consciousness: awake, alert , oriented and patient cooperative  Airway & Oxygen Therapy: Patient Spontanous Breathing and Patient connected to nasal cannula oxygen  Post-op Assessment: Report given to RN and Post -op Vital signs reviewed and stable  Post vital signs: Reviewed and stable  Last Vitals:  Vitals Value Taken Time  BP 122/70 01/31/2019  1:42 AM  Temp    Pulse 102 01/31/2019  1:43 AM  Resp 32 01/31/2019  1:43 AM  SpO2 98 % 01/31/2019  1:43 AM  Vitals shown include unvalidated device data.  Last Pain:  Vitals:   01/30/19 2216  TempSrc:   PainSc: 10-Worst pain ever         Complications: No apparent anesthesia complications

## 2019-01-31 NOTE — Progress Notes (Signed)
1 Day Post-Op  Subjective: He is s/p repair of bladder rupture associated with clot retention and a fall.  He is doing well with minimal pain and no nausea.   He is tolerating ice chips.  His urine is clear with good output, but his K was up to 6.6 despite a slight decline in his Cr on postop labs.  He has been treated for the hyperkalemia.  Hgb is 12.5 which is up a point from preop.  ROS:  Review of Systems  Constitutional: Negative for chills and fever.  Respiratory: Negative for shortness of breath.   Cardiovascular: Negative for chest pain.  Gastrointestinal: Negative for nausea.  All other systems reviewed and are negative.   Anti-infectives: Anti-infectives (From admission, onward)   Start     Dose/Rate Route Frequency Ordered Stop   02/01/19 0100  cefTRIAXone (ROCEPHIN) 2 g in sodium chloride 0.9 % 100 mL IVPB     2 g 200 mL/hr over 30 Minutes Intravenous Every 24 hours 01/31/19 0223     01/31/19 0030  cefTRIAXone (ROCEPHIN) 2 g in sodium chloride 0.9 % 100 mL IVPB     2 g 200 mL/hr over 30 Minutes Intravenous  Once 01/31/19 0015 01/31/19 0240      Current Facility-Administered Medications  Medication Dose Route Frequency Provider Last Rate Last Dose  . 0.9 %  sodium chloride infusion   Intravenous Continuous Opyd, Lavone Neri, MD   Stopped at 01/31/19 0315  . acetaminophen (OFIRMEV) IV 1,000 mg  1,000 mg Intravenous Q6H Bjorn Pippin, MD      . acetaminophen (TYLENOL) tablet 650 mg  650 mg Oral Q6H PRN Opyd, Lavone Neri, MD       Or  . acetaminophen (TYLENOL) suppository 650 mg  650 mg Rectal Q6H PRN Opyd, Lavone Neri, MD      . bisacodyl (DULCOLAX) suppository 10 mg  10 mg Rectal Daily PRN Bjorn Pippin, MD      . Melene Muller ON 02/01/2019] cefTRIAXone (ROCEPHIN) 2 g in sodium chloride 0.9 % 100 mL IVPB  2 g Intravenous Q24H Bjorn Pippin, MD      . dextrose 5 %-0.45 % sodium chloride infusion   Intravenous Continuous Bjorn Pippin, MD 150 mL/hr at 01/31/19 1610    . diphenhydrAMINE  (BENADRYL) injection 12.5-25 mg  12.5-25 mg Intravenous Q6H PRN Bjorn Pippin, MD       Or  . diphenhydrAMINE (BENADRYL) 12.5 MG/5ML elixir 12.5-25 mg  12.5-25 mg Oral Q6H PRN Bjorn Pippin, MD      . HYDROcodone-acetaminophen (NORCO/VICODIN) 5-325 MG per tablet 1-2 tablet  1-2 tablet Oral Q4H PRN Opyd, Lavone Neri, MD      . HYDROmorphone (DILAUDID) injection 0.5-1 mg  0.5-1 mg Intravenous Q2H PRN Bjorn Pippin, MD   1 mg at 01/31/19 0257  . morphine 4 MG/ML injection 4 mg  4 mg Intravenous Q4H PRN Opyd, Lavone Neri, MD      . ondansetron (ZOFRAN) tablet 4 mg  4 mg Oral Q6H PRN Opyd, Lavone Neri, MD       Or  . ondansetron (ZOFRAN) injection 4 mg  4 mg Intravenous Q6H PRN Opyd, Lavone Neri, MD   4 mg at 01/31/19 0257  . oxybutynin (DITROPAN) tablet 5 mg  5 mg Oral Q8H PRN Bjorn Pippin, MD      . oxyCODONE (Oxy IR/ROXICODONE) immediate release tablet 5 mg  5 mg Oral Q4H PRN Bjorn Pippin, MD      . senna-docusate (Senokot-S) tablet 1 tablet  1 tablet Oral QHS PRN Bjorn Pippin, MD      . simvastatin (ZOCOR) tablet 40 mg  40 mg Oral q1800 Opyd, Lavone Neri, MD      . sodium phosphate (FLEET) 7-19 GM/118ML enema 1 enema  1 enema Rectal Once PRN Bjorn Pippin, MD      . zolpidem (AMBIEN) tablet 5 mg  5 mg Oral QHS PRN,MR X 1 Bjorn Pippin, MD         Objective: Vital signs in last 24 hours: Temp:  [97.2 F (36.2 C)-98.1 F (36.7 C)] 97.7 F (36.5 C) (04/13 0400) Pulse Rate:  [70-104] 98 (04/13 0700) Resp:  [7-35] 9 (04/13 0700) BP: (64-134)/(50-89) 100/65 (04/13 0700) SpO2:  [90 %-100 %] 97 % (04/13 0700) Weight:  [97.5 kg-100.2 kg] 100.2 kg (04/13 0300)  Intake/Output from previous day: 04/12 0701 - 04/13 0700 In: 1800 [I.V.:1600; IV Piggyback:200] Out: 2460 [Urine:2355; Drains:30; Blood:75] Intake/Output this shift: No intake/output data recorded.   Physical Exam Vitals signs reviewed.  Constitutional:      Appearance: Normal appearance. He is obese.  HENT:     Head: Normocephalic and atraumatic.   Neck:     Musculoskeletal: Normal range of motion and neck supple.  Cardiovascular:     Rate and Rhythm: Regular rhythm. Tachycardia present.  Pulmonary:     Effort: Pulmonary effort is normal. No respiratory distress.     Breath sounds: Normal breath sounds.  Abdominal:     General: There is distension.     Tenderness: There is no abdominal tenderness.     Comments: Obese with some bowel sounds.  Wound intact without erythema.   Genitourinary:    Comments: Foley draining clear urine.  Musculoskeletal: Normal range of motion.        General: No swelling or tenderness.  Skin:    General: Skin is warm and dry.  Neurological:     General: No focal deficit present.     Mental Status: He is alert and oriented to person, place, and time.  Psychiatric:        Mood and Affect: Mood normal.        Behavior: Behavior normal.     Lab Results:  Recent Labs    01/30/19 1920 01/30/19 2241 01/31/19 0302  WBC 6.7 11.3*  --   HGB 11.7* 11.6* 12.5*  HCT 35.9* 35.9* 38.5*  PLT 176 142*  --    BMET Recent Labs    01/30/19 1920 01/31/19 0302  NA 134* 135  K 6.1* 6.6*  CL 101 105  CO2 21* 19*  GLUCOSE 120* 147*  BUN 64* 59*  CREATININE 7.76* 6.41*  CALCIUM 9.4 8.0*   PT/INR Recent Labs    01/30/19 1920  LABPROT 13.4  INR 1.0   ABG No results for input(s): PHART, HCO3 in the last 72 hours.  Invalid input(s): PCO2, PO2  Studies/Results: Ct Abdomen Pelvis Wo Contrast  Result Date: 01/30/2019 CLINICAL DATA:  Gross hematuria.  Bilateral flank pain after fall. EXAM: CT ABDOMEN AND PELVIS WITHOUT CONTRAST TECHNIQUE: Multidetector CT imaging of the abdomen and pelvis was performed following the standard protocol without IV contrast. COMPARISON:  CT scan of January 26, 2019. FINDINGS: Lower chest: No acute abnormality. Hepatobiliary: No gallstones or biliary dilatation is noted. Mild amount of fluid is noted around the liver with dense material seen posteriorly consistent with  hematoma. Pancreas: Unremarkable. No pancreatic ductal dilatation or surrounding inflammatory changes. Spleen: Fluid is noted around the spleen which was  not present on prior exam and is concerning for hemorrhage. Adrenals/Urinary Tract: Adrenal glands appear normal. Nonobstructive right renal calculus is noted. No hydronephrosis or renal obstruction is noted. The amount of hemorrhage seen in urinary bladder on prior exam is significantly decreased, although residual hemorrhage does remain in the dependent portion of the bladder. Stomach/Bowel: The stomach appears normal. There is no evidence of bowel obstruction or inflammation. The appendix is not clearly visualized. Vascular/Lymphatic: Aortic atherosclerosis. No enlarged abdominal or pelvic lymph nodes. Reproductive: Stable mild prostatic enlargement is noted. Other: There does appear to be a moderate amount of hemorrhage present in the dependent portion of the pelvis as well as in the left pericolic gutter. Musculoskeletal: No acute or significant osseous findings. IMPRESSION: There is interval development of a moderate amount of intraperitoneal hemorrhage seen in the pelvis, both pericolic gutters, and around the liver and spleen. Critical Value/emergent results were called by telephone at the time of interpretation on 01/30/2019 at 8:29 pm to Dr. Terance Hart , who verbally acknowledged these results. The large amount of hemorrhage seen within the urinary bladder on prior exam has significantly improved, with only a mild amount of residual hemorrhage or thrombus seen posteriorly in the urinary bladder on current exam. Nonobstructive right renal calculus. No hydronephrosis or renal obstruction is noted. Stable mild prostatic enlargement. Electronically Signed   By: Lupita Raider, M.D.   On: 01/30/2019 20:30   Ct Pelvis Wo Contrast  Result Date: 01/30/2019 CLINICAL DATA:  Considerable free fluid within the abdomen on recent CT, request is been made for CT  cystography. EXAM: CT PELVIS WITHOUT CONTRAST TECHNIQUE: Multidetector CT imaging of the pelvis was performed following the standard protocol without intravenous contrast. COMPARISON:  CT from earlier in the same day. FINDINGS: Urinary Tract: Contrast material is been instilled through the Foley catheter. The Foley catheter balloon is well visualized. The bladder distends well and demonstrates diffuse intraluminal filling defect consistent with residual thrombus. Additionally there is extravasation of contrast material from the dome of the bladder in a linear fashion which extends for approximately 2.9 cm consistent with bladder rupture. Contrast enhancement of the intraperitoneal fluid seen on the recent CT examination consistent with the extravasation. Bowel:  Bowel is again within normal limits. Vascular/Lymphatic: Atherosclerotic calcifications are seen. No lymphadenopathy is noted. Reproductive: Prostate is mildly prominent but stable from the recent exam. Other: Free fluid is again noted within the abdomen and pelvis related to the bladder perforation. Musculoskeletal: Stable in appearance when compare with the prior CT. IMPRESSION: CT cystography reveals a long segment of bladder discontinuity along the dome consistent with rupture. Passage of contrast material into the peritoneal cavity is seen. Retained thrombus within the bladder is noted. Electronically Signed   By: Alcide Clever M.D.   On: 01/30/2019 23:20   Dg Chest Portable 1 View  Result Date: 01/30/2019 CLINICAL DATA:  Chest pain EXAM: PORTABLE CHEST 1 VIEW COMPARISON:  11/03/2014 FINDINGS: Cardiac shadows within normal limits. Tortuosity of the thoracic aorta is identified somewhat accentuated by the portable technique and patient rotation. The lungs are clear. No acute bony abnormality is noted. IMPRESSION: No acute abnormality noted. Electronically Signed   By: Alcide Clever M.D.   On: 01/30/2019 19:55   Labs and hospitalist's note reviewed.    Assessment and Plan: Gross hematuria with bladder rupture from clot retention.   Urine is clear s/p repair.  I will continue ice chips only for 24 hrs.  Continue rocephin for 2 more days.  AKI with hypekalemia.  Cr was down with first postop lab but K was up.  He has been given insulin and D50 by medicine who will continue to manage the electrolytes and meds.  BPH with BOO.  He had his tamsulosin doubled recently.  I will add finasteride, but he may need an outlet procedure to prevent recurrent retention once he has recovered from his surgery.       LOS: 1 day    Bjorn PippinJohn Amram Maya 01/31/2019 [phone] ID: Caleb ChurchJoseph T Potter, male   DOB: 03-Aug-1961, 58 y.o.   MRN: [id number]

## 2019-01-31 NOTE — Evaluation (Signed)
Physical Therapy Evaluation Patient Details Name: Caleb Potter MRN: [id number] DOB: 07/17/1961 Today's Date: 01/31/2019   History of Present Illness  Patient is a 58 yo male who came in for fall with significant abdominal pain found to have free fluid in abdomen and is now s/p X-lap with repair of the bladder rupture and drainage of the intraperitoneal urine  Clinical Impression  Patient seen for mobility assessment s/p ex/ lap surgery. Mobilizing well. Educated patient on precautions, mobility expectations, and safety. Patient is mobilizing well with only supervision for line management. No further acute PT needs. Will sign off.     Follow Up Recommendations No PT follow up    Equipment Recommendations  None recommended by PT    Recommendations for Other Services       Precautions / Restrictions Precautions Precautions: Fall      Mobility  Bed Mobility               General bed mobility comments: received in chair  Transfers Overall transfer level: Needs assistance   Transfers: Sit to/from Stand Sit to Stand: Supervision         General transfer comment: supervision for safety and line management, no physical assist required  Ambulation/Gait Ambulation/Gait assistance: Supervision Gait Distance (Feet): 310 Feet Assistive device: None Gait Pattern/deviations: WFL(Within Functional Limits) Gait velocity: mildly decreased Gait velocity interpretation: 1.31 - 2.62 ft/sec, indicative of limited community ambulator General Gait Details: steady with ambulation and higher level balance tasks  Stairs Stairs: Yes Stairs assistance: Supervision Stair Management: One rail Right Number of Stairs: 4 General stair comments: supervision for safety and line management, no physical assist required  Wheelchair Mobility    Modified Rankin (Stroke Patients Only)       Balance Overall balance assessment: History of Falls                                Standardized Balance Assessment Standardized Balance Assessment : Dynamic Gait Index   Dynamic Gait Index Level Surface: Normal Change in Gait Speed: Mild Impairment Gait with Horizontal Head Turns: Mild Impairment Gait with Vertical Head Turns: Normal Gait and Pivot Turn: Normal Step Over Obstacle: Normal Step Around Obstacles: Mild Impairment Steps: Mild Impairment Total Score: 20       Pertinent Vitals/Pain Pain Assessment: Faces Faces Pain Scale: Hurts little more Pain Location: arthritic pain in joints (left knee) Pain Descriptors / Indicators: Sore Pain Intervention(s): Monitored during session    Home Living Family/patient expects to be discharged to:: Private residence Living Arrangements: Spouse/significant other;Children Available Help at Discharge: Family Type of Home: House Home Access: Stairs to enter   Secretary/administrator of Steps: 3 Home Layout: One level        Prior Function Level of Independence: Independent               Hand Dominance   Dominant Hand: Right    Extremity/Trunk Assessment   Upper Extremity Assessment Upper Extremity Assessment: Overall WFL for tasks assessed    Lower Extremity Assessment Lower Extremity Assessment: Overall WFL for tasks assessed(+ arthritic changes with pain in left knee)       Communication   Communication: No difficulties  Cognition Arousal/Alertness: Awake/alert Behavior During Therapy: WFL for tasks assessed/performed Overall Cognitive Status: Within Functional Limits for tasks assessed  General Comments      Exercises     Assessment/Plan    PT Assessment Patent does not need any further PT services  PT Problem List         PT Treatment Interventions      PT Goals (Current goals can be found in the Care Plan section)  Acute Rehab PT Goals PT Goal Formulation: All assessment and education complete, DC therapy     Frequency     Barriers to discharge        Co-evaluation               AM-PAC PT "6 Clicks" Mobility  Outcome Measure Help needed turning from your back to your side while in a flat bed without using bedrails?: None Help needed moving from lying on your back to sitting on the side of a flat bed without using bedrails?: None Help needed moving to and from a bed to a chair (including a wheelchair)?: None Help needed standing up from a chair using your arms (e.g., wheelchair or bedside chair)?: A Little Help needed to walk in hospital room?: A Little Help needed climbing 3-5 steps with a railing? : A Little 6 Click Score: 21    End of Session Equipment Utilized During Treatment: Gait belt Activity Tolerance: Patient tolerated treatment well Patient left: in chair;with call bell/phone within reach Nurse Communication: Mobility status PT Visit Diagnosis: History of falling (Z91.81)    Time: [id number] PT Time Calculation (min) (ACUTE ONLY): 18 min   Charges:   PT Evaluation $PT Eval Moderate Complexity: 1 Mod          Charlotte Crumbevon Nazar Kuan, PT DPT  Board Certified Neurologic Specialist Acute Rehabilitation Services Pager [phone] Office [phone]   Fabio AsaDevon J Erven Ramson 01/31/2019, 3:17 PM

## 2019-01-31 NOTE — Progress Notes (Addendum)
PROGRESS NOTE    Caleb Potter  RCB:[id number] DOB: 1961-05-15 DOA: 01/30/2019 PCP: [name], MD    Brief Narrative: 58 year old with past medical history significant for hypertension, coronary artery disease, nephrolithiasis who presented to [facility] with for 5 days of worsening abdominal pain.  Patient had experience gross hematuria with clots, he was evaluated in the outpatient setting with a CT abdomen and pelvis on 01/26/2019 showed blood clot.   he was a started on Bactrim.  He was recommended to follow-up with urology as an outpatient.  He present  complaining of worsening abdominal pain.  CT abdomen and pelvis showed possible intraperitoneal hemorrhage.  General surgery and urology was consulted.  Subsequently a CT pyelogram showed a long segment of bladder discontinuity along the dome consistent with a rupture, as well as passage material into the peritoneum.  Patient was taken to the OR for exploratory laparotomy with repair of bladder rupture and drainage of intraperitoneal urine.   Assessment & Plan:   Principal Problem:   Intraperitoneal rupture of bladder Active Problems:   Intraperitoneal hematoma   Hypertension   Coronary artery disease   AKI (acute kidney injury) (HCC)   Hyperkalemia   Anxiety   1-AKA: Hyperkalemia; Related to prerenal azotemia in the setting of hypotension, bladder rupture, ACE.  He received calcium gluconate, Amp Bicarb.  lokelma ordered one dose.  Monitor k every 6 hours.  K decreasing now at 5.1.  Continue with IV fluids.  If potasium increases today, will give extra dose of lokelma.   2-Intraperitoneal Bladder Rupture;  Underwent exploratory laparotomy and repair 4-13 Management per urology.   3-HTN; hold ace due to AKI.  Hold BP medication due to hypotension.   4-Near syncope; Hypotension; related to hypovolemia and intraperitoneal hematoma.  IV fluids.  Monitor Hb.   HLD;  Hold statin.    Estimated body mass  index is 35.65 kg/m as calculated from the following:   Height as of this encounter: 5\' 6"  (1.676 m).   Weight as of this encounter: 100.2 kg.   DVT prophylaxis: SCD Code Status: Full code Family Communication: care discussed with patient.  Disposition Plan: remain in the hospital.     Procedures:    Antimicrobials: ceftriaxone   Subjective: He report abdominal pain is controlled.  Denies dyspnea.    Objective: Vitals:   01/31/19 0530 01/31/19 0600 01/31/19 0630 01/31/19 0700  BP: (!) 93/52 (!) 95/57 101/66 100/65  Pulse: 97 93 99 98  Resp: (!) 23 15 (!) 25 (!) 9  Temp:      TempSrc:      SpO2: 94% 94% 96% 97%  Weight:      Height:        Intake/Output Summary (Last 24 hours) at 01/31/2019 0818 Last data filed at 01/31/2019 0700 Gross per 24 hour  Intake 1800 ml  Output 2460 ml  Net -660 ml   Filed Weights   01/30/19 1841 01/31/19 0300  Weight: 97.5 kg 100.2 kg    Examination:  General exam: Appears calm and comfortable  Respiratory system: Clear to auscultation. Respiratory effort normal. Cardiovascular system: S1 & S2 heard, RRR. No JVD, murmurs, rubs, gallops or clicks. No pedal edema. Gastrointestinal system: Abdomen is distended, No rigidity, mild tender, dressing in place.  Central nervous system: Alert and oriented. Extremities: Symmetric 5 x 5 power. Skin: No rashes, lesions or ulcers    Data Reviewed: I have personally reviewed following labs and imaging studies  CBC: Recent  Labs  Lab 01/30/19 1920 01/30/19 2241 01/31/19 0302  WBC 6.7 11.3*  --   NEUTROABS 4.2  --   --   HGB 11.7* 11.6* 12.5*  HCT 35.9* 35.9* 38.5*  MCV 102.6* 100.3*  --   PLT 176 142*  --    Basic Metabolic Panel: Recent Labs  Lab 01/30/19 1920 01/31/19 0302  NA 134* 135  K 6.1* 6.6*  CL 101 105  CO2 21* 19*  GLUCOSE 120* 147*  BUN 64* 59*  CREATININE 7.76* 6.41*  CALCIUM 9.4 8.0*   GFR: Estimated Creatinine Clearance: 13.8 mL/min (A) (by C-G formula  based on SCr of 6.41 mg/dL (H)). Liver Function Tests: Recent Labs  Lab 01/30/19 1920  AST 36  ALT 30  ALKPHOS 65  BILITOT 1.0  PROT 6.5  ALBUMIN 4.2   No results for input(s): LIPASE, AMYLASE in the last 168 hours. No results for input(s): AMMONIA in the last 168 hours. Coagulation Profile: Recent Labs  Lab 01/30/19 1920  INR 1.0   Cardiac Enzymes: Recent Labs  Lab 01/30/19 1920  TROPONINI <0.03   BNP (last 3 results) No results for input(s): PROBNP in the last 8760 hours. HbA1C: No results for input(s): HGBA1C in the last 72 hours. CBG: Recent Labs  Lab 01/31/19 0510  GLUCAP 131*   Lipid Profile: No results for input(s): CHOL, HDL, LDLCALC, TRIG, CHOLHDL, LDLDIRECT in the last 72 hours. Thyroid Function Tests: No results for input(s): TSH, T4TOTAL, FREET4, T3FREE, THYROIDAB in the last 72 hours. Anemia Panel: No results for input(s): VITAMINB12, FOLATE, FERRITIN, TIBC, IRON, RETICCTPCT in the last 72 hours. Sepsis Labs: No results for input(s): PROCALCITON, LATICACIDVEN in the last 168 hours.  Recent Results (from the past 240 hour(s))  MRSA PCR Screening     Status: None   Collection Time: 01/31/19  2:40 AM  Result Value Ref Range Status   MRSA by PCR NEGATIVE NEGATIVE Final    Comment:        The GeneXpert MRSA Assay (FDA approved for NASAL specimens only), is one component of a comprehensive MRSA colonization surveillance program. It is not intended to diagnose MRSA infection nor to guide or monitor treatment for MRSA infections. Performed at [facility], [address] [place], [place] [address]          Radiology Studies: Ct Abdomen Pelvis Wo Contrast  Result Date: 01/30/2019 CLINICAL DATA:  Gross hematuria.  Bilateral flank pain after fall. EXAM: CT ABDOMEN AND PELVIS WITHOUT CONTRAST TECHNIQUE: Multidetector CT imaging of the abdomen and pelvis was performed following the standard protocol without IV contrast. COMPARISON:  CT  scan of January 26, 2019. FINDINGS: Lower chest: No acute abnormality. Hepatobiliary: No gallstones or biliary dilatation is noted. Mild amount of fluid is noted around the liver with dense material seen posteriorly consistent with hematoma. Pancreas: Unremarkable. No pancreatic ductal dilatation or surrounding inflammatory changes. Spleen: Fluid is noted around the spleen which was not present on prior exam and is concerning for hemorrhage. Adrenals/Urinary Tract: Adrenal glands appear normal. Nonobstructive right renal calculus is noted. No hydronephrosis or renal obstruction is noted. The amount of hemorrhage seen in urinary bladder on prior exam is significantly decreased, although residual hemorrhage does remain in the dependent portion of the bladder. Stomach/Bowel: The stomach appears normal. There is no evidence of bowel obstruction or inflammation. The appendix is not clearly visualized. Vascular/Lymphatic: Aortic atherosclerosis. No enlarged abdominal or pelvic lymph nodes. Reproductive: Stable mild prostatic enlargement is noted. Other:  There does appear to be a moderate amount of hemorrhage present in the dependent portion of the pelvis as well as in the left pericolic gutter. Musculoskeletal: No acute or significant osseous findings. IMPRESSION: There is interval development of a moderate amount of intraperitoneal hemorrhage seen in the pelvis, both pericolic gutters, and around the liver and spleen. Critical Value/emergent results were called by telephone at the time of interpretation on 01/30/2019 at 8:29 pm to Dr. Terance Hart , who verbally acknowledged these results. The large amount of hemorrhage seen within the urinary bladder on prior exam has significantly improved, with only a mild amount of residual hemorrhage or thrombus seen posteriorly in the urinary bladder on current exam. Nonobstructive right renal calculus. No hydronephrosis or renal obstruction is noted. Stable mild prostatic enlargement.  Electronically Signed   By: Lupita Raider, M.D.   On: 01/30/2019 20:30   Ct Pelvis Wo Contrast  Result Date: 01/30/2019 CLINICAL DATA:  Considerable free fluid within the abdomen on recent CT, request is been made for CT cystography. EXAM: CT PELVIS WITHOUT CONTRAST TECHNIQUE: Multidetector CT imaging of the pelvis was performed following the standard protocol without intravenous contrast. COMPARISON:  CT from earlier in the same day. FINDINGS: Urinary Tract: Contrast material is been instilled through the Foley catheter. The Foley catheter balloon is well visualized. The bladder distends well and demonstrates diffuse intraluminal filling defect consistent with residual thrombus. Additionally there is extravasation of contrast material from the dome of the bladder in a linear fashion which extends for approximately 2.9 cm consistent with bladder rupture. Contrast enhancement of the intraperitoneal fluid seen on the recent CT examination consistent with the extravasation. Bowel:  Bowel is again within normal limits. Vascular/Lymphatic: Atherosclerotic calcifications are seen. No lymphadenopathy is noted. Reproductive: Prostate is mildly prominent but stable from the recent exam. Other: Free fluid is again noted within the abdomen and pelvis related to the bladder perforation. Musculoskeletal: Stable in appearance when compare with the prior CT. IMPRESSION: CT cystography reveals a long segment of bladder discontinuity along the dome consistent with rupture. Passage of contrast material into the peritoneal cavity is seen. Retained thrombus within the bladder is noted. Electronically Signed   By: Alcide Clever M.D.   On: 01/30/2019 23:20   Dg Chest Portable 1 View  Result Date: 01/30/2019 CLINICAL DATA:  Chest pain EXAM: PORTABLE CHEST 1 VIEW COMPARISON:  11/03/2014 FINDINGS: Cardiac shadows within normal limits. Tortuosity of the thoracic aorta is identified somewhat accentuated by the portable technique and  patient rotation. The lungs are clear. No acute bony abnormality is noted. IMPRESSION: No acute abnormality noted. Electronically Signed   By: Alcide Clever M.D.   On: 01/30/2019 19:55        Scheduled Meds: . finasteride  5 mg Oral Daily  . simvastatin  40 mg Oral q1800  . sodium zirconium cyclosilicate  10 g Oral Q6H   Continuous Infusions: . acetaminophen    . [START ON 02/01/2019] cefTRIAXone (ROCEPHIN)  IV    .  sodium bicarbonate  infusion 1000 mL       LOS: 1 day    Time spent: 35 minutes.     Alba Cory, MD [facility] Pager [phone]  If 7PM-7AM, please contact night-coverage [email or web address] Password [facility] 01/31/2019, 8:18 AM

## 2019-02-01 ENCOUNTER — Inpatient Hospital Stay (HOSPITAL_COMMUNITY): Payer: BLUE CROSS/BLUE SHIELD

## 2019-02-01 LAB — BASIC METABOLIC PANEL
Anion gap: 7 (ref 5–15)
BUN: 20 mg/dL (ref 6–20)
CO2: 25 mmol/L (ref 22–32)
Calcium: 9 mg/dL (ref 8.9–10.3)
Chloride: 102 mmol/L (ref 98–111)
Creatinine, Ser: 1.48 mg/dL — ABNORMAL HIGH (ref 0.61–1.24)
GFR calc Af Amer: 59 mL/min — ABNORMAL LOW (ref >60)
GFR calc non Af Amer: 51 mL/min — ABNORMAL LOW (ref >60)
Glucose, Bld: 159 mg/dL — ABNORMAL HIGH (ref 70–99)
Potassium: 4.8 mmol/L (ref 3.5–5.1)
Sodium: 134 mmol/L — ABNORMAL LOW (ref 135–145)

## 2019-02-01 LAB — CBC
HCT: 40.3 % (ref 39.0–52.0)
Hemoglobin: 13.4 g/dL (ref 13.0–17.0)
MCH: 32.8 pg (ref 26.0–34.0)
MCHC: 33.3 g/dL (ref 30.0–36.0)
MCV: 98.5 fL (ref 80.0–100.0)
Platelets: 168 10*3/uL (ref 150–400)
RBC: 4.09 MIL/uL — ABNORMAL LOW (ref 4.22–5.81)
RDW: 15.1 % (ref 11.5–15.5)
WBC: 14 10*3/uL — ABNORMAL HIGH (ref 4.0–10.5)
nRBC: 0 % (ref 0.0–0.2)

## 2019-02-01 MED ORDER — HEPARIN SODIUM (PORCINE) 5000 UNIT/ML IJ SOLN
5000.0000 [IU] | Freq: Three times a day (TID) | INTRAMUSCULAR | Status: DC
  Administered 2019-02-01 – 2019-02-12 (×21): 5000 [IU] via SUBCUTANEOUS
  Filled 2019-02-01 (×24): qty 1

## 2019-02-01 MED ORDER — PROMETHAZINE HCL 25 MG/ML IJ SOLN
12.5000 mg | Freq: Four times a day (QID) | INTRAMUSCULAR | Status: DC | PRN
  Administered 2019-02-01 – 2019-02-02 (×2): 12.5 mg via INTRAVENOUS
  Filled 2019-02-01 (×2): qty 1

## 2019-02-01 NOTE — Progress Notes (Signed)
PROGRESS NOTE    Caleb KENDALL  Potter:[id number] DOB: [birth year] DOA: 01/30/2019 PCP: [name], MD    Brief Narrative: 58 year old with past medical history significant for hypertension, coronary artery disease, nephrolithiasis who presented to [facility] with for 5 days of worsening abdominal pain.  Patient had experience gross hematuria with clots, he was evaluated in the outpatient setting with a CT abdomen and pelvis on 01/26/2019 showed blood clot.   he was a started on Bactrim.  He was recommended to follow-up with urology as an outpatient.  He present  complaining of worsening abdominal pain.  CT abdomen and pelvis showed possible intraperitoneal hemorrhage.  General surgery and urology was consulted.  Subsequently a CT pyelogram showed a long segment of bladder discontinuity along the dome consistent with a rupture, as well as passage material into the peritoneum.  Patient was taken to the OR for exploratory laparotomy with repair of bladder rupture and drainage of intraperitoneal urine.  He presented with AKI with creatinine level at 7.7 on admission, K level at 6.1.  Patient received IV fluids, Lokelma, amp of bicarb and calcium gluconate.  His renal function has improved significantly with a creatinine today at 1.4.  He was taken emergently to the OR on admission for repair of bladder rupture.  Assessment & Plan:   Principal Problem:   Intraperitoneal rupture of bladder Active Problems:   Intraperitoneal hematoma   Hypertension   Coronary artery disease   AKI (acute kidney injury) (HCC)   Hyperkalemia   Anxiety   1-AKI: Hyperkalemia; Related to prerenal azotemia in the setting of hypotension, bladder rupture, ACE.  He received calcium gluconate, Amp Bicarb.  Continue with IV fluids.  Patient received 2 doses of Lokelma. Renal function has improved, creatinine today at 1.4 creatinine on admission was at 7. Hyperkalemia has resolved.  2-Intraperitoneal Bladder  Rupture;  Underwent exploratory laparotomy and repair 4-13 Management per urology.   3-HTN; hold ace due to AKI.  Hold BP medication due to hypotension.   4-Near syncope; Hypotension; related to hypovolemia and intraperitoneal hematoma.  IV fluids.  Monitor Hb.   HLD;  Hold statin.   Ileus versus early SBO: Patient with abdominal distention, x-ray showed ileus versus early SBO. Discussed with Dr. Annabell Howells if patient developed nausea vomiting we will proceed with NG tube placement.  Mild hyponatremia: Continue with IV fluids.   Estimated body mass index is 35.65 kg/m as calculated from the following:   Height as of this encounter:  (1.676 m).   Weight as of this encounter: 100.2 kg.   DVT prophylaxis: SCD Code Status: Full code Family Communication: care discussed with patient.  Disposition Plan: remain in the hospital.     Procedures:    Antimicrobials: ceftriaxone   Subjective: He is feeling very uncomfortable today due to abdominal distention.  No vomiting.   Objective: Vitals:   02/01/19 0500 02/01/19 0600 02/01/19 0700 02/01/19 0800  BP: 124/83 (!) 130/94 116/66   Pulse: 89 99 93   Resp: (!) 23 (!) 24 (!) 21   Temp:    98.1 F (36.7 C)  TempSrc:    Oral  SpO2: 96% 92% 94%   Weight:      Height:        Intake/Output Summary (Last 24 hours) at 02/01/2019 0852 Last data filed at 02/01/2019 0800 Gross per 24 hour  Intake 3214.31 ml  Output 3385 ml  Net -170.69 ml   Filed Weights   01/30/19 1841  01/31/19 0300  Weight: 97.5 kg 100.2 kg    Examination:  General exam: No acute distress Respiratory system: Clear to auscultation Cardiovascular system: 1, S2 regular rhythm and rate Gastrointestinal system: Abdomen very distended, decreased bowel sounds, Central nervous system: Alert and oriented Extremities: Symmetric power Skin: No rashes, lesions or ulcers    Data Reviewed: I have personally reviewed following labs and imaging  studies  CBC: Recent Labs  Lab 01/30/19 1920 01/30/19 2241 01/31/19 0302  WBC 6.7 11.3*  --   NEUTROABS 4.2  --   --   HGB 11.7* 11.6* 12.5*  HCT 35.9* 35.9* 38.5*  MCV 102.6* 100.3*  --   PLT 176 142*  --    Basic Metabolic Panel: Recent Labs  Lab 01/30/19 1920 01/31/19 0302 01/31/19 0815 01/31/19 1427 01/31/19 2039  NA 134* 135 136  --   --   K 6.1* 6.6* 5.1 4.8 5.0  CL 101 105 105  --   --   CO2 21* 19* 23  --   --   GLUCOSE 120* 147* 86  --   --   BUN 64* 59* 47*  --   --   CREATININE 7.76* 6.41* 4.52*  --   --   CALCIUM 9.4 8.0* 8.2*  --   --    GFR: Estimated Creatinine Clearance: 19.5 mL/min (A) (by C-G formula based on SCr of 4.52 mg/dL (H)). Liver Function Tests: Recent Labs  Lab 01/30/19 1920  AST 36  ALT 30  ALKPHOS 65  BILITOT 1.0  PROT 6.5  ALBUMIN 4.2   No results for input(s): LIPASE, AMYLASE in the last 168 hours. No results for input(s): AMMONIA in the last 168 hours. Coagulation Profile: Recent Labs  Lab 01/30/19 1920  INR 1.0   Cardiac Enzymes: Recent Labs  Lab 01/30/19 1920  TROPONINI <0.03   BNP (last 3 results) No results for input(s): PROBNP in the last 8760 hours. HbA1C: No results for input(s): HGBA1C in the last 72 hours. CBG: Recent Labs  Lab 01/31/19 0510 01/31/19 2258 01/31/19 2315 01/31/19 2337  GLUCAP 131* 60* 60* 126*   Lipid Profile: No results for input(s): CHOL, HDL, LDLCALC, TRIG, CHOLHDL, LDLDIRECT in the last 72 hours. Thyroid Function Tests: No results for input(s): TSH, T4TOTAL, FREET4, T3FREE, THYROIDAB in the last 72 hours. Anemia Panel: No results for input(s): VITAMINB12, FOLATE, FERRITIN, TIBC, IRON, RETICCTPCT in the last 72 hours. Sepsis Labs: No results for input(s): PROCALCITON, LATICACIDVEN in the last 168 hours.  Recent Results (from the past 240 hour(s))  MRSA PCR Screening     Status: None   Collection Time: 01/31/19  2:40 AM  Result Value Ref Range Status   MRSA by PCR NEGATIVE  NEGATIVE Final    Comment:        The GeneXpert MRSA Assay (FDA approved for NASAL specimens only), is one component of a comprehensive MRSA colonization surveillance program. It is not intended to diagnose MRSA infection nor to guide or monitor treatment for MRSA infections. Performed at [facility], [address], [place], [place] [address]          Radiology Studies: Ct Abdomen Pelvis Wo Contrast  Result Date: 01/30/2019 CLINICAL DATA:  Gross hematuria.  Bilateral flank pain after fall. EXAM: CT ABDOMEN AND PELVIS WITHOUT CONTRAST TECHNIQUE: Multidetector CT imaging of the abdomen and pelvis was performed following the standard protocol without IV contrast. COMPARISON:  CT scan of January 26, 2019. FINDINGS: Lower chest: No acute  abnormality. Hepatobiliary: No gallstones or biliary dilatation is noted. Mild amount of fluid is noted around the liver with dense material seen posteriorly consistent with hematoma. Pancreas: Unremarkable. No pancreatic ductal dilatation or surrounding inflammatory changes. Spleen: Fluid is noted around the spleen which was not present on prior exam and is concerning for hemorrhage. Adrenals/Urinary Tract: Adrenal glands appear normal. Nonobstructive right renal calculus is noted. No hydronephrosis or renal obstruction is noted. The amount of hemorrhage seen in urinary bladder on prior exam is significantly decreased, although residual hemorrhage does remain in the dependent portion of the bladder. Stomach/Bowel: The stomach appears normal. There is no evidence of bowel obstruction or inflammation. The appendix is not clearly visualized. Vascular/Lymphatic: Aortic atherosclerosis. No enlarged abdominal or pelvic lymph nodes. Reproductive: Stable mild prostatic enlargement is noted. Other: There does appear to be a moderate amount of hemorrhage present in the dependent portion of the pelvis as well as in the left pericolic gutter. Musculoskeletal: No acute or  significant osseous findings. IMPRESSION: There is interval development of a moderate amount of intraperitoneal hemorrhage seen in the pelvis, both pericolic gutters, and around the liver and spleen. Critical Value/emergent results were called by telephone at the time of interpretation on 01/30/2019 at 8:29 pm to Dr. Terance HartKELLY GEKAS , who verbally acknowledged these results. The large amount of hemorrhage seen within the urinary bladder on prior exam has significantly improved, with only a mild amount of residual hemorrhage or thrombus seen posteriorly in the urinary bladder on current exam. Nonobstructive right renal calculus. No hydronephrosis or renal obstruction is noted. Stable mild prostatic enlargement. Electronically Signed   By: Lupita RaiderJames  Green Jr, M.D.   On: 01/30/2019 20:30   Ct Pelvis Wo Contrast  Result Date: 01/30/2019 CLINICAL DATA:  Considerable free fluid within the abdomen on recent CT, request is been made for CT cystography. EXAM: CT PELVIS WITHOUT CONTRAST TECHNIQUE: Multidetector CT imaging of the pelvis was performed following the standard protocol without intravenous contrast. COMPARISON:  CT from earlier in the same day. FINDINGS: Urinary Tract: Contrast material is been instilled through the Foley catheter. The Foley catheter balloon is well visualized. The bladder distends well and demonstrates diffuse intraluminal filling defect consistent with residual thrombus. Additionally there is extravasation of contrast material from the dome of the bladder in a linear fashion which extends for approximately 2.9 cm consistent with bladder rupture. Contrast enhancement of the intraperitoneal fluid seen on the recent CT examination consistent with the extravasation. Bowel:  Bowel is again within normal limits. Vascular/Lymphatic: Atherosclerotic calcifications are seen. No lymphadenopathy is noted. Reproductive: Prostate is mildly prominent but stable from the recent exam. Other: Free fluid is again  noted within the abdomen and pelvis related to the bladder perforation. Musculoskeletal: Stable in appearance when compare with the prior CT. IMPRESSION: CT cystography reveals a long segment of bladder discontinuity along the dome consistent with rupture. Passage of contrast material into the peritoneal cavity is seen. Retained thrombus within the bladder is noted. Electronically Signed   By: Alcide CleverMark  Lukens M.D.   On: 01/30/2019 23:20   Dg Chest Portable 1 View  Result Date: 01/30/2019 CLINICAL DATA:  Chest pain EXAM: PORTABLE CHEST 1 VIEW COMPARISON:  11/03/2014 FINDINGS: Cardiac shadows within normal limits. Tortuosity of the thoracic aorta is identified somewhat accentuated by the portable technique and patient rotation. The lungs are clear. No acute bony abnormality is noted. IMPRESSION: No acute abnormality noted. Electronically Signed   By: Alcide CleverMark  Lukens M.D.   On: 01/30/2019  19:55   Dg Abd Portable 1v  Result Date: 02/01/2019 CLINICAL DATA:  Abdominal distension. EXAM: PORTABLE ABDOMEN - 1 VIEW COMPARISON:  CT scan January 30, 2019 FINDINGS: Lung bases are normal. No free air, portal venous gas, or pneumatosis on the supine view. Dilated loops of small bowel are identified. There is also air seen within the ascending and transverse colon which are mildly prominent. The dilated small bowel loops measure up to 5.2 cm. The transverse colon measures 6.6 cm. No other acute abnormalities. IMPRESSION: 1. Dilated loops of small bowel and mildly prominent loops of colon. The small bowel appears to be dilated out of proportion to the colon suggesting early or partial small bowel obstruction. Given the amount of air in the colon, a developing ileus is also a possibility. Recommend clinical correlation. A CT scan could further evaluate if clinically warranted. Otherwise, recommend attention on close follow-up. These results will be called to the ordering clinician or representative by the Radiologist Assistant, and  communication documented in the PACS or zVision Dashboard. Electronically Signed   By: Gerome Sam III M.D   On: 02/01/2019 08:18        Scheduled Meds:  finasteride  5 mg Oral Daily   heparin injection (subcutaneous)  5,000 Units Subcutaneous Q8H   Continuous Infusions:  cefTRIAXone (ROCEPHIN)  IV Stopped (02/01/19 0215)   dextrose 5 % and 0.9% NaCl 125 mL/hr at 02/01/19 0800     LOS: 2 days    Time spent: 35 minutes.     Alba Cory, MD [facility] Pager [phone]  If 7PM-7AM, please contact night-coverage [email or web address] Password [facility] 02/01/2019, 8:52 AM

## 2019-02-01 NOTE — Progress Notes (Signed)
2 Days Post-Op  Subjective: CC: Nausea.  Hx: Caleb LongsJoseph is POD 2 from a repair of bladder rupture.  He has increased distention with nausea and rectal urgency.  His Cr was falling yesterday and the K has normalized.  He is otherwise feeling better.   ROS:  Review of Systems  Constitutional: Negative for chills and fever.  Respiratory: Negative for shortness of breath.   Cardiovascular: Negative for chest pain.  Gastrointestinal: Positive for nausea.    Anti-infectives: Anti-infectives (From admission, onward)   Start     Dose/Rate Route Frequency Ordered Stop   02/01/19 0100  cefTRIAXone (ROCEPHIN) 2 g in sodium chloride 0.9 % 100 mL IVPB     2 g 200 mL/hr over 30 Minutes Intravenous Every 24 hours 01/31/19 0223     01/31/19 0030  cefTRIAXone (ROCEPHIN) 2 g in sodium chloride 0.9 % 100 mL IVPB     2 g 200 mL/hr over 30 Minutes Intravenous  Once 01/31/19 0015 01/31/19 0240      Current Facility-Administered Medications  Medication Dose Route Frequency Provider Last Rate Last Dose  . acetaminophen (TYLENOL) tablet 650 mg  650 mg Oral Q6H PRN Opyd, Lavone Neriimothy S, MD       Or  . acetaminophen (TYLENOL) suppository 650 mg  650 mg Rectal Q6H PRN Opyd, Lavone Neriimothy S, MD      . bisacodyl (DULCOLAX) suppository 10 mg  10 mg Rectal Daily PRN Bjorn PippinWrenn, Welden Hausmann, MD      . cefTRIAXone (ROCEPHIN) 2 g in sodium chloride 0.9 % 100 mL IVPB  2 g Intravenous Q24H Bjorn PippinWrenn, Linday Rhodes, MD   Stopped at 02/01/19 0215  . dextrose 5 %-0.9 % sodium chloride infusion   Intravenous Continuous Regalado, Belkys A, MD 125 mL/hr at 02/01/19 0653    . diphenhydrAMINE (BENADRYL) injection 12.5-25 mg  12.5-25 mg Intravenous Q6H PRN Bjorn PippinWrenn, Atley Neubert, MD       Or  . diphenhydrAMINE (BENADRYL) 12.5 MG/5ML elixir 12.5-25 mg  12.5-25 mg Oral Q6H PRN Bjorn PippinWrenn, Tricia Pledger, MD      . finasteride (PROSCAR) tablet 5 mg  5 mg Oral Daily Bjorn PippinWrenn, Vida Nicol, MD   5 mg at 01/31/19 1053  . HYDROcodone-acetaminophen (NORCO/VICODIN) 5-325 MG per tablet 1-2 tablet  1-2 tablet  Oral Q4H PRN Opyd, Lavone Neriimothy S, MD      . HYDROmorphone (DILAUDID) injection 0.5-1 mg  0.5-1 mg Intravenous Q2H PRN Bjorn PippinWrenn, Esty Ahuja, MD   1 mg at 02/01/19 0615  . morphine 4 MG/ML injection 4 mg  4 mg Intravenous Q4H PRN Opyd, Lavone Neriimothy S, MD      . ondansetron (ZOFRAN) tablet 4 mg  4 mg Oral Q6H PRN Opyd, Lavone Neriimothy S, MD       Or  . ondansetron (ZOFRAN) injection 4 mg  4 mg Intravenous Q6H PRN Opyd, Lavone Neriimothy S, MD   4 mg at 02/01/19 0444  . oxybutynin (DITROPAN) tablet 5 mg  5 mg Oral Q8H PRN Bjorn PippinWrenn, Derelle Cockrell, MD      . oxyCODONE (Oxy IR/ROXICODONE) immediate release tablet 5 mg  5 mg Oral Q4H PRN Bjorn PippinWrenn, Alec Jaros, MD      . promethazine (PHENERGAN) injection 12.5 mg  12.5 mg Intravenous Q6H PRN Bjorn PippinWrenn, Josefa Syracuse, MD      . senna-docusate (Senokot-S) tablet 1 tablet  1 tablet Oral QHS PRN Bjorn PippinWrenn, Shley Dolby, MD      . sodium phosphate (FLEET) 7-19 GM/118ML enema 1 enema  1 enema Rectal Once PRN Bjorn PippinWrenn, Heylee Tant, MD      . zolpidem Remus Loffler(AMBIEN)  tablet 5 mg  5 mg Oral QHS PRN,MR X 1 Bjorn Pippin, MD         Objective: Vital signs in last 24 hours: Temp:  [97.6 F (36.4 C)-98 F (36.7 C)] 97.8 F (36.6 C) (04/14 0400) Pulse Rate:  [78-105] 99 (04/14 0600) Resp:  [7-27] 24 (04/14 0600) BP: (90-130)/(60-94) 130/94 (04/14 0600) SpO2:  [88 %-98 %] 92 % (04/14 0600)  Intake/Output from previous day: 04/13 0701 - 04/14 0700 In: 2964.8 [I.V.:2184.2; IV Piggyback:780.7] Out: 3635 [Urine:3610; Drains:25] Intake/Output this shift: Total I/O In: 1277.1 [I.V.:1009.4; IV Piggyback:267.7] Out: 1300 [Urine:1300]   Physical Exam Vitals signs reviewed.  Constitutional:      Appearance: Normal appearance. He is obese.  Cardiovascular:     Rate and Rhythm: Normal rate and regular rhythm.  Pulmonary:     Effort: Pulmonary effort is normal. No respiratory distress.     Breath sounds: Normal breath sounds.  Abdominal:     Comments: Obese with tense distention and minimal BS.  Wound intact without erythema.    Genitourinary:     Comments: Foley is draining clear urine.  Musculoskeletal: Normal range of motion.        General: No swelling or tenderness.  Skin:    General: Skin is warm and dry.  Neurological:     General: No focal deficit present.     Mental Status: He is alert and oriented to person, place, and time.  Psychiatric:        Mood and Affect: Mood normal.        Behavior: Behavior normal.     Lab Results:  Recent Labs    01/30/19 1920 01/30/19 2241 01/31/19 0302  WBC 6.7 11.3*  --   HGB 11.7* 11.6* 12.5*  HCT 35.9* 35.9* 38.5*  PLT 176 142*  --    BMET Recent Labs    01/31/19 0302 01/31/19 0815 01/31/19 1427 01/31/19 2039  NA 135 136  --   --   K 6.6* 5.1 4.8 5.0  CL 105 105  --   --   CO2 19* 23  --   --   GLUCOSE 147* 86  --   --   BUN 59* 47*  --   --   CREATININE 6.41* 4.52*  --   --   CALCIUM 8.0* 8.2*  --   --    PT/INR Recent Labs    01/30/19 1920  LABPROT 13.4  INR 1.0   ABG No results for input(s): PHART, HCO3 in the last 72 hours.  Invalid input(s): PCO2, PO2  Studies/Results: Ct Abdomen Pelvis Wo Contrast  Result Date: 01/30/2019 CLINICAL DATA:  Gross hematuria.  Bilateral flank pain after fall. EXAM: CT ABDOMEN AND PELVIS WITHOUT CONTRAST TECHNIQUE: Multidetector CT imaging of the abdomen and pelvis was performed following the standard protocol without IV contrast. COMPARISON:  CT scan of January 26, 2019. FINDINGS: Lower chest: No acute abnormality. Hepatobiliary: No gallstones or biliary dilatation is noted. Mild amount of fluid is noted around the liver with dense material seen posteriorly consistent with hematoma. Pancreas: Unremarkable. No pancreatic ductal dilatation or surrounding inflammatory changes. Spleen: Fluid is noted around the spleen which was not present on prior exam and is concerning for hemorrhage. Adrenals/Urinary Tract: Adrenal glands appear normal. Nonobstructive right renal calculus is noted. No hydronephrosis or renal obstruction is noted.  The amount of hemorrhage seen in urinary bladder on prior exam is significantly decreased, although residual hemorrhage does remain in the dependent portion  of the bladder. Stomach/Bowel: The stomach appears normal. There is no evidence of bowel obstruction or inflammation. The appendix is not clearly visualized. Vascular/Lymphatic: Aortic atherosclerosis. No enlarged abdominal or pelvic lymph nodes. Reproductive: Stable mild prostatic enlargement is noted. Other: There does appear to be a moderate amount of hemorrhage present in the dependent portion of the pelvis as well as in the left pericolic gutter. Musculoskeletal: No acute or significant osseous findings. IMPRESSION: There is interval development of a moderate amount of intraperitoneal hemorrhage seen in the pelvis, both pericolic gutters, and around the liver and spleen. Critical Value/emergent results were called by telephone at the time of interpretation on 01/30/2019 at 8:29 pm to Dr. Terance Hart , who verbally acknowledged these results. The large amount of hemorrhage seen within the urinary bladder on prior exam has significantly improved, with only a mild amount of residual hemorrhage or thrombus seen posteriorly in the urinary bladder on current exam. Nonobstructive right renal calculus. No hydronephrosis or renal obstruction is noted. Stable mild prostatic enlargement. Electronically Signed   By: Lupita Raider, M.D.   On: 01/30/2019 20:30   Ct Pelvis Wo Contrast  Result Date: 01/30/2019 CLINICAL DATA:  Considerable free fluid within the abdomen on recent CT, request is been made for CT cystography. EXAM: CT PELVIS WITHOUT CONTRAST TECHNIQUE: Multidetector CT imaging of the pelvis was performed following the standard protocol without intravenous contrast. COMPARISON:  CT from earlier in the same day. FINDINGS: Urinary Tract: Contrast material is been instilled through the Foley catheter. The Foley catheter balloon is well visualized. The bladder  distends well and demonstrates diffuse intraluminal filling defect consistent with residual thrombus. Additionally there is extravasation of contrast material from the dome of the bladder in a linear fashion which extends for approximately 2.9 cm consistent with bladder rupture. Contrast enhancement of the intraperitoneal fluid seen on the recent CT examination consistent with the extravasation. Bowel:  Bowel is again within normal limits. Vascular/Lymphatic: Atherosclerotic calcifications are seen. No lymphadenopathy is noted. Reproductive: Prostate is mildly prominent but stable from the recent exam. Other: Free fluid is again noted within the abdomen and pelvis related to the bladder perforation. Musculoskeletal: Stable in appearance when compare with the prior CT. IMPRESSION: CT cystography reveals a long segment of bladder discontinuity along the dome consistent with rupture. Passage of contrast material into the peritoneal cavity is seen. Retained thrombus within the bladder is noted. Electronically Signed   By: Alcide Clever M.D.   On: 01/30/2019 23:20   Dg Chest Portable 1 View  Result Date: 01/30/2019 CLINICAL DATA:  Chest pain EXAM: PORTABLE CHEST 1 VIEW COMPARISON:  11/03/2014 FINDINGS: Cardiac shadows within normal limits. Tortuosity of the thoracic aorta is identified somewhat accentuated by the portable technique and patient rotation. The lungs are clear. No acute bony abnormality is noted. IMPRESSION: No acute abnormality noted. Electronically Signed   By: Alcide Clever M.D.   On: 01/30/2019 19:55   Labs from 4/13 reviewed.   Assessment and Plan: BPH with BOO with clot retention and bladder rupture s/p repair.  He has an ileus with distention and nausea.   I will get baseline abdominal films.  Dulcolax suppository recommended and he was encouraged to walk.  I will keep him NPO.  AKI with hyperkalemia continues to improve but AM labs are pending.    With clear urine, I will add SQ heparin  for DVT prophylaxis.       LOS: 2 days    Bjorn Pippin 02/01/2019  [phone] ID: Caleb Potter, male   DOB: Jul 02, 1961, 58 y.o.   MRN: [id number]

## 2019-02-01 NOTE — Plan of Care (Signed)
  Problem: Education: Goal: Knowledge of General Education information will improve Description Including pain rating scale, medication(s)/side effects and non-pharmacologic comfort measures Outcome: Progressing   Problem: Activity: Goal: Risk for activity intolerance will decrease Outcome: Not Progressing  Pt refused motility and walks today.   Problem: Nutrition: Goal: Adequate nutrition will be maintained Outcome: Not Met (add Reason)  Pt made NPO due to bowel blockage.   Problem: Elimination: Goal: Will not experience complications related to bowel motility Outcome: Not Met (add Reason)  ABD XR confirmed bowel blockage, increased nausea lead to NG tube placement.

## 2019-02-01 NOTE — Progress Notes (Signed)
Informed MD Bjorn Pippin of pt's increasing pain and nausea. Verbal order received to insert NG tube.

## 2019-02-02 LAB — BASIC METABOLIC PANEL
Anion gap: 11 (ref 5–15)
BUN: 17 mg/dL (ref 6–20)
CO2: 24 mmol/L (ref 22–32)
Calcium: 8.8 mg/dL — ABNORMAL LOW (ref 8.9–10.3)
Chloride: 102 mmol/L (ref 98–111)
Creatinine, Ser: 1.11 mg/dL (ref 0.61–1.24)
GFR calc Af Amer: >60 mL/min (ref >60)
GFR calc non Af Amer: >60 mL/min (ref >60)
Glucose, Bld: 134 mg/dL — ABNORMAL HIGH (ref 70–99)
Potassium: 4.5 mmol/L (ref 3.5–5.1)
Sodium: 137 mmol/L (ref 135–145)

## 2019-02-02 LAB — CBC
HCT: 40.8 % (ref 39.0–52.0)
Hemoglobin: 13.6 g/dL (ref 13.0–17.0)
MCH: 32.8 pg (ref 26.0–34.0)
MCHC: 33.3 g/dL (ref 30.0–36.0)
MCV: 98.3 fL (ref 80.0–100.0)
Platelets: 190 10*3/uL (ref 150–400)
RBC: 4.15 MIL/uL — ABNORMAL LOW (ref 4.22–5.81)
RDW: 14.7 % (ref 11.5–15.5)
WBC: 10.9 10*3/uL — ABNORMAL HIGH (ref 4.0–10.5)
nRBC: 0 % (ref 0.0–0.2)

## 2019-02-02 MED ORDER — LACTATED RINGERS IV SOLN
INTRAVENOUS | Status: DC
  Administered 2019-02-02 – 2019-02-09 (×4): via INTRAVENOUS

## 2019-02-02 MED ORDER — SODIUM CHLORIDE 0.9 % IV BOLUS
500.0000 mL | Freq: Once | INTRAVENOUS | Status: AC
  Administered 2019-02-02: 500 mL via INTRAVENOUS

## 2019-02-02 NOTE — Progress Notes (Signed)
3 Days Post-Op  Subjective: Caleb Potter had progressive nausea and distention and an abdominal film demonstrated dilated bowel loops consistent with ileus vs early SBO.  NG was placed and he is feeling better today but has had minimal flatus.  His Cr and potassium have normalized.   Pain control is good.  ROS:  Review of Systems  Constitutional: Negative for chills and fever.  Respiratory: Negative for shortness of breath.   Cardiovascular: Negative for chest pain.  Gastrointestinal: Negative for nausea and vomiting.    Anti-infectives: Anti-infectives (From admission, onward)   Start     Dose/Rate Route Frequency Ordered Stop   02/01/19 0100  cefTRIAXone (ROCEPHIN) 2 g in sodium chloride 0.9 % 100 mL IVPB     2 g 200 mL/hr over 30 Minutes Intravenous Every 24 hours 01/31/19 0223     01/31/19 0030  cefTRIAXone (ROCEPHIN) 2 g in sodium chloride 0.9 % 100 mL IVPB     2 g 200 mL/hr over 30 Minutes Intravenous  Once 01/31/19 0015 01/31/19 0240      Current Facility-Administered Medications  Medication Dose Route Frequency Provider Last Rate Last Dose  . acetaminophen (TYLENOL) tablet 650 mg  650 mg Oral Q6H PRN Opyd, Lavone Neri, MD       Or  . acetaminophen (TYLENOL) suppository 650 mg  650 mg Rectal Q6H PRN Opyd, Lavone Neri, MD      . bisacodyl (DULCOLAX) suppository 10 mg  10 mg Rectal Daily PRN Bjorn Pippin, MD      . cefTRIAXone (ROCEPHIN) 2 g in sodium chloride 0.9 % 100 mL IVPB  2 g Intravenous Q24H Bjorn Pippin, MD 200 mL/hr at 02/02/19 0200 2 g at 02/02/19 0200  . dextrose 5 %-0.9 % sodium chloride infusion   Intravenous Continuous Regalado, Belkys A, MD 100 mL/hr at 02/02/19 0600    . diphenhydrAMINE (BENADRYL) injection 12.5-25 mg  12.5-25 mg Intravenous Q6H PRN Bjorn Pippin, MD       Or  . diphenhydrAMINE (BENADRYL) 12.5 MG/5ML elixir 12.5-25 mg  12.5-25 mg Oral Q6H PRN Bjorn Pippin, MD      . finasteride (PROSCAR) tablet 5 mg  5 mg Oral Daily Bjorn Pippin, MD   5 mg at 01/31/19 1053  .  heparin injection 5,000 Units  5,000 Units Subcutaneous Q8H Bjorn Pippin, MD   5,000 Units at 02/01/19 2257  . HYDROcodone-acetaminophen (NORCO/VICODIN) 5-325 MG per tablet 1-2 tablet  1-2 tablet Oral Q4H PRN Opyd, Lavone Neri, MD      . HYDROmorphone (DILAUDID) injection 0.5-1 mg  0.5-1 mg Intravenous Q2H PRN Bjorn Pippin, MD   1 mg at 02/01/19 2303  . morphine 4 MG/ML injection 4 mg  4 mg Intravenous Q4H PRN Opyd, Lavone Neri, MD      . ondansetron (ZOFRAN) tablet 4 mg  4 mg Oral Q6H PRN Opyd, Lavone Neri, MD       Or  . ondansetron (ZOFRAN) injection 4 mg  4 mg Intravenous Q6H PRN Opyd, Lavone Neri, MD   4 mg at 02/01/19 1237  . oxybutynin (DITROPAN) tablet 5 mg  5 mg Oral Q8H PRN Bjorn Pippin, MD      . oxyCODONE (Oxy IR/ROXICODONE) immediate release tablet 5 mg  5 mg Oral Q4H PRN Bjorn Pippin, MD      . promethazine (PHENERGAN) injection 12.5 mg  12.5 mg Intravenous Q6H PRN Bjorn Pippin, MD   12.5 mg at 02/02/19 0206  . senna-docusate (Senokot-S) tablet 1 tablet  1 tablet Oral  QHS PRN Bjorn Pippin, MD      . sodium phosphate (FLEET) 7-19 GM/118ML enema 1 enema  1 enema Rectal Once PRN Bjorn Pippin, MD      . zolpidem (AMBIEN) tablet 5 mg  5 mg Oral QHS PRN,MR X 1 Bjorn Pippin, MD         Objective: Vital signs in last 24 hours: Temp:  [97.8 F (36.6 C)-99 F (37.2 C)] 98.2 F (36.8 C) (04/15 0400) Pulse Rate:  [86-125] 125 (04/15 0600) Resp:  [6-27] 12 (04/15 0600) BP: (109-141)/(66-99) 118/94 (04/15 0600) SpO2:  [93 %-99 %] 94 % (04/15 0600)  Intake/Output from previous day: 04/14 0701 - 04/15 0700 In: 2382.6 [I.V.:2366.7; IV Piggyback:15.9] Out: 2060 [Urine:1280; Emesis/NG output:500; Drains:280] Intake/Output this shift: Total I/O In: 1108.4 [I.V.:1092.5; IV Piggyback:15.9] Out: 825 [Urine:525; Emesis/NG output:50; Drains:250]   Physical Exam Constitutional:      Appearance: Normal appearance. He is obese.  Neck:     Musculoskeletal: Normal range of motion and neck supple.   Cardiovascular:     Rate and Rhythm: Regular rhythm. Tachycardia present.     Heart sounds: Normal heart sounds.  Pulmonary:     Effort: Pulmonary effort is normal. No respiratory distress.     Breath sounds: Normal breath sounds.  Abdominal:     General: There is distension.     Tenderness: There is no abdominal tenderness.     Comments: Obese.  Wound intact.  BS present but quiet.   Genitourinary:    Comments: Foley draining clear urine.  Musculoskeletal: Normal range of motion.        General: No swelling or tenderness.  Skin:    General: Skin is warm and dry.  Neurological:     General: No focal deficit present.     Mental Status: He is alert and oriented to person, place, and time.     Lab Results:  Recent Labs    02/01/19 0848 02/02/19 0226  WBC 14.0* 10.9*  HGB 13.4 13.6  HCT 40.3 40.8  PLT 168 190   BMET Recent Labs    02/01/19 0848 02/02/19 0226  NA 134* 137  K 4.8 4.5  CL 102 102  CO2 25 24  GLUCOSE 159* 134*  BUN 20 17  CREATININE 1.48* 1.11  CALCIUM 9.0 8.8*   PT/INR Recent Labs    01/30/19 1920  LABPROT 13.4  INR 1.0   ABG No results for input(s): PHART, HCO3 in the last 72 hours.  Invalid input(s): PCO2, PO2  Studies/Results: Dg Abd 1 View  Result Date: 02/01/2019 CLINICAL DATA:  Enteric tube placement EXAM: ABDOMEN - 1 VIEW COMPARISON:  None. FINDINGS: The tip of the enteric tube is at the expected location of the gastroduodenal junction or first portion of the duodenum. The side port is still likely within the stomach. There are multiple dilated loops of bowel throughout the abdomen. IMPRESSION: Enteric tube tip near the gastroduodenal junction. Electronically Signed   By: Deatra Robinson M.D.   On: 02/01/2019 14:09   Dg Abd Portable 1v  Result Date: 02/01/2019 CLINICAL DATA:  Abdominal distension. EXAM: PORTABLE ABDOMEN - 1 VIEW COMPARISON:  CT scan January 30, 2019 FINDINGS: Lung bases are normal. No free air, portal venous gas, or  pneumatosis on the supine view. Dilated loops of small bowel are identified. There is also air seen within the ascending and transverse colon which are mildly prominent. The dilated small bowel loops measure up to 5.2 cm. The transverse colon  measures 6.6 cm. No other acute abnormalities. IMPRESSION: 1. Dilated loops of small bowel and mildly prominent loops of colon. The small bowel appears to be dilated out of proportion to the colon suggesting early or partial small bowel obstruction. Given the amount of air in the colon, a developing ileus is also a possibility. Recommend clinical correlation. A CT scan could further evaluate if clinically warranted. Otherwise, recommend attention on close follow-up. These results will be called to the ordering clinician or representative by the Radiologist Assistant, and communication documented in the PACS or zVision Dashboard. Electronically Signed   By: Gerome Samavid  Williams III M.D   On: 02/01/2019 08:18     Assessment and Plan: Clot retention with bladder rupture s/p repair.   Wound intact.  Urine clear.   AKI from peritoneal urine absorption resolved.  Post op ileus with small possibility of SBO now with NG.  Continue NPO and NG drainage pending resolution of ileus and will continue re-involving general surgery if this doesn't resolve in a timely fashion..        LOS: 3 days    Bjorn PippinJohn Mikyle Sox 02/02/2019 [phone] ID: Caleb Potter, male   DOB: 03-09-1961, 58 y.o.   MRN: [id number]

## 2019-02-02 NOTE — Anesthesia Postprocedure Evaluation (Signed)
Anesthesia Post Note  Patient: Caleb Potter  Procedure(s) Performed: EXPLORATORY LAPAROTOMY (N/A ) Bladder Repair     Patient location during evaluation: PACU Anesthesia Type: General Level of consciousness: sedated and patient cooperative Pain management: pain level controlled Vital Signs Assessment: post-procedure vital signs reviewed and stable Respiratory status: spontaneous breathing Cardiovascular status: stable Anesthetic complications: no    Last Vitals:  Vitals:   02/02/19 0600 02/02/19 0700  BP: (!) 118/94 (!) 108/52  Pulse: (!) 125 (!) 113  Resp: 12 (!) 22  Temp:    SpO2: 94% 94%    Last Pain:  Vitals:   02/02/19 0400  TempSrc: Axillary  PainSc:                  Lewie Loron

## 2019-02-02 NOTE — Progress Notes (Signed)
PROGRESS NOTE    Caleb ChurchJoseph T Potter  JWJ:[id number] DOB: [birth year] DOA: 01/30/2019 PCP: Richardean Chimeraaniel, Terry G, MD   Brief Narrative:  Per HPI: 58 year old with past medical history significant for hypertension, coronary artery disease, nephrolithiasis who presented to [facility] with for 5 days of worsening abdominal pain.  Patient had experience gross hematuria with clots, he was evaluated in the outpatient setting with a CT abdomen and pelvis on 01/26/2019 showed blood clot.   he was a started on Bactrim.  He was recommended to follow-up with urology as an outpatient.  He present  complaining of worsening abdominal pain.  CT abdomen and pelvis showed possible intraperitoneal hemorrhage.  General surgery and urology was consulted.  Subsequently a CT pyelogram showed a long segment of bladder discontinuity along the dome consistent with a rupture, as well as passage material into the peritoneum.  Patient was taken to the OR for exploratory laparotomy with repair of bladder rupture and drainage of intraperitoneal urine.  He presented with AKI with creatinine level at 7.7 on admission, K level at 6.1.  Patient received IV fluids, Lokelma, amp of bicarb and calcium gluconate.  His renal function has improved significantly with a creatinine today at 1.4.  He was taken emergently to the OR on admission for repair of bladder rupture on 4/13.   Assessment & Plan:   Principal Problem:   Intraperitoneal rupture of bladder Active Problems:   Intraperitoneal hematoma   Hypertension   Coronary artery disease   AKI (acute kidney injury) (HCC)   Hyperkalemia   Anxiety   1. Intraperitoneal bladder rupture status post exploratory laparotomy and repair 4/13.  Management per urology; okay with transfer to MedSurg floor today. 2. AKI with hyperkalemia.  Potassium levels are much improved this morning as well as AKI with creatinine downtrending.  We will recheck labs in a.m. and maintain on IV fluids. 3.  Ileus versus early SBO.  Continue on NG tube to suction with IV fluid.  Ice chips for now.  May need general surgical consultation if symptoms do not improve.  Suppository given today. 4. Hypertension-controlled.  Holding ACE inhibitor for now. 5. Near syncope with hypotension.  This was related to hypovolemia initially.  Will closely monitor blood pressures.  Stable for transfer to MedSurg.   DVT prophylaxis: SCDs Code Status: Full Family Communication: Discussed with patient Disposition Plan: Continue current care with NG tube and IV fluid okay to transfer to MedSurg floor.   Consultants:   Urology  Procedures:   Exploratory laparotomy on 4/13  Antimicrobials:   Rocephin 4/14->   Subjective: Patient seen and evaluated today with no new acute complaints or concerns. No acute concerns or events noted overnight.  He has not had any flatus or bowel movement.  Denies any nausea vomiting or abdominal pain today.  Continues to have high output per NG tube with approximately 300 to 400 mL noted overnight.  Objective: Vitals:   02/02/19 0500 02/02/19 0600 02/02/19 0700 02/02/19 0800  BP: 115/86 (!) 118/94 (!) 108/52 95/73  Pulse: (!) 111 (!) 125 (!) 113 (!) 118  Resp: 19 12 (!) 22 (!) 24  Temp:    99.4 F (37.4 C)  TempSrc:    Oral  SpO2: 93% 94% 94% 96%  Weight:      Height:        Intake/Output Summary (Last 24 hours) at 02/02/2019 0914 Last data filed at 02/02/2019 0800 Gross per 24 hour  Intake 2457.48 ml  Output 1835  ml  Net 622.48 ml   Filed Weights   01/30/19 1841 01/31/19 0300  Weight: 97.5 kg 100.2 kg    Examination:  General exam: Appears calm and comfortable  Respiratory system: Clear to auscultation. Respiratory effort normal. Cardiovascular system: S1 & S2 heard, RRR. No JVD, murmurs, rubs, gallops or clicks. No pedal edema. Gastrointestinal system: Abdomen is nondistended, soft and nontender. No organomegaly or masses felt. Normal bowel sounds heard.  NG  tube with bilious output noted. Central nervous system: Alert and oriented. No focal neurological deficits. Extremities: Symmetric 5 x 5 power. Skin: No rashes, lesions or ulcers Psychiatry: Judgement and insight appear normal. Mood & affect appropriate.     Data Reviewed: I have personally reviewed following labs and imaging studies  CBC: Recent Labs  Lab 01/30/19 1920 01/30/19 2241 01/31/19 0302 02/01/19 0848 02/02/19 0226  WBC 6.7 11.3*  --  14.0* 10.9*  NEUTROABS 4.2  --   --   --   --   HGB 11.7* 11.6* 12.5* 13.4 13.6  HCT 35.9* 35.9* 38.5* 40.3 40.8  MCV 102.6* 100.3*  --  98.5 98.3  PLT 176 142*  --  168 190   Basic Metabolic Panel: Recent Labs  Lab 01/30/19 1920 01/31/19 0302 01/31/19 0815 01/31/19 1427 01/31/19 2039 02/01/19 0848 02/02/19 0226  NA 134* 135 136  --   --  134* 137  K 6.1* 6.6* 5.1 4.8 5.0 4.8 4.5  CL 101 105 105  --   --  102 102  CO2 21* 19* 23  --   --  25 24  GLUCOSE 120* 147* 86  --   --  159* 134*  BUN 64* 59* 47*  --   --  20 17  CREATININE 7.76* 6.41* 4.52*  --   --  1.48* 1.11  CALCIUM 9.4 8.0* 8.2*  --   --  9.0 8.8*   GFR: Estimated Creatinine Clearance: 79.5 mL/min (by C-G formula based on SCr of 1.11 mg/dL). Liver Function Tests: Recent Labs  Lab 01/30/19 1920  AST 36  ALT 30  ALKPHOS 65  BILITOT 1.0  PROT 6.5  ALBUMIN 4.2   No results for input(s): LIPASE, AMYLASE in the last 168 hours. No results for input(s): AMMONIA in the last 168 hours. Coagulation Profile: Recent Labs  Lab 01/30/19 1920  INR 1.0   Cardiac Enzymes: Recent Labs  Lab 01/30/19 1920  TROPONINI <0.03   BNP (last 3 results) No results for input(s): PROBNP in the last 8760 hours. HbA1C: No results for input(s): HGBA1C in the last 72 hours. CBG: Recent Labs  Lab 01/31/19 0510 01/31/19 2258 01/31/19 2315 01/31/19 2337  GLUCAP 131* 60* 60* 126*   Lipid Profile: No results for input(s): CHOL, HDL, LDLCALC, TRIG, CHOLHDL, LDLDIRECT in  the last 72 hours. Thyroid Function Tests: No results for input(s): TSH, T4TOTAL, FREET4, T3FREE, THYROIDAB in the last 72 hours. Anemia Panel: No results for input(s): VITAMINB12, FOLATE, FERRITIN, TIBC, IRON, RETICCTPCT in the last 72 hours. Sepsis Labs: No results for input(s): PROCALCITON, LATICACIDVEN in the last 168 hours.  Recent Results (from the past 240 hour(s))  MRSA PCR Screening     Status: None   Collection Time: 01/31/19  2:40 AM  Result Value Ref Range Status   MRSA by PCR NEGATIVE NEGATIVE Final    Comment:        The GeneXpert MRSA Assay (FDA approved for NASAL specimens only), is one component of a comprehensive MRSA colonization surveillance program.  It is not intended to diagnose MRSA infection nor to guide or monitor treatment for MRSA infections. Performed at [facility], [address], [place], [place] [address]          Radiology Studies: Dg Abd 1 View  Result Date: 02/01/2019 CLINICAL DATA:  Enteric tube placement EXAM: ABDOMEN - 1 VIEW COMPARISON:  None. FINDINGS: The tip of the enteric tube is at the expected location of the gastroduodenal junction or first portion of the duodenum. The side port is still likely within the stomach. There are multiple dilated loops of bowel throughout the abdomen. IMPRESSION: Enteric tube tip near the gastroduodenal junction. Electronically Signed   By: Deatra Robinson M.D.   On: 02/01/2019 14:09   Dg Abd Portable 1v  Result Date: 02/01/2019 CLINICAL DATA:  Abdominal distension. EXAM: PORTABLE ABDOMEN - 1 VIEW COMPARISON:  CT scan January 30, 2019 FINDINGS: Lung bases are normal. No free air, portal venous gas, or pneumatosis on the supine view. Dilated loops of small bowel are identified. There is also air seen within the ascending and transverse colon which are mildly prominent. The dilated small bowel loops measure up to 5.2 cm. The transverse colon measures 6.6 cm. No other acute abnormalities. IMPRESSION: 1.  Dilated loops of small bowel and mildly prominent loops of colon. The small bowel appears to be dilated out of proportion to the colon suggesting early or partial small bowel obstruction. Given the amount of air in the colon, a developing ileus is also a possibility. Recommend clinical correlation. A CT scan could further evaluate if clinically warranted. Otherwise, recommend attention on close follow-up. These results will be called to the ordering clinician or representative by the Radiologist Assistant, and communication documented in the PACS or zVision Dashboard. Electronically Signed   By: Gerome Sam III M.D   On: 02/01/2019 08:18        Scheduled Meds: . finasteride  5 mg Oral Daily  . heparin injection (subcutaneous)  5,000 Units Subcutaneous Q8H   Continuous Infusions: . cefTRIAXone (ROCEPHIN)  IV 2 g (02/02/19 0200)  . dextrose 5 % and 0.9% NaCl 100 mL/hr at 02/02/19 0800  . lactated ringers       LOS: 3 days    Time spent: 30 minutes    Xavius Spadafore Hoover Brunette, DO [facility] Pager [phone]  If 7PM-7AM, please contact night-coverage [email or web address] Password [facility] 02/02/2019, 9:14 AM

## 2019-02-02 NOTE — Progress Notes (Signed)
Patient arrived to the unit from [place]  alert and oriented X 4 Denies pain Patient has a NG tube in place, Foley in place. lower right surgical incision. Honey comb dressing clean dry and intact. There is also a JP drain place in the surgical incision. Placed on Tele box 30 All questions and concern addressed. Bed in the lowest position with bed alarm set. Call light in reach. Nurse will continue to monitor.

## 2019-02-03 DIAGNOSIS — K661 Hemoperitoneum: Secondary | ICD-10-CM

## 2019-02-03 LAB — BASIC METABOLIC PANEL
Anion gap: 11 (ref 5–15)
BUN: 17 mg/dL (ref 6–20)
CO2: 24 mmol/L (ref 22–32)
Calcium: 8.6 mg/dL — ABNORMAL LOW (ref 8.9–10.3)
Chloride: 100 mmol/L (ref 98–111)
Creatinine, Ser: 1 mg/dL (ref 0.61–1.24)
GFR calc Af Amer: >60 mL/min (ref >60)
GFR calc non Af Amer: >60 mL/min (ref >60)
Glucose, Bld: 116 mg/dL — ABNORMAL HIGH (ref 70–99)
Potassium: 4.2 mmol/L (ref 3.5–5.1)
Sodium: 135 mmol/L (ref 135–145)

## 2019-02-03 LAB — CBC
HCT: 39.7 % (ref 39.0–52.0)
Hemoglobin: 12.8 g/dL — ABNORMAL LOW (ref 13.0–17.0)
MCH: 31.8 pg (ref 26.0–34.0)
MCHC: 32.2 g/dL (ref 30.0–36.0)
MCV: 98.8 fL (ref 80.0–100.0)
Platelets: 184 10*3/uL (ref 150–400)
RBC: 4.02 MIL/uL — ABNORMAL LOW (ref 4.22–5.81)
RDW: 13.9 % (ref 11.5–15.5)
WBC: 8.8 10*3/uL (ref 4.0–10.5)
nRBC: 0 % (ref 0.0–0.2)

## 2019-02-03 MED ORDER — THIAMINE HCL 100 MG/ML IJ SOLN
100.0000 mg | Freq: Every day | INTRAMUSCULAR | Status: AC
  Administered 2019-02-03 – 2019-02-07 (×5): 100 mg via INTRAVENOUS
  Filled 2019-02-03 (×5): qty 2

## 2019-02-03 MED ORDER — METOPROLOL SUCCINATE ER 25 MG PO TB24
50.0000 mg | ORAL_TABLET | Freq: Every day | ORAL | Status: DC
  Administered 2019-02-03: 50 mg via ORAL
  Filled 2019-02-03: qty 2

## 2019-02-03 MED ORDER — FLUTICASONE FUROATE-VILANTEROL 100-25 MCG/INH IN AEPB
1.0000 | INHALATION_SPRAY | Freq: Every day | RESPIRATORY_TRACT | Status: DC
  Administered 2019-02-04 – 2019-02-13 (×10): 1 via RESPIRATORY_TRACT
  Filled 2019-02-03 (×2): qty 28

## 2019-02-03 MED ORDER — FOLIC ACID 5 MG/ML IJ SOLN
1.0000 mg | Freq: Every day | INTRAMUSCULAR | Status: AC
  Administered 2019-02-03 – 2019-02-07 (×5): 1 mg via INTRAVENOUS
  Filled 2019-02-03 (×7): qty 0.2

## 2019-02-03 MED ORDER — UMECLIDINIUM BROMIDE 62.5 MCG/INH IN AEPB
1.0000 | INHALATION_SPRAY | Freq: Every day | RESPIRATORY_TRACT | Status: DC
  Administered 2019-02-04 – 2019-02-13 (×10): 1 via RESPIRATORY_TRACT
  Filled 2019-02-03 (×2): qty 7

## 2019-02-03 MED ORDER — FLUTICASONE-UMECLIDIN-VILANT 100-62.5-25 MCG/INH IN AEPB: 1.0000 | INHALATION_SPRAY | Freq: Every day | RESPIRATORY_TRACT | Status: DC

## 2019-02-03 MED ORDER — METOPROLOL TARTRATE 5 MG/5ML IV SOLN
5.0000 mg | Freq: Three times a day (TID) | INTRAVENOUS | Status: DC
  Administered 2019-02-03 – 2019-02-04 (×3): 5 mg via INTRAVENOUS
  Filled 2019-02-03 (×3): qty 5

## 2019-02-03 NOTE — Progress Notes (Signed)
[facility] PROGRESS NOTE  Caleb Potter  BMS:[id number] DOB: May 30, 1961 DOA: 01/30/2019 PCP: [name], MD  Brief Narrative: Caleb Potter is a 58 y.o. male with a history of HTN, CAD, and nephrolithiasis who presented to the [facility] ED with abdominal pain found to have intraperitoneal fluid, suspected hemorrhage on CT abd/pelvis with subsequent CT pyelogram demonstrating ruptured bladder and hyperkalemic AKI with creatinine 7.7, K 6.1. Urology performed ex lap with bladder repair 4/13. IV fluids, lokelma, bicarbonate, and calcium gluconate were provided as well. Postoperatively renal function and electrolytes have normalized, though he has an ileus vs. partial SBO with NG tube placed and continues to have significant output.  Subjective: Overall feels better today. Reports he usually drinks 6-12 low gravity beers every day after work but denies any symptoms of withdrawal or history of DTs. Denies chest pain, dyspnea, palpitations. Had loose BM yesterday, none overnight and no flatus today. Denies nausea or vomiting, and always has abdominal protuberance though was worse yesterday.  Objective: BP (!) 127/98 (BP Location: Right Arm)   Pulse (!) 124   Temp 98.7 F (37.1 C) (Oral)   Resp 18   Ht 5\' 6"  (1.676 m)   Wt 100.2 kg   SpO2 92%   BMI 35.65 kg/m   Gen: Nontoxic obese male in no distress HEENT: NG tube in left nare. Pulm: Clear and nonlabored on room air  CV: RRR, no murmur, no JVD, no edema GI: Protuberant with hypoactive bowel sounds, significant dark NG output. No tenderness. Neuro: Alert and oriented. No focal deficits. No asterixis. Ext: Warm, no deformities Skin: Vertical midline incision with honeycomb dressing with scant lower incisional sanguinous discharge, no erythema or purulence. JP drain with sanguinous output. GU: Normal penis with foley draining clear yellow.  Assessment & Plan: Ileus vs. pSBO:  - Continue NGT since significant output (not yet  documented), though subjectively and objectively improving.  - Follow output, exam, and abd XR in AM to inform timing of clamping trial.   AKI: Due to BOO and uremic labs due to reabsorption of uroperitoneum. Resolved.  - Continue IVF while NPO - Consider adding back ACEi pending BP trends. - Avoid nephrotoxins  Alcohol abuse: Denies history of dependence or withdrawal. Last drink was at least 4 days ago, so he is outside window typical for withdrawal.  - Monitor off CIWA for now - Add thiamine, folate  Sinus tachycardia: Due to holding home BB mostly likely. - Restart metoprolol, give IV for now  COPD:  - Restart home inhaler  CAD: No angina.  - Restart BB as above, restart statin and ASA once taking po.   Bladder rupture: s/p ex lap, repair 4/13.  - Per urology. - Abx per urology, currently on CTX. WBC normalized.   Near syncope with hypotension: No true syncope, attributable to hypovolemia. Resolved.   Acute blood loss anemia: Mild, no ongoing bleeding noted and vitals improving.  - Continue to monitor, ok to continue heparin [place].  Tyrone Nine, MD [facility] [email or web address] Password [facility] 02/03/2019, 10:43 AM

## 2019-02-03 NOTE — TOC Initial Note (Signed)
Transition of Care [facility](TOC) - Initial/Assessment Note    Patient Details  Name: Caleb Potter MRN: [id number] Date of Birth: 07/10/1961  Transition of Care [facility](TOC) CM/SW Contact:    Caleb BaloKelli F Ron Junco, RN Phone Number: 02/03/2019, 1:47 PM  Clinical Narrative:                   Expected Discharge Plan: Home/Self Care Barriers to Discharge: Continued Medical Work up   Patient Goals and CMS Choice Patient states their goals for this hospitalization and ongoing recovery are:: to get home      Expected Discharge Plan and Services Expected Discharge Plan: Home/Self Care       Living arrangements for the past 2 months: Single Family Home(2 levels but he stays on ground level)                          Prior Living Arrangements/Services Living arrangements for the past 2 months: Single Family Home(2 levels but he stays on ground level) Lives with:: Spouse Patient language and need for interpreter reviewed:: Yes(no needs) Do you feel safe going back to the place where you live?: Yes      Need for Family Participation in Patient Care: Yes (Comment) Care giver support system in place?: Yes (comment)(wife and daughter) Current home services: DME(states he has all he would need) Criminal Activity/Legal Involvement Pertinent to Current Situation/Hospitalization: No - Comment as needed  Activities of Daily Living Home Assistive Devices/Equipment: None ADL Screening (condition at time of admission) Patient's cognitive ability adequate to safely complete daily activities?: Yes Is the patient deaf or have difficulty hearing?: No Does the patient have difficulty seeing, even when wearing glasses/contacts?: No Does the patient have difficulty concentrating, remembering, or making decisions?: No Patient able to express need for assistance with ADLs?: Yes Does the patient have difficulty dressing or bathing?: No Independently performs ADLs?: No Communication: Independent Dressing (OT):  Needs assistance Is this a change from baseline?: Change from baseline, expected to last <3days Grooming: Needs assistance Is this a change from baseline?: Change from baseline, expected to last <3 days Feeding: Independent Bathing: Needs assistance Is this a change from baseline?: Change from baseline, expected to last <3 days Toileting: Needs assistance Is this a change from baseline?: Change from baseline, expected to last <3 days In/Out Bed: Needs assistance Is this a change from baseline?: Change from baseline, expected to last <3 days Walks in Home: Needs assistance Is this a change from baseline?: Change from baseline, expected to last <3 days Does the patient have difficulty walking or climbing stairs?: No Weakness of Legs: None Weakness of Arms/Hands: None  Permission Sought/Granted Permission sought to share information with : Family Supports Permission granted to share information with : Yes, Verbal Permission Granted  Share Information with NAME: Boyd Kerbsenny or Marcelino DusterMichelle     Permission granted to share info w Relationship: wife or daughter  Permission granted to share info w Contact Information: [phone] or [phone]  Emotional Assessment Appearance:: Appears stated age Attitude/Demeanor/Rapport: Engaged Affect (typically observed): Accepting, Appropriate, Pleasant Orientation: : Oriented to Self, Oriented to Place, Oriented to  Time, Oriented to Situation   Psych Involvement: No (comment)  Admission diagnosis:  Bladder rupture [N32.89] Intraperitoneal hemorrhage [K66.1] Acute renal failure, unspecified acute renal failure type (HCC) [N17.9] Intraperitoneal rupture of bladder [N32.89] Patient Active Problem List   Diagnosis Date Noted  . 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 Hypertension 01/31/2019  . Coronary artery  disease 01/31/2019  . AKI (acute kidney injury) (HCC) 01/31/2019  . Hyperkalemia 01/31/2019  . Anxiety 01/31/2019  . Intraperitoneal  hematoma 01/30/2019   PCP:  [name], MD Pharmacy:   [facility] [address], [place] - [address] [place] [address] [place] [address] [place] [address] Phone: [phone] Fax: [phone]  [facility] [phone] - [place], [place] - [address] AT [facility] [address] [place] [address] Phone: [phone] Fax: [phone]     Social Determinants of Health (SDOH) Interventions  No issues with home meds.  Pt states family can provide transportation.  Readmission Risk Interventions No flowsheet data found.

## 2019-02-03 NOTE — Progress Notes (Signed)
4 Days Post-Op  Subjective: Caleb Potter is 4 days out from bladder rupture repair for bladder rupture to gross hematuria with BPH and BOO and probable beer drinkers bladder.  He reports 7-12 Bud lights daily after work.   He has no nausea and is tolerating the foley and NG well.   Pain well controlled.  ROS:  Review of Systems  All other systems reviewed and are negative.   Anti-infectives: Anti-infectives (From admission, onward)   Start     Dose/Rate Route Frequency Ordered Stop   02/01/19 0100  cefTRIAXone (ROCEPHIN) 2 g in sodium chloride 0.9 % 100 mL IVPB  Status:  Discontinued     2 g 200 mL/hr over 30 Minutes Intravenous Every 24 hours 01/31/19 0223 02/03/19 0855   01/31/19 0030  cefTRIAXone (ROCEPHIN) 2 g in sodium chloride 0.9 % 100 mL IVPB     2 g 200 mL/hr over 30 Minutes Intravenous  Once 01/31/19 0015 01/31/19 0240      Current Facility-Administered Medications  Medication Dose Route Frequency Provider Last Rate Last Dose  . acetaminophen (TYLENOL) tablet 650 mg  650 mg Oral Q6H PRN Opyd, Lavone Neri, MD       Or  . acetaminophen (TYLENOL) suppository 650 mg  650 mg Rectal Q6H PRN Opyd, Lavone Neri, MD      . bisacodyl (DULCOLAX) suppository 10 mg  10 mg Rectal Daily PRN Bjorn Pippin, MD   10 mg at 02/02/19 0747  . dextrose 5 %-0.9 % sodium chloride infusion   Intravenous Continuous Regalado, Belkys A, MD 100 mL/hr at 02/03/19 0034    . diphenhydrAMINE (BENADRYL) injection 12.5-25 mg  12.5-25 mg Intravenous Q6H PRN Bjorn Pippin, MD       Or  . diphenhydrAMINE (BENADRYL) 12.5 MG/5ML elixir 12.5-25 mg  12.5-25 mg Oral Q6H PRN Bjorn Pippin, MD      . finasteride (PROSCAR) tablet 5 mg  5 mg Oral Daily Bjorn Pippin, MD   5 mg at 01/31/19 1053  . heparin injection 5,000 Units  5,000 Units Subcutaneous Q8H Bjorn Pippin, MD   5,000 Units at 02/03/19 0549  . HYDROcodone-acetaminophen (NORCO/VICODIN) 5-325 MG per tablet 1-2 tablet  1-2 tablet Oral Q4H PRN Opyd, Lavone Neri, MD      .  HYDROmorphone (DILAUDID) injection 0.5-1 mg  0.5-1 mg Intravenous Q2H PRN Bjorn Pippin, MD   1 mg at 02/02/19 2104  . lactated ringers infusion   Intravenous Continuous Maurilio Lovely D, DO 100 mL/hr at 02/03/19 0309    . metoprolol succinate (TOPROL-XL) 24 hr tablet 50 mg  50 mg Oral Daily Hazeline Junker B, MD      . morphine 4 MG/ML injection 4 mg  4 mg Intravenous Q4H PRN Opyd, Lavone Neri, MD      . ondansetron (ZOFRAN) tablet 4 mg  4 mg Oral Q6H PRN Opyd, Lavone Neri, MD       Or  . ondansetron (ZOFRAN) injection 4 mg  4 mg Intravenous Q6H PRN Opyd, Lavone Neri, MD   4 mg at 02/01/19 1237  . oxybutynin (DITROPAN) tablet 5 mg  5 mg Oral Q8H PRN Bjorn Pippin, MD      . oxyCODONE (Oxy IR/ROXICODONE) immediate release tablet 5 mg  5 mg Oral Q4H PRN Bjorn Pippin, MD      . promethazine (PHENERGAN) injection 12.5 mg  12.5 mg Intravenous Q6H PRN Bjorn Pippin, MD   12.5 mg at 02/02/19 0206  . senna-docusate (Senokot-S) tablet 1 tablet  1 tablet  Oral QHS PRN Bjorn PippinWrenn, Anhthu Perdew, MD      . sodium phosphate (FLEET) 7-19 GM/118ML enema 1 enema  1 enema Rectal Once PRN Bjorn PippinWrenn, Rileigh Kawashima, MD      . zolpidem (AMBIEN) tablet 5 mg  5 mg Oral QHS PRN,MR X 1 Bjorn PippinWrenn, Reza Crymes, MD         Objective: Vital signs in last 24 hours: Temp:  [98 F (36.7 C)-98.7 F (37.1 C)] 98.7 F (37.1 C) (04/16 0748) Pulse Rate:  [50-125] 124 (04/16 0748) Resp:  [14-30] 18 (04/16 0748) BP: (107-166)/(75-98) 127/98 (04/16 0748) SpO2:  [92 %-99 %] 92 % (04/16 0748)  Intake/Output from previous day: 04/15 0701 - 04/16 0700 In: 998.8 [P.O.:200; I.V.:798.8] Out: 2021 [Urine:1150; Emesis/NG output:450; Drains:420; Stool:1] Intake/Output this shift: No intake/output data recorded.   Physical Exam Constitutional:      Appearance: Normal appearance. He is obese.  Cardiovascular:     Rate and Rhythm: Regular rhythm. Tachycardia present.     Heart sounds: Normal heart sounds.  Pulmonary:     Effort: Pulmonary effort is normal. No respiratory distress.      Breath sounds: Normal breath sounds.  Abdominal:     General: There is distension (but improved with increased BS. ).     Comments: Wound intact but dressing change needed.   Genitourinary:    Comments: Foley draining clear urine.  Musculoskeletal: Normal range of motion.        General: No swelling or tenderness.  Skin:    General: Skin is warm and dry.  Neurological:     General: No focal deficit present.     Mental Status: He is alert and oriented to person, place, and time.  Psychiatric:        Mood and Affect: Mood normal.        Behavior: Behavior normal.     Lab Results:  Recent Labs    02/02/19 0226 02/03/19 0459  WBC 10.9* 8.8  HGB 13.6 12.8*  HCT 40.8 39.7  PLT 190 184   BMET Recent Labs    02/02/19 0226 02/03/19 0459  NA 137 135  K 4.5 4.2  CL 102 100  CO2 24 24  GLUCOSE 134* 116*  BUN 17 17  CREATININE 1.11 1.00  CALCIUM 8.8* 8.6*   PT/INR No results for input(s): LABPROT, INR in the last 72 hours. ABG No results for input(s): PHART, HCO3 in the last 72 hours.  Invalid input(s): PCO2, PO2  Studies/Results: Dg Abd 1 View  Result Date: 02/01/2019 CLINICAL DATA:  Enteric tube placement EXAM: ABDOMEN - 1 VIEW COMPARISON:  None. FINDINGS: The tip of the enteric tube is at the expected location of the gastroduodenal junction or first portion of the duodenum. The side port is still likely within the stomach. There are multiple dilated loops of bowel throughout the abdomen. IMPRESSION: Enteric tube tip near the gastroduodenal junction. Electronically Signed   By: Deatra RobinsonKevin  Herman M.D.   On: 02/01/2019 14:09     Assessment and Plan:  Clot retention with bladder rupture s/p repair.   Wound intact.  Urine clear.   AKI from peritoneal urine absorption resolved.  Post op ileus with small possibility of SBO now with NG.  Some improvement in BS but has high NG output.  Continue NPO and NG drainage pending resolution of ileus.   Abd film in AM and  consider tube clamping.   Tachycardia.   Medicine aware and will adjust meds.    LOS: 4 days  Bjorn Pippin 02/03/2019 [phone] ID: Caleb Potter, male   DOB: 04/14/1961, 58 y.o.   MRN: [id number]

## 2019-02-04 ENCOUNTER — Inpatient Hospital Stay (HOSPITAL_COMMUNITY): Payer: BLUE CROSS/BLUE SHIELD

## 2019-02-04 MED ORDER — METOPROLOL TARTRATE 5 MG/5ML IV SOLN
5.0000 mg | Freq: Four times a day (QID) | INTRAVENOUS | Status: DC
  Administered 2019-02-04 – 2019-02-11 (×27): 5 mg via INTRAVENOUS
  Filled 2019-02-04 (×28): qty 5

## 2019-02-04 MED ORDER — DIAZEPAM 5 MG PO TABS
10.0000 mg | ORAL_TABLET | Freq: Once | ORAL | Status: AC
  Administered 2019-02-04: 10 mg via ORAL
  Filled 2019-02-04: qty 2

## 2019-02-04 NOTE — Progress Notes (Addendum)
5 Days Post-Op  Subjective: Caleb Potter is without complaints and reports passage of flatus.  He has no nausea but has a sore throat from the tube.  An abd film this morning still shows markedly dilated loops of small bowel.   ROS:  Review of Systems  All other systems reviewed and are negative.   Anti-infectives: Anti-infectives (From admission, onward)   Start     Dose/Rate Route Frequency Ordered Stop   02/01/19 0100  cefTRIAXone (ROCEPHIN) 2 g in sodium chloride 0.9 % 100 mL IVPB  Status:  Discontinued     2 g 200 mL/hr over 30 Minutes Intravenous Every 24 hours 01/31/19 0223 02/03/19 0855   01/31/19 0030  cefTRIAXone (ROCEPHIN) 2 g in sodium chloride 0.9 % 100 mL IVPB     2 g 200 mL/hr over 30 Minutes Intravenous  Once 01/31/19 0015 01/31/19 0240      Current Facility-Administered Medications  Medication Dose Route Frequency Provider Last Rate Last Dose  . acetaminophen (TYLENOL) tablet 650 mg  650 mg Oral Q6H PRN Opyd, Lavone Neri, MD       Or  . acetaminophen (TYLENOL) suppository 650 mg  650 mg Rectal Q6H PRN Opyd, Lavone Neri, MD      . bisacodyl (DULCOLAX) suppository 10 mg  10 mg Rectal Daily PRN Bjorn Pippin, MD   10 mg at 02/02/19 0747  . dextrose 5 %-0.9 % sodium chloride infusion   Intravenous Continuous Regalado, Belkys A, MD 100 mL/hr at 02/03/19 2316    . diphenhydrAMINE (BENADRYL) injection 12.5-25 mg  12.5-25 mg Intravenous Q6H PRN Bjorn Pippin, MD       Or  . diphenhydrAMINE (BENADRYL) 12.5 MG/5ML elixir 12.5-25 mg  12.5-25 mg Oral Q6H PRN Bjorn Pippin, MD      . finasteride (PROSCAR) tablet 5 mg  5 mg Oral Daily Bjorn Pippin, MD   5 mg at 02/03/19 1000  . fluticasone furoate-vilanterol (BREO ELLIPTA) 100-25 MCG/INH 1 puff  1 puff Inhalation Daily Bjorn Pippin, MD      . folic acid injection 1 mg  1 mg Intravenous Daily Hazeline Junker B, MD   1 mg at 02/03/19 1806  . heparin injection 5,000 Units  5,000 Units Subcutaneous Q8H Bjorn Pippin, MD   5,000 Units at 02/04/19 [phone]  .  HYDROcodone-acetaminophen (NORCO/VICODIN) 5-325 MG per tablet 1-2 tablet  1-2 tablet Oral Q4H PRN Opyd, Lavone Neri, MD      . HYDROmorphone (DILAUDID) injection 0.5-1 mg  0.5-1 mg Intravenous Q2H PRN Bjorn Pippin, MD   1 mg at 02/02/19 2104  . lactated ringers infusion   Intravenous Continuous Maurilio Lovely D, DO 100 mL/hr at 02/03/19 0309    . metoprolol tartrate (LOPRESSOR) injection 5 mg  5 mg Intravenous Q8H Tyrone Nine, MD   5 mg at 02/04/19 0640  . morphine 4 MG/ML injection 4 mg  4 mg Intravenous Q4H PRN Opyd, Lavone Neri, MD      . ondansetron (ZOFRAN) tablet 4 mg  4 mg Oral Q6H PRN Opyd, Lavone Neri, MD       Or  . ondansetron (ZOFRAN) injection 4 mg  4 mg Intravenous Q6H PRN Opyd, Lavone Neri, MD   4 mg at 02/01/19 1237  . oxybutynin (DITROPAN) tablet 5 mg  5 mg Oral Q8H PRN Bjorn Pippin, MD      . oxyCODONE (Oxy IR/ROXICODONE) immediate release tablet 5 mg  5 mg Oral Q4H PRN Bjorn Pippin, MD      .  promethazine (PHENERGAN) injection 12.5 mg  12.5 mg Intravenous Q6H PRN Bjorn Pippin, MD   12.5 mg at 02/02/19 0206  . senna-docusate (Senokot-S) tablet 1 tablet  1 tablet Oral QHS PRN Bjorn Pippin, MD      . sodium phosphate (FLEET) 7-19 GM/118ML enema 1 enema  1 enema Rectal Once PRN Bjorn Pippin, MD      . thiamine (B-1) injection 100 mg  100 mg Intravenous Daily Hazeline Junker B, MD   100 mg at 02/03/19 1806  . umeclidinium bromide (INCRUSE ELLIPTA) 62.5 MCG/INH 1 puff  1 puff Inhalation Daily Bjorn Pippin, MD      . zolpidem (AMBIEN) tablet 5 mg  5 mg Oral QHS PRN,MR X 1 Bjorn Pippin, MD         Objective: Vital signs in last 24 hours: Temp:  [97.7 F (36.5 C)-99.1 F (37.3 C)] 98.3 F (36.8 C) (04/17 0752) Pulse Rate:  [99-135] 99 (04/17 0752) Resp:  [18-22] 18 (04/17 0752) BP: (107-136)/(76-95) 121/88 (04/17 0752) SpO2:  [93 %-96 %] 96 % (04/17 0752)  Intake/Output from previous day: 04/16 0701 - 04/17 0700 In: 3757.5 [I.V.:3757.5] Out: 3340 [Urine:1250; Emesis/NG output:2000;  Drains:90] Intake/Output this shift: No intake/output data recorded.   Physical Exam Constitutional:      Appearance: Normal appearance. He is obese.  Cardiovascular:     Rate and Rhythm: Regular rhythm. Tachycardia present.     Heart sounds: Normal heart sounds.  Pulmonary:     Effort: Pulmonary effort is normal. No respiratory distress.  Abdominal:     General: There is distension.     Comments: High pitched BS present.  Wound is intact.  JP drain output is low.  Musculoskeletal: Normal range of motion.        General: No swelling or tenderness.  Skin:    General: Skin is warm and dry.  Neurological:     General: No focal deficit present.     Mental Status: He is alert and oriented to person, place, and time.     Lab Results:  Recent Labs    02/02/19 0226 02/03/19 0459  WBC 10.9* 8.8  HGB 13.6 12.8*  HCT 40.8 39.7  PLT 190 184   BMET Recent Labs    02/02/19 0226 02/03/19 0459  NA 137 135  K 4.5 4.2  CL 102 100  CO2 24 24  GLUCOSE 134* 116*  BUN 17 17  CREATININE 1.11 1.00  CALCIUM 8.8* 8.6*   PT/INR No results for input(s): LABPROT, INR in the last 72 hours. ABG No results for input(s): PHART, HCO3 in the last 72 hours.  Invalid input(s): PCO2, PO2  Studies/Results: No results found.   Assessment and Plan: Clot retention with bladder rupture s/p repair. Wound intact. Urine clear.  JP drainage down to 65ml yesterday from the day before.  Will check Cr and remove if serum level.   AKI from peritoneal urine absorption resolved.  Post op ileus with small possibility of SBO now with NG.  Some improvement in BS and increased flatus.  NG output has declined.  Abd films still shows dilated loops of bowel. I will get a CT with oral/NG contrast to better assess his bowel status. Continue NPO and NG drainage for now.   Tachycardia.   Medicine aware and will adjust meds.      LOS: 5 days    Bjorn Pippin 02/04/2019 [phone] ID:  Caleb Potter, male   DOB: [birth year], 58 y.o.  MRN: [id number]

## 2019-02-04 NOTE — Progress Notes (Signed)
Initial Nutrition Assessment   RD working remotely.  DOCUMENTATION CODES:   Obesity unspecified  INTERVENTION:  When appropriate, diet advancement per MD team.  RD to continue to monitor.   NUTRITION DIAGNOSIS:   Inadequate oral intake related to inability to eat as evidenced by NPO status.  GOAL:   Patient will meet greater than or equal to 90% of their needs  MONITOR:   Labs, Skin, Diet advancement, Weight trends, I & O's  REASON FOR ASSESSMENT:   NPO/Clear Liquid Diet    ASSESSMENT:   58 y.o. male with a history of HTN, CAD, and nephrolithiasis who presented to the [facility] ED with abdominal pain found to have intraperitoneal fluid, suspected hemorrhage on CT abd/pelvis with subsequent CT pyelogram demonstrating ruptured bladder. Urology performed ex lap with bladder repair 4/13.  Post op pt with  ileus vs. partial SBO  Per MD, abdominal XR 4/17 shows persistent dilated loops of small bowel. NGT in place to suction. NGT output 300 ml. RD to order nutritional supplements as appropriate once diet advances. Unable to complete Nutrition-Focused physical exam at this time. Labs and medications reviewed.   Diet Order:   Diet Order            Diet NPO time specified Except for: Ice Chips  Diet effective now              EDUCATION NEEDS:   Not appropriate for education at this time  Skin:  Skin Assessment: Skin Integrity Issues: Skin Integrity Issues:: Incisions Incisions: abdomen  Last BM:  4/15  Height:   Ht Readings from Last 1 Encounters:  02/04/19 5\' 6"  (1.676 m)    Weight:   Wt Readings from Last 1 Encounters:  02/04/19 100.2 kg    Ideal Body Weight:  64.5 kg  BMI:  Body mass index is 35.65 kg/m.  Estimated Nutritional Needs:   Kcal:  1900-2100  Protein:  100-115 grams  Fluid:  1.9 - 2.1 L/day    Roslyn Smiling, MS, RD, LDN Pager # [phone] After hours/ weekend pager # [phone]

## 2019-02-04 NOTE — Progress Notes (Signed)
Spoke to Dr. Jacquelyne Balint regarding red colored urine.  Normal finding if patient otherwise asymptomatic. No new orders.

## 2019-02-04 NOTE — Progress Notes (Signed)
[facility] PROGRESS NOTE  Caleb Potter  GLO:[id number] DOB: 09-Feb-1961 DOA: 01/30/2019 PCP: [name], MD  Brief Narrative: Caleb Potter is a 58 y.o. male with a history of HTN, CAD, and nephrolithiasis who presented to the [facility] ED with abdominal pain found to have intraperitoneal fluid, suspected hemorrhage on CT abd/pelvis with subsequent CT pyelogram demonstrating ruptured bladder and hyperkalemic AKI with creatinine 7.7, K 6.1. Urology performed ex lap with bladder repair 4/13. IV fluids, lokelma, bicarbonate, and calcium gluconate were provided as well. Postoperatively renal function and electrolytes have normalized, though he has an ileus vs. partial SBO with NG tube placed. Abdominal XR 4/17 shows persistent dilated loops of small bowel. CT with contrast is ordered.  Subjective: Patient is eager to leave the hospital, as he feels better. He denies abdominal pain, says his abdomen always looks like this. Tired of having NGT. Had flatus but no BM / 24hrs.  Objective: BP 121/88 (BP Location: Left Arm)   Pulse 99   Temp 98.3 F (36.8 C) (Oral)   Resp 18   Ht 5\' 6"  (1.676 m)   Wt 100.2 kg   SpO2 96%   BMI 35.65 kg/m   Gen: Nontoxic obese male in no distress HEENT: NG tube in left nare. Pulm: Clear and nonlabored on room air  CV: RRR, no murmur, no JVD, no edema GI: Protuberant with hypoactive bowel sounds, significant dark NG output. No tenderness. Neuro: Alert and oriented. No focal deficits. No asterixis. Ext: Warm, no deformities Skin: Vertical midline incision with honeycomb dressing with scant lower incisional sanguinous discharge, no erythema or purulence. JP drain with sanguinous output. GU: Normal penis with foley draining clear yellow.  Assessment & Plan: Ileus vs. pSBO:  - NG output declining, though radiographically continues to have dilatation >6cm. - Agree with CT w/po contrast.  - Follow output, exam, radiographs to inform timing of clamping trial.    AKI: Due to BOO and uremic labs due to reabsorption of uroperitoneum. Resolved.  - Continue IVF while NPO - Consider adding back ACEi pending BP trends. - Avoid nephrotoxins  Alcohol abuse: Denies history of dependence or withdrawal. Last drink was at least 4 days ago, so he is outside window typical for withdrawal.  - Monitor off CIWA for now - Add thiamine, folate  Sinus tachycardia: Due to holding home BB mostly likely. - Restarted metoprolol, gave IV, will increase dose.  COPD:  - Restart home inhaler  CAD: No angina.  - Restart BB as above, restart statin and ASA once taking po.   Bladder rupture: s/p ex lap, repair 4/13.  - Per urology. - Abx per urology, currently on CTX. WBC normalized on 4/16.  Near syncope with hypotension: No true syncope, attributable to hypovolemia. Resolved.   Acute blood loss anemia: Mild, no ongoing bleeding noted and vitals improving.  - Continue to monitor, ok to continue heparin [place].  Tyrone Nine, MD [facility] [email or web address] Password [facility] 02/04/2019, 11:09 AM

## 2019-02-05 ENCOUNTER — Inpatient Hospital Stay (HOSPITAL_COMMUNITY): Payer: BLUE CROSS/BLUE SHIELD

## 2019-02-05 LAB — CBC
HCT: 37.2 % — ABNORMAL LOW (ref 39.0–52.0)
Hemoglobin: 12.2 g/dL — ABNORMAL LOW (ref 13.0–17.0)
MCH: 31.9 pg (ref 26.0–34.0)
MCHC: 32.8 g/dL (ref 30.0–36.0)
MCV: 97.4 fL (ref 80.0–100.0)
Platelets: 222 10*3/uL (ref 150–400)
RBC: 3.82 MIL/uL — ABNORMAL LOW (ref 4.22–5.81)
RDW: 13.3 % (ref 11.5–15.5)
WBC: 8.1 10*3/uL (ref 4.0–10.5)
nRBC: 0 % (ref 0.0–0.2)

## 2019-02-05 LAB — BASIC METABOLIC PANEL
Anion gap: 10 (ref 5–15)
BUN: 14 mg/dL (ref 6–20)
CO2: 26 mmol/L (ref 22–32)
Calcium: 9.1 mg/dL (ref 8.9–10.3)
Chloride: 99 mmol/L (ref 98–111)
Creatinine, Ser: 0.96 mg/dL (ref 0.61–1.24)
GFR calc Af Amer: >60 mL/min (ref >60)
GFR calc non Af Amer: >60 mL/min (ref >60)
Glucose, Bld: 136 mg/dL — ABNORMAL HIGH (ref 70–99)
Potassium: 4.7 mmol/L (ref 3.5–5.1)
Sodium: 135 mmol/L (ref 135–145)

## 2019-02-05 MED ORDER — LORAZEPAM 2 MG/ML IJ SOLN
1.0000 mg | Freq: Four times a day (QID) | INTRAMUSCULAR | Status: DC | PRN
  Administered 2019-02-07 – 2019-02-09 (×3): 1 mg via INTRAVENOUS
  Filled 2019-02-05 (×3): qty 1

## 2019-02-05 NOTE — Progress Notes (Addendum)
6 Days Post-Op Subjective: Patient reports no pain.  Not passing a lot of gas.  He did sit in the chair yesterday. Nurse said LCWS wasn't working this AM and when she got it working again   Objective: Vital signs in last 24 hours: Temp:  [98.2 F (36.8 C)-98.9 F (37.2 C)] 98.8 F (37.1 C) (04/18 1127) Pulse Rate:  [96-125] 96 (04/18 1127) Resp:  [16-20] 18 (04/18 1127) BP: (106-125)/(67-81) 124/81 (04/18 1127) SpO2:  [95 %-99 %] 95 % (04/18 1127)  Intake/Output from previous day: 04/17 0701 - 04/18 0700 In: 2239.2 [P.O.:600; I.V.:964.2] Out: 1800 [Urine:750; Emesis/NG output:950; Drains:100] Intake/Output this shift: No intake/output data recorded.  Physical Exam:  No acute distress Looks well, oriented, watching TV NG tube in place, bilious drainage about 400 cc Abdomen distended but soft, no rebound or guarding.  Bowel sounds are present.  Incision is clean dry and intact.  Minimal in the left lower quadrant JP. Extremity-no calf pain or swelling  Lab Results: Recent Labs    02/03/19 0459  HGB 12.8*  HCT 39.7   CBC    Component Value Date/Time   WBC 8.1 02/05/2019 1206   RBC 3.82 (L) 02/05/2019 1206   HGB 12.2 (L) 02/05/2019 1206   HCT 37.2 (L) 02/05/2019 1206   PLT 222 02/05/2019 1206   MCV 97.4 02/05/2019 1206   MCH 31.9 02/05/2019 1206   MCHC 32.8 02/05/2019 1206   RDW 13.3 02/05/2019 1206   LYMPHSABS 1.4 01/30/2019 1920   MONOABS 0.9 01/30/2019 1920   EOSABS 0.1 01/30/2019 1920   BASOSABS 0.0 01/30/2019 1920    BMET Recent Labs    02/03/19 0459  NA 135  K 4.2  CL 100  CO2 24  GLUCOSE 116*  BUN 17  CREATININE 1.00  CALCIUM 8.6*   BMET    Component Value Date/Time   NA 135 02/05/2019 1206   K 4.7 02/05/2019 1206   CL 99 02/05/2019 1206   CO2 26 02/05/2019 1206   GLUCOSE 136 (H) 02/05/2019 1206   BUN 14 02/05/2019 1206   CREATININE 0.96 02/05/2019 1206   CALCIUM 9.1 02/05/2019 1206   GFRNONAA >60 02/05/2019 1206   GFRAA >60 02/05/2019  1206     No results for input(s): LABPT, INR in the last 72 hours. No results for input(s): LABURIN in the last 72 hours. Results for orders placed or performed during the hospital encounter of 01/30/19  MRSA PCR Screening     Status: None   Collection Time: 01/31/19  2:40 AM  Result Value Ref Range Status   MRSA by PCR NEGATIVE NEGATIVE Final    Comment:        The GeneXpert MRSA Assay (FDA approved for NASAL specimens only), is one component of a comprehensive MRSA colonization surveillance program. It is not intended to diagnose MRSA infection nor to guide or monitor treatment for MRSA infections. Performed at [facility], [address] [place], [place] [id number]     Studies/Results: Ct Abdomen Pelvis Wo Contrast  Result Date: 02/04/2019 CLINICAL DATA:  Bowel obstruction versus ileus. Bladder rupture on Sunday. Laparotomy 01/30/2019 with bladder repair. EXAM: CT ABDOMEN AND PELVIS WITHOUT CONTRAST TECHNIQUE: Multidetector CT imaging of the abdomen and pelvis was performed following the standard protocol without IV contrast. COMPARISON:  Plain films 02/04/2019.  Most recent CT 01/30/2019 FINDINGS: Lower chest: Right base subsegmental atelectasis. Normal heart size without pericardial or pleural effusion. Multivessel coronary artery atherosclerosis. Hepatobiliary: Mild hepatic steatosis. Normal  gallbladder, without biliary ductal dilatation. Pancreas: Normal, without mass or ductal dilatation. Spleen: Normal in size, without focal abnormality. Adrenals/Urinary Tract: Normal adrenal glands. 3 mm interpolar right renal collecting system calculus. No hydronephrosis. Foley catheter within the urinary bladder. Stomach/Bowel: Nasogastric tube terminates at the gastric body. The colon is normal in caliber. The terminal ileum is normal, including on image 60/3. Proximal and mid small bowel loops are dilated, including at up to 5.6 cm on image 50/3. The mid to distal ileum undergoes a  gradual transition to normal caliber, without focal abnormality. No findings to suggest ischemia. Vascular/Lymphatic: Aortic and branch vessel atherosclerosis. No abdominopelvic adenopathy. Reproductive: Mild prostatomegaly. Other: A surgical drain from a left pelvic approach terminates in the cul-de-sac. No residual abdominopelvic fluid. No free intraperitoneal air. Musculoskeletal: Moderate left-sided gynecomastia. Lumbosacral spondylosis. IMPRESSION: 1. Relatively diffuse small bowel dilatation, without focal transition point. Findings are most consistent with postoperative adynamic ileus. 2. Right nephrolithiasis. 3.  Aortic Atherosclerosis (ICD10-I70.0). 4. Hepatic steatosis. 5. Left gynecomastia. Electronically Signed   By: Jeronimo Greaves M.D.   On: 02/04/2019 14:41   Dg Abd Portable 1v  Result Date: 02/04/2019 CLINICAL DATA:  Ileus. EXAM: PORTABLE ABDOMEN - 1 VIEW COMPARISON:  One-view abdomen 02/01/2019 FINDINGS: Multiple dilated loops of small bowel are again noted. NG tube is in place. The stomach is decompressed. Overall bowel dilation is improved. IMPRESSION: 1. Improving dilation of small bowel. Electronically Signed   By: Marin Roberts M.D.   On: 02/04/2019 08:35   4/18 abd film -- Persistent gaseous distension of the small bowel and colon consistent with postoperative ileus.  Assessment/Plan: POD#5 Exploratory laparotomy with repair of bladder rupture. 22Fr foley. NG, JP.  --Minimal JP drainage, creatinine normal so doubt urine leak -Continued ileus- spoke with Dr. Derrell Lolling and he will take a look at patient, lites and calcium normal    LOS: 6 days   Jerilee Field 02/05/2019, 11:29 AM

## 2019-02-05 NOTE — Progress Notes (Signed)
6 Days Post-Op   Subjective/Chief Complaint: Was called to see patient by Dr. Mena GoesEskridge for patient's continued ileus. Patient currently postop day 6 from ex lap and bladder repair.  Patient states he has had continued abdominal distention.  NG tube is been placed however patient feels it has not been working correctly.  It appears the NG tube apparatus has been correctly fixed and has had approximately 2 L out of NG tube output.  Today's KUB shows signs consistent with ileus.  Patient remains n.p.o.   Objective: Vital signs in last 24 hours: Temp:  [98.7 F (37.1 C)-98.9 F (37.2 C)] 98.8 F (37.1 C) (04/18 1127) Pulse Rate:  [96-125] 96 (04/18 1127) Resp:  [16-19] 18 (04/18 1127) BP: (106-124)/(67-81) 124/81 (04/18 1127) SpO2:  [95 %-97 %] 95 % (04/18 1127) Last BM Date: 02/02/19  Intake/Output from previous day: 04/17 0701 - 04/18 0700 In: 2239.2 [P.O.:600; I.V.:964.2] Out: 1800 [Urine:750; Emesis/NG output:950; Drains:100] Intake/Output this shift: No intake/output data recorded.  Constitutional: No acute distress, conversant, appears states age. Eyes: Anicteric sclerae, moist conjunctiva, no lid lag Lungs: Clear to auscultation bilaterally, normal respiratory effort CV: regular rate and rhythm, no murmurs, no peripheral edema, pedal pulses 2+ GI: Abdomen is tense and distended, nontender to palpation, fairly soft, no rebound, no guarding, no hepatosplenomegaly Skin: No rashes, palpation reveals normal turgor Psychiatric: appropriate judgment and insight, oriented to person, place, and time   Lab Results:  Recent Labs    02/03/19 0459 02/05/19 1206  WBC 8.8 8.1  HGB 12.8* 12.2*  HCT 39.7 37.2*  PLT 184 222   BMET Recent Labs    02/03/19 0459 02/05/19 1206  NA 135 135  K 4.2 4.7  CL 100 99  CO2 24 26  GLUCOSE 116* 136*  BUN 17 14  CREATININE 1.00 0.96  CALCIUM 8.6* 9.1   Studies/Results: Ct Abdomen Pelvis Wo Contrast  Result Date: 02/04/2019 CLINICAL  DATA:  Bowel obstruction versus ileus. Bladder rupture on Sunday. Laparotomy 01/30/2019 with bladder repair. EXAM: CT ABDOMEN AND PELVIS WITHOUT CONTRAST TECHNIQUE: Multidetector CT imaging of the abdomen and pelvis was performed following the standard protocol without IV contrast. COMPARISON:  Plain films 02/04/2019.  Most recent CT 01/30/2019 FINDINGS: Lower chest: Right base subsegmental atelectasis. Normal heart size without pericardial or pleural effusion. Multivessel coronary artery atherosclerosis. Hepatobiliary: Mild hepatic steatosis. Normal gallbladder, without biliary ductal dilatation. Pancreas: Normal, without mass or ductal dilatation. Spleen: Normal in size, without focal abnormality. Adrenals/Urinary Tract: Normal adrenal glands. 3 mm interpolar right renal collecting system calculus. No hydronephrosis. Foley catheter within the urinary bladder. Stomach/Bowel: Nasogastric tube terminates at the gastric body. The colon is normal in caliber. The terminal ileum is normal, including on image 60/3. Proximal and mid small bowel loops are dilated, including at up to 5.6 cm on image 50/3. The mid to distal ileum undergoes a gradual transition to normal caliber, without focal abnormality. No findings to suggest ischemia. Vascular/Lymphatic: Aortic and branch vessel atherosclerosis. No abdominopelvic adenopathy. Reproductive: Mild prostatomegaly. Other: A surgical drain from a left pelvic approach terminates in the cul-de-sac. No residual abdominopelvic fluid. No free intraperitoneal air. Musculoskeletal: Moderate left-sided gynecomastia. Lumbosacral spondylosis. IMPRESSION: 1. Relatively diffuse small bowel dilatation, without focal transition point. Findings are most consistent with postoperative adynamic ileus. 2. Right nephrolithiasis. 3.  Aortic Atherosclerosis (ICD10-I70.0). 4. Hepatic steatosis. 5. Left gynecomastia. Electronically Signed   By: Jeronimo GreavesKyle  Talbot M.D.   On: 02/04/2019 14:41   Dg Abd  Portable 1v  Result  Date: 02/05/2019 CLINICAL DATA:  Postoperative ileus. Hx of recent repair of ruptured bladder. Pt denies abdominal pain and constipation, stating he has only had ice and water to eat and drink and doesn't expect to be able to have a bowel movement without food. EXAM: PORTABLE ABDOMEN - 1 VIEW COMPARISON:  None. FINDINGS: There is a nasogastric tube with the tip projecting over the antrum of the stomach/pylorus. There is gaseous distension of small bowel and colon most consistent with a postoperative ileus. There is no evidence of pneumoperitoneum, portal venous gas or pneumatosis. There are no pathologic calcifications along the expected course of the ureters. The osseous structures are unremarkable. IMPRESSION: 1. Nasogastric tube with the tip projecting over the antrum of the stomach/pylorus. 2. Persistent gaseous distension of the small bowel and colon consistent with postoperative ileus. Electronically Signed   By: Elige Ko   On: 02/05/2019 13:35   Dg Abd Portable 1v  Result Date: 02/04/2019 CLINICAL DATA:  Ileus. EXAM: PORTABLE ABDOMEN - 1 VIEW COMPARISON:  One-view abdomen 02/01/2019 FINDINGS: Multiple dilated loops of small bowel are again noted. NG tube is in place. The stomach is decompressed. Overall bowel dilation is improved. IMPRESSION: 1. Improving dilation of small bowel. Electronically Signed   By: Marin Roberts M.D.   On: 02/04/2019 08:35    Anti-infectives: Anti-infectives (From admission, onward)   Start     Dose/Rate Route Frequency Ordered Stop   02/01/19 0100  cefTRIAXone (ROCEPHIN) 2 g in sodium chloride 0.9 % 100 mL IVPB  Status:  Discontinued     2 g 200 mL/hr over 30 Minutes Intravenous Every 24 hours 01/31/19 0223 02/03/19 0855   01/31/19 0030  cefTRIAXone (ROCEPHIN) 2 g in sodium chloride 0.9 % 100 mL IVPB     2 g 200 mL/hr over 30 Minutes Intravenous  Once 01/31/19 0015 01/31/19 0240      Assessment/Plan: s/p Procedure(s): EXPLORATORY  LAPAROTOMY (N/A) Bladder Repair Patient appears to have postoperative ileus. Agree with continuing NG tube to low intermittent suction.  Continue n.p.o.  Patient's electrolytes are within normal limits Patient appears to be passing some flatus I imagine this ileus would be self-limited, as there appears to be no bladder leak at this time We will continue to follow with you.   LOS: 6 days    Axel Filler 02/05/2019

## 2019-02-05 NOTE — Progress Notes (Addendum)
Patient Demographics:    Caleb Potter, is a 58 y.o. male, DOB - 1961/01/11, ZOX:[id number]  Admit date - 01/30/2019   Admitting Physician Bjorn Pippin, MD  Outpatient Primary MD for the patient is [name], MD  LOS - 6   Chief Complaint  Patient presents with   Near Syncope   Fall        Subjective:    Adonys Wildes today has no fevers,   No chest pain,  ... Complains of abdominal pain and nausea, no emesis  Assessment  & Plan :    Principal Problem:   Intraperitoneal rupture of bladder Active Problems:   Intraperitoneal hematoma   Hypertension   Coronary artery disease   AKI (acute kidney injury) (HCC)   Hyperkalemia   Anxiety   Brief Summary Caleb Potter is a 59 y.o. male with a history of HTN, CAD, and nephrolithiasis who presented to the [facility] ED with abdominal pain found to have intraperitoneal fluid, suspected hemorrhage on CT abd/pelvis with subsequent CT pyelogram demonstrating ruptured bladder and hyperkalemic AKI with creatinine 7.7, K 6.1. Urology performed ex lap with bladder repair 4/13. IV fluids, lokelma, bicarbonate, and calcium gluconate were provided as well. Postoperatively renal function and electrolytes have normalized, though he has an ileus vs. partial SBO with NG tube placed. Abdominal XR and CT abdomen and pelvis with contrast shows persistent dilated loops of small bowel.   A/p 1)Ileus vs. pSBO:  -  NG tube is been placed however patient feels it has not been working correctly.    We adjusted appears the NG tube apparatus and wall suction has been correctly fixed and has had approximately 2 L out of NG tube output.  Patient remains n.p.o., surgical consult from Dr. Derrell Lolling appreciated continue conservative management at this time for presumed postoperative ileus--electrolytes are within normal limits  2) AKI: Due to BOO and uremic labs due to reabsorption of  uroperitoneum. Resolved.  - Continue IVF while NPO -Continue to hold ACEi for now,  Avoid nephrotoxins  3)Alcohol abuse: Denies history of dependence or withdrawal. Last drink was at least 4 days PTA, no risk for DTs-May change from IV to oral folic acid and thiamine when oral intake resumes-    4)HTN--stable, continue IV metoprolol with parameters, continue to avoid ACEI  5)COPD: -No acute exacerbations, use bronchodilators as ordered  6)H/o CAD--no ACS type symptoms at that time, IV metoprolol as ordered, once oral intake resumes will restart aspirin and statin  7)Bladder rupture: s/p ex lap, repair 4/13.  - Per urology. - Abx per urology, currently on CTX. WBC normalized on 4/16.   8)Acute blood loss anemia: Mild, stable, hemoglobin above 12 , no ongoing bleeding   9)FEN--patient is n.p.o. due to postop ileus, continue IV dextrose solution until oral intake resumes  Disposition/Need for in-Hospital Stay- patient unable to be discharged at this time due to persistent postop ileus with inability to take oral intake patient needs IV fluids while n.p.o.  Code Status : full  Family Communication:   na   Disposition Plan  : TBD  Consults  : Urology is primary team, general surgery was consulted  DVT Prophylaxis  :    Heparin - SCDs   Lab Results  Component Value Date   PLT 222 02/05/2019    Inpatient Medications  Scheduled Meds:  finasteride  5 mg Oral Daily   fluticasone furoate-vilanterol  1 puff Inhalation Daily   folic acid  1 mg Intravenous Daily   heparin injection (subcutaneous)  5,000 Units Subcutaneous Q8H   metoprolol tartrate  5 mg Intravenous Q6H   thiamine injection  100 mg Intravenous Daily   umeclidinium bromide  1 puff Inhalation Daily   Continuous Infusions:  dextrose 5 % and 0.9% NaCl 100 mL/hr at 02/05/19 0207   lactated ringers 100 mL/hr at 02/03/19 0309   PRN Meds:.acetaminophen **OR** acetaminophen, bisacodyl, diphenhydrAMINE  **OR** diphenhydrAMINE, HYDROcodone-acetaminophen, HYDROmorphone (DILAUDID) injection, LORazepam, morphine injection, ondansetron **OR** ondansetron (ZOFRAN) IV, oxybutynin, oxyCODONE, promethazine, senna-docusate, sodium phosphate, zolpidem    Anti-infectives (From admission, onward)   Start     Dose/Rate Route Frequency Ordered Stop   02/01/19 0100  cefTRIAXone (ROCEPHIN) 2 g in sodium chloride 0.9 % 100 mL IVPB  Status:  Discontinued     2 g 200 mL/hr over 30 Minutes Intravenous Every 24 hours 01/31/19 0223 02/03/19 0855   01/31/19 0030  cefTRIAXone (ROCEPHIN) 2 g in sodium chloride 0.9 % 100 mL IVPB     2 g 200 mL/hr over 30 Minutes Intravenous  Once 01/31/19 0015 01/31/19 0240        Objective:   Vitals:   02/05/19 0349 02/05/19 0759 02/05/19 0839 02/05/19 1127  BP: 106/69 112/78  124/81  Pulse: 100 97  96  Resp: Temp: 98.9 F (37.2 C) 98.7 F (37.1 C)  98.8 F (37.1 C)  TempSrc: Oral Oral  Oral  SpO2: 95% 97% 96% 95%  Weight:      Height:        Wt Readings from Last 3 Encounters:  02/04/19 100.2 kg  11/07/14 93.4 kg  11/03/14 93.4 kg     Intake/Output Summary (Last 24 hours) at 02/05/2019 1529 Last data filed at 02/05/2019 0500 Gross per 24 hour  Intake 699.58 ml  Output 1100 ml  Net -400.42 ml     Physical Exam Patient is examined daily including today on 02/05/19 , exams remain the same as of yesterday except that has changed   Gen:- Awake Alert,  In no apparent distress  HEENT:- [place].AT, No sclera icterus NOse-NG tube with gastric contents Neck-Supple Neck,No JVD,.   Lungs-  CTAB , fair symmetrical air movement CV- S1, S2 normal, regular  Abd-abdomen is somewhat tense, distended, diminished bowel sounds, tender without rebound or guarding, postop wound is clean dry and intact Extremity/Skin:- No  edema, pedal pulses present  Psych-affect is appropriate, oriented x3 Neuro-no new focal deficits, no tremors GU--Foley with clear urine   Data  Review:   Micro Results Recent Results (from the past 240 hour(s))  MRSA PCR Screening     Status: None   Collection Time: 01/31/19  2:40 AM  Result Value Ref Range Status   MRSA by PCR NEGATIVE NEGATIVE Final    Comment:        The GeneXpert MRSA Assay (FDA approved for NASAL specimens only), is one component of a comprehensive MRSA colonization surveillance program. It is not intended to diagnose MRSA infection nor to guide or monitor treatment for MRSA infections. Performed at [facility], [address], [place], [place] [address]     Radiology Reports Ct Abdomen Pelvis Wo Contrast  Result Date: 02/04/2019 CLINICAL DATA:  Bowel obstruction versus ileus.  Bladder rupture on Sunday. Laparotomy 01/30/2019 with bladder repair. EXAM: CT ABDOMEN AND PELVIS WITHOUT CONTRAST TECHNIQUE: Multidetector CT imaging of the abdomen and pelvis was performed following the standard protocol without IV contrast. COMPARISON:  Plain films 02/04/2019.  Most recent CT 01/30/2019 FINDINGS: Lower chest: Right base subsegmental atelectasis. Normal heart size without pericardial or pleural effusion. Multivessel coronary artery atherosclerosis. Hepatobiliary: Mild hepatic steatosis. Normal gallbladder, without biliary ductal dilatation. Pancreas: Normal, without mass or ductal dilatation. Spleen: Normal in size, without focal abnormality. Adrenals/Urinary Tract: Normal adrenal glands. 3 mm interpolar right renal collecting system calculus. No hydronephrosis. Foley catheter within the urinary bladder. Stomach/Bowel: Nasogastric tube terminates at the gastric body. The colon is normal in caliber. The terminal ileum is normal, including on image 60/3. Proximal and mid small bowel loops are dilated, including at up to 5.6 cm on image 50/3. The mid to distal ileum undergoes a gradual transition to normal caliber, without focal abnormality. No findings to suggest ischemia. Vascular/Lymphatic: Aortic and branch  vessel atherosclerosis. No abdominopelvic adenopathy. Reproductive: Mild prostatomegaly. Other: A surgical drain from a left pelvic approach terminates in the cul-de-sac. No residual abdominopelvic fluid. No free intraperitoneal air. Musculoskeletal: Moderate left-sided gynecomastia. Lumbosacral spondylosis. IMPRESSION: 1. Relatively diffuse small bowel dilatation, without focal transition point. Findings are most consistent with postoperative adynamic ileus. 2. Right nephrolithiasis. 3.  Aortic Atherosclerosis (ICD10-I70.0). 4. Hepatic steatosis. 5. Left gynecomastia. Electronically Signed   By: Jeronimo Greaves M.D.   On: 02/04/2019 14:41   Ct Abdomen Pelvis Wo Contrast  Result Date: 01/30/2019 CLINICAL DATA:  Gross hematuria.  Bilateral flank pain after fall. EXAM: CT ABDOMEN AND PELVIS WITHOUT CONTRAST TECHNIQUE: Multidetector CT imaging of the abdomen and pelvis was performed following the standard protocol without IV contrast. COMPARISON:  CT scan of January 26, 2019. FINDINGS: Lower chest: No acute abnormality. Hepatobiliary: No gallstones or biliary dilatation is noted. Mild amount of fluid is noted around the liver with dense material seen posteriorly consistent with hematoma. Pancreas: Unremarkable. No pancreatic ductal dilatation or surrounding inflammatory changes. Spleen: Fluid is noted around the spleen which was not present on prior exam and is concerning for hemorrhage. Adrenals/Urinary Tract: Adrenal glands appear normal. Nonobstructive right renal calculus is noted. No hydronephrosis or renal obstruction is noted. The amount of hemorrhage seen in urinary bladder on prior exam is significantly decreased, although residual hemorrhage does remain in the dependent portion of the bladder. Stomach/Bowel: The stomach appears normal. There is no evidence of bowel obstruction or inflammation. The appendix is not clearly visualized. Vascular/Lymphatic: Aortic atherosclerosis. No enlarged abdominal or pelvic  lymph nodes. Reproductive: Stable mild prostatic enlargement is noted. Other: There does appear to be a moderate amount of hemorrhage present in the dependent portion of the pelvis as well as in the left pericolic gutter. Musculoskeletal: No acute or significant osseous findings. IMPRESSION: There is interval development of a moderate amount of intraperitoneal hemorrhage seen in the pelvis, both pericolic gutters, and around the liver and spleen. Critical Value/emergent results were called by telephone at the time of interpretation on 01/30/2019 at 8:29 pm to Dr. Terance Hart , who verbally acknowledged these results. The large amount of hemorrhage seen within the urinary bladder on prior exam has significantly improved, with only a mild amount of residual hemorrhage or thrombus seen posteriorly in the urinary bladder on current exam. Nonobstructive right renal calculus. No hydronephrosis or renal obstruction is noted. Stable mild prostatic enlargement. Electronically Signed   By: Lupita Raider, M.D.  On: 01/30/2019 20:30   Dg Abd 1 View  Result Date: 02/01/2019 CLINICAL DATA:  Enteric tube placement EXAM: ABDOMEN - 1 VIEW COMPARISON:  None. FINDINGS: The tip of the enteric tube is at the expected location of the gastroduodenal junction or first portion of the duodenum. The side port is still likely within the stomach. There are multiple dilated loops of bowel throughout the abdomen. IMPRESSION: Enteric tube tip near the gastroduodenal junction. Electronically Signed   By: Deatra Robinson M.D.   On: 02/01/2019 14:09   Ct Pelvis Wo Contrast  Result Date: 01/30/2019 CLINICAL DATA:  Considerable free fluid within the abdomen on recent CT, request is been made for CT cystography. EXAM: CT PELVIS WITHOUT CONTRAST TECHNIQUE: Multidetector CT imaging of the pelvis was performed following the standard protocol without intravenous contrast. COMPARISON:  CT from earlier in the same day. FINDINGS: Urinary Tract:  Contrast material is been instilled through the Foley catheter. The Foley catheter balloon is well visualized. The bladder distends well and demonstrates diffuse intraluminal filling defect consistent with residual thrombus. Additionally there is extravasation of contrast material from the dome of the bladder in a linear fashion which extends for approximately 2.9 cm consistent with bladder rupture. Contrast enhancement of the intraperitoneal fluid seen on the recent CT examination consistent with the extravasation. Bowel:  Bowel is again within normal limits. Vascular/Lymphatic: Atherosclerotic calcifications are seen. No lymphadenopathy is noted. Reproductive: Prostate is mildly prominent but stable from the recent exam. Other: Free fluid is again noted within the abdomen and pelvis related to the bladder perforation. Musculoskeletal: Stable in appearance when compare with the prior CT. IMPRESSION: CT cystography reveals a long segment of bladder discontinuity along the dome consistent with rupture. Passage of contrast material into the peritoneal cavity is seen. Retained thrombus within the bladder is noted. Electronically Signed   By: Alcide Clever M.D.   On: 01/30/2019 23:20   Dg Chest Portable 1 View  Result Date: 01/30/2019 CLINICAL DATA:  Chest pain EXAM: PORTABLE CHEST 1 VIEW COMPARISON:  11/03/2014 FINDINGS: Cardiac shadows within normal limits. Tortuosity of the thoracic aorta is identified somewhat accentuated by the portable technique and patient rotation. The lungs are clear. No acute bony abnormality is noted. IMPRESSION: No acute abnormality noted. Electronically Signed   By: Alcide Clever M.D.   On: 01/30/2019 19:55   Dg Abd Portable 1v  Result Date: 02/05/2019 CLINICAL DATA:  Postoperative ileus. Hx of recent repair of ruptured bladder. Pt denies abdominal pain and constipation, stating he has only had ice and water to eat and drink and doesn't expect to be able to have a bowel movement  without food. EXAM: PORTABLE ABDOMEN - 1 VIEW COMPARISON:  None. FINDINGS: There is a nasogastric tube with the tip projecting over the antrum of the stomach/pylorus. There is gaseous distension of small bowel and colon most consistent with a postoperative ileus. There is no evidence of pneumoperitoneum, portal venous gas or pneumatosis. There are no pathologic calcifications along the expected course of the ureters. The osseous structures are unremarkable. IMPRESSION: 1. Nasogastric tube with the tip projecting over the antrum of the stomach/pylorus. 2. Persistent gaseous distension of the small bowel and colon consistent with postoperative ileus. Electronically Signed   By: Elige Ko   On: 02/05/2019 13:35   Dg Abd Portable 1v  Result Date: 02/04/2019 CLINICAL DATA:  Ileus. EXAM: PORTABLE ABDOMEN - 1 VIEW COMPARISON:  One-view abdomen 02/01/2019 FINDINGS: Multiple dilated loops of small bowel are again noted.  NG tube is in place. The stomach is decompressed. Overall bowel dilation is improved. IMPRESSION: 1. Improving dilation of small bowel. Electronically Signed   By: Marin Robertshristopher  Mattern M.D.   On: 02/04/2019 08:35   Dg Abd Portable 1v  Result Date: 02/01/2019 CLINICAL DATA:  Abdominal distension. EXAM: PORTABLE ABDOMEN - 1 VIEW COMPARISON:  CT scan January 30, 2019 FINDINGS: Lung bases are normal. No free air, portal venous gas, or pneumatosis on the supine view. Dilated loops of small bowel are identified. There is also air seen within the ascending and transverse colon which are mildly prominent. The dilated small bowel loops measure up to 5.2 cm. The transverse colon measures 6.6 cm. No other acute abnormalities. IMPRESSION: 1. Dilated loops of small bowel and mildly prominent loops of colon. The small bowel appears to be dilated out of proportion to the colon suggesting early or partial small bowel obstruction. Given the amount of air in the colon, a developing ileus is also a possibility.  Recommend clinical correlation. A CT scan could further evaluate if clinically warranted. Otherwise, recommend attention on close follow-up. These results will be called to the ordering clinician or representative by the Radiologist Assistant, and communication documented in the PACS or zVision Dashboard. Electronically Signed   By: Gerome Samavid  Williams III M.D   On: 02/01/2019 08:18     CBC Recent Labs  Lab 01/30/19 1920 01/30/19 2241 01/31/19 0302 02/01/19 0848 02/02/19 0226 02/03/19 0459 02/05/19 1206  WBC 6.7 11.3*  --  14.0* 10.9* 8.8 8.1  HGB 11.7* 11.6* 12.5* 13.4 13.6 12.8* 12.2*  HCT 35.9* 35.9* 38.5* 40.3 40.8 39.7 37.2*  PLT 176 142*  --  168 190 184 222  MCV 102.6* 100.3*  --  98.5 98.3 98.8 97.4  MCH 33.4 32.4  --  32.8 32.8 31.8 31.9  MCHC 32.6 32.3  --  33.3 33.3 32.2 32.8  RDW 13.7 14.5  --  15.1 14.7 13.9 13.3  LYMPHSABS 1.4  --   --   --   --   --   --   MONOABS 0.9  --   --   --   --   --   --   EOSABS 0.1  --   --   --   --   --   --   BASOSABS 0.0  --   --   --   --   --   --     Chemistries  Recent Labs  Lab 01/30/19 1920  01/31/19 0815  01/31/19 2039 02/01/19 0848 02/02/19 0226 02/03/19 0459 02/05/19 1206  NA 134*   < > 136  --   --  134* 137 135 135  K 6.1*   < > 5.1   < > 5.0 4.8 4.5 4.2 4.7  CL 101   < > 105  --   --  102 102 100 99  CO2 21*   < > 23  --   --  25 24 24 26   GLUCOSE 120*   < > 86  --   --  159* 134* 116* 136*  BUN 64*   < > 47*  --   --  20 17 17 14   CREATININE 7.76*   < > 4.52*  --   --  1.48* 1.11 1.00 0.96  CALCIUM 9.4   < > 8.2*  --   --  9.0 8.8* 8.6* 9.1  AST 36  --   --   --   --   --   --   --   --  ALT 30  --   --   --   --   --   --   --   --   ALKPHOS 65  --   --   --   --   --   --   --   --   BILITOT 1.0  --   --   --   --   --   --   --   --    < > = values in this interval not displayed.   ------------------------------------------------------------------------------------------------------------------ No results  for input(s): CHOL, HDL, LDLCALC, TRIG, CHOLHDL, LDLDIRECT in the last 72 hours.  No results found for: HGBA1C ------------------------------------------------------------------------------------------------------------------ No results for input(s): TSH, T4TOTAL, T3FREE, THYROIDAB in the last 72 hours.  Invalid input(s): FREET3 ------------------------------------------------------------------------------------------------------------------ No results for input(s): VITAMINB12, FOLATE, FERRITIN, TIBC, IRON, RETICCTPCT in the last 72 hours.  Coagulation profile Recent Labs  Lab 01/30/19 1920  INR 1.0    No results for input(s): DDIMER in the last 72 hours.  Cardiac Enzymes Recent Labs  Lab 01/30/19 1920  TROPONINI <0.03   ------------------------------------------------------------------------------------------------------------------ No results found for: BNP   Shon Hale M.D on 02/05/2019 at 3:29 PM  Go to [email or web address] - for contact info  [facility] - Office  [phone]

## 2019-02-06 LAB — CBC
HCT: 38.9 % — ABNORMAL LOW (ref 39.0–52.0)
Hemoglobin: 12.5 g/dL — ABNORMAL LOW (ref 13.0–17.0)
MCH: 31.7 pg (ref 26.0–34.0)
MCHC: 32.1 g/dL (ref 30.0–36.0)
MCV: 98.7 fL (ref 80.0–100.0)
Platelets: 262 10*3/uL (ref 150–400)
RBC: 3.94 MIL/uL — ABNORMAL LOW (ref 4.22–5.81)
RDW: 13.3 % (ref 11.5–15.5)
WBC: 9.6 10*3/uL (ref 4.0–10.5)
nRBC: 0 % (ref 0.0–0.2)

## 2019-02-06 LAB — BASIC METABOLIC PANEL
Anion gap: 10 (ref 5–15)
BUN: 13 mg/dL (ref 6–20)
CO2: 28 mmol/L (ref 22–32)
Calcium: 9.2 mg/dL (ref 8.9–10.3)
Chloride: 97 mmol/L — ABNORMAL LOW (ref 98–111)
Creatinine, Ser: 1.11 mg/dL (ref 0.61–1.24)
GFR calc Af Amer: >60 mL/min (ref >60)
GFR calc non Af Amer: >60 mL/min (ref >60)
Glucose, Bld: 129 mg/dL — ABNORMAL HIGH (ref 70–99)
Potassium: 4.9 mmol/L (ref 3.5–5.1)
Sodium: 135 mmol/L (ref 135–145)

## 2019-02-06 LAB — CREATININE, FLUID (PLEURAL, PERITONEAL, JP DRAINAGE): Creat, Fluid: 1.3 mg/dL

## 2019-02-06 MED ORDER — MENTHOL 3 MG MT LOZG
1.0000 | LOZENGE | OROMUCOSAL | Status: DC | PRN
  Administered 2019-02-08: 3 mg via ORAL
  Filled 2019-02-06: qty 9

## 2019-02-06 NOTE — Progress Notes (Signed)
7 Days Post-Op Subjective: Patient reports no significant changes.  May be a small amount of gas but nothing significant and no bowel movements.  He is moving around pretty good and getting out of bed.  He denies any chest pain shortness of breath or abdominal pain.  Objective: Vital signs in last 24 hours: Temp:  [97.9 F (36.6 C)-98.8 F (37.1 C)] 97.9 F (36.6 C) (04/19 0804) Pulse Rate:  [94-110] 98 (04/19 0804) Resp:  [17-20] 18 (04/19 0804) BP: (109-124)/(70-82) 116/82 (04/19 0804) SpO2:  [92 %-98 %] 95 % (04/19 0806)  Intake/Output from previous day: 04/18 0701 - 04/19 0700 In: -  Out: 2850 [Emesis/NG output:2850] Intake/Output this shift: No intake/output data recorded. About 2400 in at 100 / hr   Physical Exam:  He is up today taking a bath at the sink.  He sat back down in the chair No acute distress Alert and oriented No focal neurologic deficits Respiratory effort and depth is normal Abdomen is distended but soft and nontender.  His incision is clean dry and intact.  About 100 in the JP.  Bowel sounds are present but hypoactive. Extremities without calf pain or swelling  Lab Results: Recent Labs    02/05/19 1206 02/06/19 0440  HGB 12.2* 12.5*  HCT 37.2* 38.9*   BMET Recent Labs    02/05/19 1206 02/06/19 0440  NA 135 135  K 4.7 4.9  CL 99 97*  CO2 26 28  GLUCOSE 136* 129*  BUN 14 13  CREATININE 0.96 1.11  CALCIUM 9.1 9.2   No results for input(s): LABPT, INR in the last 72 hours. No results for input(s): LABURIN in the last 72 hours. Results for orders placed or performed during the hospital encounter of 01/30/19  MRSA PCR Screening     Status: None   Collection Time: 01/31/19  2:40 AM  Result Value Ref Range Status   MRSA by PCR NEGATIVE NEGATIVE Final    Comment:        The GeneXpert MRSA Assay (FDA approved for NASAL specimens only), is one component of a comprehensive MRSA colonization surveillance program. It is not intended to  diagnose MRSA infection nor to guide or monitor treatment for MRSA infections. Performed at [facility], [address], [place], [place] [address]     Studies/Results: Ct Abdomen Pelvis Wo Contrast  Result Date: 02/04/2019 CLINICAL DATA:  Bowel obstruction versus ileus. Bladder rupture on Sunday. Laparotomy 01/30/2019 with bladder repair. EXAM: CT ABDOMEN AND PELVIS WITHOUT CONTRAST TECHNIQUE: Multidetector CT imaging of the abdomen and pelvis was performed following the standard protocol without IV contrast. COMPARISON:  Plain films 02/04/2019.  Most recent CT 01/30/2019 FINDINGS: Lower chest: Right base subsegmental atelectasis. Normal heart size without pericardial or pleural effusion. Multivessel coronary artery atherosclerosis. Hepatobiliary: Mild hepatic steatosis. Normal gallbladder, without biliary ductal dilatation. Pancreas: Normal, without mass or ductal dilatation. Spleen: Normal in size, without focal abnormality. Adrenals/Urinary Tract: Normal adrenal glands. 3 mm interpolar right renal collecting system calculus. No hydronephrosis. Foley catheter within the urinary bladder. Stomach/Bowel: Nasogastric tube terminates at the gastric body. The colon is normal in caliber. The terminal ileum is normal, including on image 60/3. Proximal and mid small bowel loops are dilated, including at up to 5.6 cm on image 50/3. The mid to distal ileum undergoes a gradual transition to normal caliber, without focal abnormality. No findings to suggest ischemia. Vascular/Lymphatic: Aortic and branch vessel atherosclerosis. No abdominopelvic adenopathy. Reproductive: Mild prostatomegaly. Other: A surgical drain  from a left pelvic approach terminates in the cul-de-sac. No residual abdominopelvic fluid. No free intraperitoneal air. Musculoskeletal: Moderate left-sided gynecomastia. Lumbosacral spondylosis. IMPRESSION: 1. Relatively diffuse small bowel dilatation, without focal transition point. Findings are  most consistent with postoperative adynamic ileus. 2. Right nephrolithiasis. 3.  Aortic Atherosclerosis (ICD10-I70.0). 4. Hepatic steatosis. 5. Left gynecomastia. Electronically Signed   By: Jeronimo Greaves M.D.   On: 02/04/2019 14:41   Dg Abd Portable 1v  Result Date: 02/05/2019 CLINICAL DATA:  Postoperative ileus. Hx of recent repair of ruptured bladder. Pt denies abdominal pain and constipation, stating he has only had ice and water to eat and drink and doesn't expect to be able to have a bowel movement without food. EXAM: PORTABLE ABDOMEN - 1 VIEW COMPARISON:  None. FINDINGS: There is a nasogastric tube with the tip projecting over the antrum of the stomach/pylorus. There is gaseous distension of small bowel and colon most consistent with a postoperative ileus. There is no evidence of pneumoperitoneum, portal venous gas or pneumatosis. There are no pathologic calcifications along the expected course of the ureters. The osseous structures are unremarkable. IMPRESSION: 1. Nasogastric tube with the tip projecting over the antrum of the stomach/pylorus. 2. Persistent gaseous distension of the small bowel and colon consistent with postoperative ileus. Electronically Signed   By: Elige Ko   On: 02/05/2019 13:35    Assessment/Plan: POD#6 Exploratory laparotomy with repair of bladder rupture. 22Fr foley. NG, JP.  --Minimal JP drainage, creatinine normal so doubt urine leak -Continued ileus- appreciate PA Focht/Dr. Derrell Lolling' eval and recs. Pt may need TP/PICC.  --Increase IVF rate to 125 cc/hr --BMP in AM    LOS: 7 days   Jerilee Field 02/06/2019, 10:23 AM

## 2019-02-06 NOTE — Progress Notes (Signed)
Patient Demographics:    Caleb Potter, is a 58 y.o. male, DOB - Oct 03, 1961, AVW:[id number]  Admit date - 01/30/2019   Admitting Physician Bjorn Pippin, MD  Outpatient Primary MD for the patient is [name], MD  LOS - 7   Chief Complaint  Patient presents with   Near Syncope   Fall        Subjective:    Caleb Potter today has no fevers,   No chest pain,  ... From NG tube, no BM, no flatus, abdominal distention abdominal discomfort appears to be improving  Assessment  & Plan :    Principal Problem:   Intraperitoneal rupture of bladder Active Problems:   Intraperitoneal hematoma   Hypertension   Coronary artery disease   AKI (acute kidney injury) (HCC)   Hyperkalemia   Anxiety   Brief Summary Caleb Potter is a 58 y.o. male with a history of HTN, CAD, and nephrolithiasis who presented to the [facility] ED with abdominal pain found to have intraperitoneal fluid, suspected hemorrhage on CT abd/pelvis with subsequent CT pyelogram demonstrating ruptured bladder and hyperkalemic AKI with creatinine 7.7, K 6.1. Urology performed ex lap with bladder repair 4/13. IV fluids, lokelma, bicarbonate, and calcium gluconate were provided as well. Postoperatively renal function and electrolytes have normalized, though he has an ileus vs. partial SBO with NG tube placed. Abdominal XR and CT abdomen and pelvis with contrast shows persistent dilated loops of small bowel.   A/p 1)Ileus vs. pSBO:  -  Very high output from NG tube, Patient remains n.p.o., surgical consult from Dr. Derrell Lolling appreciated continue conservative management at this time for presumed postoperative ileus--electrolytes are within normal limits  2) AKI: Due to BOO and uremic labs due to reabsorption of uroperitoneum. Resolved.  - Continue IVF D5 normal saline at 125 mL an hour while NPO -Continue to hold ACEi for now,  Avoid  nephrotoxins  3)Alcohol abuse: Denies history of dependence or withdrawal. Last drink was at least 4 days PTA, no risk for DTs-May change from IV to oral folic acid and thiamine when oral intake resumes-    4)HTN--stable, continue IV metoprolol with parameters, continue to avoid ACEI  5)COPD: -No acute exacerbations, use bronchodilators as ordered  6)H/o CAD--no ACS type symptoms at that time, IV metoprolol as ordered, once oral intake resumes will restart aspirin and statin  7)Bladder rupture: s/p ex lap, repair 4/13.  - Per urology. - Abx per urology, currently on CTX. WBC normalized on 4/16.   8)Acute blood loss anemia: Mild, stable, hemoglobin above 12 , no ongoing bleeding   9)FEN--patient is n.p.o. due to postop ileus, continue IV dextrose solution until oral intake resumes  Disposition/Need for in-Hospital Stay- patient unable to be discharged at this time due to persistent postop ileus with inability to take oral intake patient needs IV fluids while n.p.o.  Code Status : full  Family Communication:   na   Disposition Plan  : TBD  Consults  : Urology is primary team, general surgery was consulted  DVT Prophylaxis  :    Heparin - SCDs   Lab Results  Component Value Date   PLT 262 02/06/2019    Inpatient Medications  Scheduled Meds:  finasteride  5 mg  Oral Daily   fluticasone furoate-vilanterol  1 puff Inhalation Daily   folic acid  1 mg Intravenous Daily   heparin injection (subcutaneous)  5,000 Units Subcutaneous Q8H   metoprolol tartrate  5 mg Intravenous Q6H   thiamine injection  100 mg Intravenous Daily   umeclidinium bromide  1 puff Inhalation Daily   Continuous Infusions:  dextrose 5 % and 0.9% NaCl 125 mL/hr at 02/06/19 1035   lactated ringers 100 mL/hr at 02/03/19 0309   PRN Meds:.acetaminophen **OR** acetaminophen, bisacodyl, diphenhydrAMINE **OR** diphenhydrAMINE, HYDROcodone-acetaminophen, HYDROmorphone (DILAUDID) injection, LORazepam,  menthol-cetylpyridinium, morphine injection, ondansetron **OR** ondansetron (ZOFRAN) IV, oxybutynin, oxyCODONE, promethazine, senna-docusate, sodium phosphate, zolpidem    Anti-infectives (From admission, onward)   Start     Dose/Rate Route Frequency Ordered Stop   02/01/19 0100  cefTRIAXone (ROCEPHIN) 2 g in sodium chloride 0.9 % 100 mL IVPB  Status:  Discontinued     2 g 200 mL/hr over 30 Minutes Intravenous Every 24 hours 01/31/19 0223 02/03/19 0855   01/31/19 0030  cefTRIAXone (ROCEPHIN) 2 g in sodium chloride 0.9 % 100 mL IVPB     2 g 200 mL/hr over 30 Minutes Intravenous  Once 01/31/19 0015 01/31/19 0240        Objective:   Vitals:   02/06/19 0410 02/06/19 0804 02/06/19 0806 02/06/19 1245  BP: 109/70 116/82  126/74  Pulse: (!) 103 98  (!) 108  Resp: Temp: 98.3 F (36.8 C) 97.9 F (36.6 C)  98.1 F (36.7 C)  TempSrc: Oral Oral  Oral  SpO2: 93% 94% 95% 94%  Weight:      Height:        Wt Readings from Last 3 Encounters:  02/04/19 100.2 kg  11/07/14 93.4 kg  11/03/14 93.4 kg     Intake/Output Summary (Last 24 hours) at 02/06/2019 1626 Last data filed at 02/06/2019 1000 Gross per 24 hour  Intake --  Output 2850 ml  Net -2850 ml     Physical Exam Patient is examined daily including today on 02/06/19 , exams remain the same as of yesterday except that has changed   Gen:- Awake Alert,  In no apparent distress  HEENT:- [place].AT, No sclera icterus NGT--gastric drainage/contents NOse-NG tube with gastric contents Neck-Supple Neck,No JVD,.   Lungs-  CTAB , fair symmetrical air movement CV- S1, S2 normal, regular  Abd-abdomen is less tense, distended, diminished bowel sounds, tender without rebound or guarding, postop wound is clean dry and intact Extremity/Skin:- No  edema, pedal pulses present  Psych-affect is appropriate, oriented x3 Neuro-no new focal deficits, no tremors GU--Foley with clear urine   Data Review:   Micro Results Recent Results  (from the past 240 hour(s))  MRSA PCR Screening     Status: None   Collection Time: 01/31/19  2:40 AM  Result Value Ref Range Status   MRSA by PCR NEGATIVE NEGATIVE Final    Comment:        The GeneXpert MRSA Assay (FDA approved for NASAL specimens only), is one component of a comprehensive MRSA colonization surveillance program. It is not intended to diagnose MRSA infection nor to guide or monitor treatment for MRSA infections. Performed at [facility], [address], [place], [place] [address]     Radiology Reports Ct Abdomen Pelvis Wo Contrast  Result Date: 02/04/2019 CLINICAL DATA:  Bowel obstruction versus ileus. Bladder rupture on Sunday. Laparotomy 01/30/2019 with bladder repair. EXAM: CT ABDOMEN AND PELVIS WITHOUT CONTRAST TECHNIQUE:  Multidetector CT imaging of the abdomen and pelvis was performed following the standard protocol without IV contrast. COMPARISON:  Plain films 02/04/2019.  Most recent CT 01/30/2019 FINDINGS: Lower chest: Right base subsegmental atelectasis. Normal heart size without pericardial or pleural effusion. Multivessel coronary artery atherosclerosis. Hepatobiliary: Mild hepatic steatosis. Normal gallbladder, without biliary ductal dilatation. Pancreas: Normal, without mass or ductal dilatation. Spleen: Normal in size, without focal abnormality. Adrenals/Urinary Tract: Normal adrenal glands. 3 mm interpolar right renal collecting system calculus. No hydronephrosis. Foley catheter within the urinary bladder. Stomach/Bowel: Nasogastric tube terminates at the gastric body. The colon is normal in caliber. The terminal ileum is normal, including on image 60/3. Proximal and mid small bowel loops are dilated, including at up to 5.6 cm on image 50/3. The mid to distal ileum undergoes a gradual transition to normal caliber, without focal abnormality. No findings to suggest ischemia. Vascular/Lymphatic: Aortic and branch vessel atherosclerosis. No abdominopelvic  adenopathy. Reproductive: Mild prostatomegaly. Other: A surgical drain from a left pelvic approach terminates in the cul-de-sac. No residual abdominopelvic fluid. No free intraperitoneal air. Musculoskeletal: Moderate left-sided gynecomastia. Lumbosacral spondylosis. IMPRESSION: 1. Relatively diffuse small bowel dilatation, without focal transition point. Findings are most consistent with postoperative adynamic ileus. 2. Right nephrolithiasis. 3.  Aortic Atherosclerosis (ICD10-I70.0). 4. Hepatic steatosis. 5. Left gynecomastia. Electronically Signed   By: Jeronimo Greaves M.D.   On: 02/04/2019 14:41   Ct Abdomen Pelvis Wo Contrast  Result Date: 01/30/2019 CLINICAL DATA:  Gross hematuria.  Bilateral flank pain after fall. EXAM: CT ABDOMEN AND PELVIS WITHOUT CONTRAST TECHNIQUE: Multidetector CT imaging of the abdomen and pelvis was performed following the standard protocol without IV contrast. COMPARISON:  CT scan of January 26, 2019. FINDINGS: Lower chest: No acute abnormality. Hepatobiliary: No gallstones or biliary dilatation is noted. Mild amount of fluid is noted around the liver with dense material seen posteriorly consistent with hematoma. Pancreas: Unremarkable. No pancreatic ductal dilatation or surrounding inflammatory changes. Spleen: Fluid is noted around the spleen which was not present on prior exam and is concerning for hemorrhage. Adrenals/Urinary Tract: Adrenal glands appear normal. Nonobstructive right renal calculus is noted. No hydronephrosis or renal obstruction is noted. The amount of hemorrhage seen in urinary bladder on prior exam is significantly decreased, although residual hemorrhage does remain in the dependent portion of the bladder. Stomach/Bowel: The stomach appears normal. There is no evidence of bowel obstruction or inflammation. The appendix is not clearly visualized. Vascular/Lymphatic: Aortic atherosclerosis. No enlarged abdominal or pelvic lymph nodes. Reproductive: Stable mild  prostatic enlargement is noted. Other: There does appear to be a moderate amount of hemorrhage present in the dependent portion of the pelvis as well as in the left pericolic gutter. Musculoskeletal: No acute or significant osseous findings. IMPRESSION: There is interval development of a moderate amount of intraperitoneal hemorrhage seen in the pelvis, both pericolic gutters, and around the liver and spleen. Critical Value/emergent results were called by telephone at the time of interpretation on 01/30/2019 at 8:29 pm to Dr. Terance Hart , who verbally acknowledged these results. The large amount of hemorrhage seen within the urinary bladder on prior exam has significantly improved, with only a mild amount of residual hemorrhage or thrombus seen posteriorly in the urinary bladder on current exam. Nonobstructive right renal calculus. No hydronephrosis or renal obstruction is noted. Stable mild prostatic enlargement. Electronically Signed   By: Lupita Raider, M.D.   On: 01/30/2019 20:30   Dg Abd 1 View  Result Date: 02/01/2019 CLINICAL DATA:  Enteric tube placement EXAM: ABDOMEN - 1 VIEW COMPARISON:  None. FINDINGS: The tip of the enteric tube is at the expected location of the gastroduodenal junction or first portion of the duodenum. The side port is still likely within the stomach. There are multiple dilated loops of bowel throughout the abdomen. IMPRESSION: Enteric tube tip near the gastroduodenal junction. Electronically Signed   By: Deatra Robinson M.D.   On: 02/01/2019 14:09   Ct Pelvis Wo Contrast  Result Date: 01/30/2019 CLINICAL DATA:  Considerable free fluid within the abdomen on recent CT, request is been made for CT cystography. EXAM: CT PELVIS WITHOUT CONTRAST TECHNIQUE: Multidetector CT imaging of the pelvis was performed following the standard protocol without intravenous contrast. COMPARISON:  CT from earlier in the same day. FINDINGS: Urinary Tract: Contrast material is been instilled through  the Foley catheter. The Foley catheter balloon is well visualized. The bladder distends well and demonstrates diffuse intraluminal filling defect consistent with residual thrombus. Additionally there is extravasation of contrast material from the dome of the bladder in a linear fashion which extends for approximately 2.9 cm consistent with bladder rupture. Contrast enhancement of the intraperitoneal fluid seen on the recent CT examination consistent with the extravasation. Bowel:  Bowel is again within normal limits. Vascular/Lymphatic: Atherosclerotic calcifications are seen. No lymphadenopathy is noted. Reproductive: Prostate is mildly prominent but stable from the recent exam. Other: Free fluid is again noted within the abdomen and pelvis related to the bladder perforation. Musculoskeletal: Stable in appearance when compare with the prior CT. IMPRESSION: CT cystography reveals a long segment of bladder discontinuity along the dome consistent with rupture. Passage of contrast material into the peritoneal cavity is seen. Retained thrombus within the bladder is noted. Electronically Signed   By: Alcide Clever M.D.   On: 01/30/2019 23:20   Dg Chest Portable 1 View  Result Date: 01/30/2019 CLINICAL DATA:  Chest pain EXAM: PORTABLE CHEST 1 VIEW COMPARISON:  11/03/2014 FINDINGS: Cardiac shadows within normal limits. Tortuosity of the thoracic aorta is identified somewhat accentuated by the portable technique and patient rotation. The lungs are clear. No acute bony abnormality is noted. IMPRESSION: No acute abnormality noted. Electronically Signed   By: Alcide Clever M.D.   On: 01/30/2019 19:55   Dg Abd Portable 1v  Result Date: 02/05/2019 CLINICAL DATA:  Postoperative ileus. Hx of recent repair of ruptured bladder. Pt denies abdominal pain and constipation, stating he has only had ice and water to eat and drink and doesn't expect to be able to have a bowel movement without food. EXAM: PORTABLE ABDOMEN - 1 VIEW  COMPARISON:  None. FINDINGS: There is a nasogastric tube with the tip projecting over the antrum of the stomach/pylorus. There is gaseous distension of small bowel and colon most consistent with a postoperative ileus. There is no evidence of pneumoperitoneum, portal venous gas or pneumatosis. There are no pathologic calcifications along the expected course of the ureters. The osseous structures are unremarkable. IMPRESSION: 1. Nasogastric tube with the tip projecting over the antrum of the stomach/pylorus. 2. Persistent gaseous distension of the small bowel and colon consistent with postoperative ileus. Electronically Signed   By: Elige Ko   On: 02/05/2019 13:35   Dg Abd Portable 1v  Result Date: 02/04/2019 CLINICAL DATA:  Ileus. EXAM: PORTABLE ABDOMEN - 1 VIEW COMPARISON:  One-view abdomen 02/01/2019 FINDINGS: Multiple dilated loops of small bowel are again noted. NG tube is in place. The stomach is decompressed. Overall bowel dilation is improved. IMPRESSION: 1.  Improving dilation of small bowel. Electronically Signed   By: Marin Robertshristopher  Mattern M.D.   On: 02/04/2019 08:35   Dg Abd Portable 1v  Result Date: 02/01/2019 CLINICAL DATA:  Abdominal distension. EXAM: PORTABLE ABDOMEN - 1 VIEW COMPARISON:  CT scan January 30, 2019 FINDINGS: Lung bases are normal. No free air, portal venous gas, or pneumatosis on the supine view. Dilated loops of small bowel are identified. There is also air seen within the ascending and transverse colon which are mildly prominent. The dilated small bowel loops measure up to 5.2 cm. The transverse colon measures 6.6 cm. No other acute abnormalities. IMPRESSION: 1. Dilated loops of small bowel and mildly prominent loops of colon. The small bowel appears to be dilated out of proportion to the colon suggesting early or partial small bowel obstruction. Given the amount of air in the colon, a developing ileus is also a possibility. Recommend clinical correlation. A CT scan could  further evaluate if clinically warranted. Otherwise, recommend attention on close follow-up. These results will be called to the ordering clinician or representative by the Radiologist Assistant, and communication documented in the PACS or zVision Dashboard. Electronically Signed   By: Gerome Samavid  Williams III M.D   On: 02/01/2019 08:18     CBC Recent Labs  Lab 01/30/19 1920  02/01/19 0848 02/02/19 0226 02/03/19 0459 02/05/19 1206 02/06/19 0440  WBC 6.7   < > 14.0* 10.9* 8.8 8.1 9.6  HGB 11.7*   < > 13.4 13.6 12.8* 12.2* 12.5*  HCT 35.9*   < > 40.3 40.8 39.7 37.2* 38.9*  PLT 176   < > 168 190 184 222 262  MCV 102.6*   < > 98.5 98.3 98.8 97.4 98.7  MCH 33.4   < > 32.8 32.8 31.8 31.9 31.7  MCHC 32.6   < > 33.3 33.3 32.2 32.8 32.1  RDW 13.7   < > 15.1 14.7 13.9 13.3 13.3  LYMPHSABS 1.4  --   --   --   --   --   --   MONOABS 0.9  --   --   --   --   --   --   EOSABS 0.1  --   --   --   --   --   --   BASOSABS 0.0  --   --   --   --   --   --    < > = values in this interval not displayed.    Chemistries  Recent Labs  Lab 01/30/19 1920  02/01/19 0848 02/02/19 0226 02/03/19 0459 02/05/19 1206 02/06/19 0440  NA 134*   < > 134* 137 135 135 135  K 6.1*   < > 4.8 4.5 4.2 4.7 4.9  CL 101   < > 102 102 100 99 97*  CO2 21*   < > 25 24 24 26 28   GLUCOSE 120*   < > 159* 134* 116* 136* 129*  BUN 64*   < > 20 17 17 14 13   CREATININE 7.76*   < > 1.48* 1.11 1.00 0.96 1.11  CALCIUM 9.4   < > 9.0 8.8* 8.6* 9.1 9.2  AST 36  --   --   --   --   --   --   ALT 30  --   --   --   --   --   --   ALKPHOS 65  --   --   --   --   --   --  BILITOT 1.0  --   --   --   --   --   --    < > = values in this interval not displayed.   ------------------------------------------------------------------------------------------------------------------ No results for input(s): CHOL, HDL, LDLCALC, TRIG, CHOLHDL, LDLDIRECT in the last 72 hours.  No results found for:  HGBA1C ------------------------------------------------------------------------------------------------------------------ No results for input(s): TSH, T4TOTAL, T3FREE, THYROIDAB in the last 72 hours.  Invalid input(s): FREET3 ------------------------------------------------------------------------------------------------------------------ No results for input(s): VITAMINB12, FOLATE, FERRITIN, TIBC, IRON, RETICCTPCT in the last 72 hours.  Coagulation profile Recent Labs  Lab 01/30/19 1920  INR 1.0    No results for input(s): DDIMER in the last 72 hours.  Cardiac Enzymes Recent Labs  Lab 01/30/19 1920  TROPONINI <0.03   ------------------------------------------------------------------------------------------------------------------ No results found for: BNP   Shon Hale M.D on 02/06/2019 at 4:26 PM  Go to [email or web address] - for contact info  [facility] - Office  [phone]

## 2019-02-06 NOTE — Progress Notes (Signed)
[facility]/Trauma Progress Note  7 Days Post-Op   Assessment/Plan  Bladder rupture - s/p Exploratory laparotomy with repair of bladder rupture. 22Fr foley. NG, JP, Dr. Annabell HowellsWrenn, 04/13  Postop ileus - AXR 04/18 showed gaseous distention of sm bowel and colon - NGT, IVF until return of bowel function - NGT output is high but pt is eating ice chips - electrolytes are WNL - ambulate  - we will follow     LOS: 7 days    Subjective: CC: sore throat  No flatus today. He would like to walk. No issues overnight. No abdominal pain.  Objective: Vital signs in last 24 hours: Temp:  [97.9 F (36.6 C)-98.8 F (37.1 C)] 97.9 F (36.6 C) (04/19 0804) Pulse Rate:  [94-110] 98 (04/19 0804) Resp:  [17-20] 18 (04/19 0804) BP: (109-124)/(70-82) 116/82 (04/19 0804) SpO2:  [92 %-98 %] 95 % (04/19 0806) Last BM Date: 02/02/19  Intake/Output from previous day: 04/18 0701 - 04/19 0700 In: -  Out: 2850 [Emesis/NG output:2850] Intake/Output this shift: No intake/output data recorded.  PE: Gen:  Alert, NAD, pleasant, cooperative HEENT: NGT with thin/watery brown output Pulm:  Rate and effort normal Abd: Soft, NT, mild distention, no BS appreciated Skin: no rashes noted, warm and dry   Anti-infectives: Anti-infectives (From admission, onward)   Start     Dose/Rate Route Frequency Ordered Stop   02/01/19 0100  cefTRIAXone (ROCEPHIN) 2 g in sodium chloride 0.9 % 100 mL IVPB  Status:  Discontinued     2 g 200 mL/hr over 30 Minutes Intravenous Every 24 hours 01/31/19 0223 02/03/19 0855   01/31/19 0030  cefTRIAXone (ROCEPHIN) 2 g in sodium chloride 0.9 % 100 mL IVPB     2 g 200 mL/hr over 30 Minutes Intravenous  Once 01/31/19 0015 01/31/19 0240      Lab Results:  Recent Labs    02/05/19 1206 02/06/19 0440  WBC 8.1 9.6  HGB 12.2* 12.5*  HCT 37.2* 38.9*  PLT 222 262   BMET Recent Labs    02/05/19 1206 02/06/19 0440  NA 135 135  K 4.7 4.9  CL 99 97*  CO2 26 28   GLUCOSE 136* 129*  BUN 14 13  CREATININE 0.96 1.11  CALCIUM 9.1 9.2   PT/INR No results for input(s): LABPROT, INR in the last 72 hours. CMP     Component Value Date/Time   NA 135 02/06/2019 0440   K 4.9 02/06/2019 0440   CL 97 (L) 02/06/2019 0440   CO2 28 02/06/2019 0440   GLUCOSE 129 (H) 02/06/2019 0440   BUN 13 02/06/2019 0440   CREATININE 1.11 02/06/2019 0440   CALCIUM 9.2 02/06/2019 0440   PROT 6.5 01/30/2019 1920   ALBUMIN 4.2 01/30/2019 1920   AST 36 01/30/2019 1920   ALT 30 01/30/2019 1920   ALKPHOS 65 01/30/2019 1920   BILITOT 1.0 01/30/2019 1920   GFRNONAA >60 02/06/2019 0440   GFRAA >60 02/06/2019 0440   Lipase  No results found for: LIPASE  Studies/Results: Ct Abdomen Pelvis Wo Contrast  Result Date: 02/04/2019 CLINICAL DATA:  Bowel obstruction versus ileus. Bladder rupture on Sunday. Laparotomy 01/30/2019 with bladder repair. EXAM: CT ABDOMEN AND PELVIS WITHOUT CONTRAST TECHNIQUE: Multidetector CT imaging of the abdomen and pelvis was performed following the standard protocol without IV contrast. COMPARISON:  Plain films 02/04/2019.  Most recent CT 01/30/2019 FINDINGS: Lower chest: Right base subsegmental atelectasis. Normal heart size without pericardial or pleural effusion. Multivessel coronary artery atherosclerosis. Hepatobiliary:  Mild hepatic steatosis. Normal gallbladder, without biliary ductal dilatation. Pancreas: Normal, without mass or ductal dilatation. Spleen: Normal in size, without focal abnormality. Adrenals/Urinary Tract: Normal adrenal glands. 3 mm interpolar right renal collecting system calculus. No hydronephrosis. Foley catheter within the urinary bladder. Stomach/Bowel: Nasogastric tube terminates at the gastric body. The colon is normal in caliber. The terminal ileum is normal, including on image 60/3. Proximal and mid small bowel loops are dilated, including at up to 5.6 cm on image 50/3. The mid to distal ileum undergoes a gradual transition to  normal caliber, without focal abnormality. No findings to suggest ischemia. Vascular/Lymphatic: Aortic and branch vessel atherosclerosis. No abdominopelvic adenopathy. Reproductive: Mild prostatomegaly. Other: A surgical drain from a left pelvic approach terminates in the cul-de-sac. No residual abdominopelvic fluid. No free intraperitoneal air. Musculoskeletal: Moderate left-sided gynecomastia. Lumbosacral spondylosis. IMPRESSION: 1. Relatively diffuse small bowel dilatation, without focal transition point. Findings are most consistent with postoperative adynamic ileus. 2. Right nephrolithiasis. 3.  Aortic Atherosclerosis (ICD10-I70.0). 4. Hepatic steatosis. 5. Left gynecomastia. Electronically Signed   By: Jeronimo Greaves M.D.   On: 02/04/2019 14:41   Dg Abd Portable 1v  Result Date: 02/05/2019 CLINICAL DATA:  Postoperative ileus. Hx of recent repair of ruptured bladder. Pt denies abdominal pain and constipation, stating he has only had ice and water to eat and drink and doesn't expect to be able to have a bowel movement without food. EXAM: PORTABLE ABDOMEN - 1 VIEW COMPARISON:  None. FINDINGS: There is a nasogastric tube with the tip projecting over the antrum of the stomach/pylorus. There is gaseous distension of small bowel and colon most consistent with a postoperative ileus. There is no evidence of pneumoperitoneum, portal venous gas or pneumatosis. There are no pathologic calcifications along the expected course of the ureters. The osseous structures are unremarkable. IMPRESSION: 1. Nasogastric tube with the tip projecting over the antrum of the stomach/pylorus. 2. Persistent gaseous distension of the small bowel and colon consistent with postoperative ileus. Electronically Signed   By: Elige Ko   On: 02/05/2019 13:35      Jerre Simon , [facility] 02/06/2019, 9:21 AM  Pager: [phone] Mon-Wed, Friday 7:00am-4:30pm Thurs 7am-11:30am  Consults: [phone]

## 2019-02-07 ENCOUNTER — Inpatient Hospital Stay (HOSPITAL_COMMUNITY): Payer: BLUE CROSS/BLUE SHIELD

## 2019-02-07 ENCOUNTER — Inpatient Hospital Stay: Payer: Self-pay

## 2019-02-07 LAB — GLUCOSE, CAPILLARY
Glucose-Capillary: 119 mg/dL — ABNORMAL HIGH (ref 70–99)
Glucose-Capillary: 151 mg/dL — ABNORMAL HIGH (ref 70–99)
Glucose-Capillary: 119 mg/dL — ABNORMAL HIGH (ref 70–99)

## 2019-02-07 MED ORDER — SODIUM CHLORIDE 0.9% FLUSH: 10.0000 mL | INTRAVENOUS | Status: DC | PRN

## 2019-02-07 MED ORDER — BISACODYL 10 MG RE SUPP
10.0000 mg | Freq: Once | RECTAL | Status: AC
  Administered 2019-02-07: 10 mg via RECTAL
  Filled 2019-02-07: qty 1

## 2019-02-07 MED ORDER — SODIUM CHLORIDE 0.9% FLUSH
10.0000 mL | Freq: Two times a day (BID) | INTRAVENOUS | Status: DC
  Administered 2019-02-07 – 2019-02-12 (×5): 10 mL
  Administered 2019-02-12 – 2019-02-13 (×2): 20 mL

## 2019-02-07 MED ORDER — INSULIN ASPART 100 UNIT/ML ~~LOC~~ SOLN
0.0000 [IU] | SUBCUTANEOUS | Status: DC
  Administered 2019-02-07: 3 [IU] via SUBCUTANEOUS
  Administered 2019-02-08 – 2019-02-11 (×13): 2 [IU] via SUBCUTANEOUS

## 2019-02-07 MED ORDER — TRAVASOL 10 % IV SOLN
INTRAVENOUS | Status: DC
  Administered 2019-02-07: 18:00:00 via INTRAVENOUS
  Filled 2019-02-07: qty 508.8

## 2019-02-07 MED ORDER — DEXTROSE-NACL 5-0.9 % IV SOLN
INTRAVENOUS | Status: DC
  Administered 2019-02-07 – 2019-02-08 (×2): via INTRAVENOUS

## 2019-02-07 NOTE — Progress Notes (Signed)
Peripherally Inserted Central Catheter/Midline Placement  The IV Nurse has discussed with the patient and/or persons authorized to consent for the patient, the purpose of this procedure and the potential benefits and risks involved with this procedure.  The benefits include less needle sticks, lab draws from the catheter, and the patient may be discharged home with the catheter. Risks include, but not limited to, infection, bleeding, blood clot (thrombus formation), and puncture of an artery; nerve damage and irregular heartbeat and possibility to perform a PICC exchange if needed/ordered by physician.  Alternatives to this procedure were also discussed.  [employer] PICC patient education guide, fact sheet on infection prevention and patient information card has been provided to patient /or left at bedside.    PICC/Midline Placement Documentation  PICC Double Lumen 02/07/19 PICC Right Brachial 38 cm 0 cm (Active)  Indication for Insertion or Continuance of Line Administration of hyperosmolar/irritating solutions (i.e. TPN, Vancomycin, etc.) 02/07/2019  9:43 AM  Exposed Catheter (cm) 0 cm 02/07/2019  9:43 AM  Site Assessment Clean;Dry;Intact 02/07/2019  9:43 AM  Lumen #1 Status Flushed;Blood return noted 02/07/2019  9:43 AM  Lumen #2 Status Flushed;Blood return noted 02/07/2019  9:43 AM  Dressing Type Transparent 02/07/2019  9:43 AM  Dressing Status Clean;Dry;Intact;Antimicrobial disc in place 02/07/2019  9:43 AM  Dressing Intervention New dressing 02/07/2019  9:43 AM  Dressing Change Due 02/14/19 02/07/2019  9:43 AM       Reginia Forts Albarece 02/07/2019, 9:44 AM

## 2019-02-07 NOTE — Progress Notes (Signed)
8 Days Post-Op  Subjective: Caleb Potter is POD #8 from X-lap with repair of bladder rupture.  He has had a prolonged ileus with continued high NG output yesterday.   UOP remains good.  He has no nausea with NG and minimal pain.  He has passed flatus but has had no recent BM.  He has no associated signs or symptoms but reports he wasn't assisted with ambulation yesterday.    ROS:  Review of Systems  All other systems reviewed and are negative.   Anti-infectives: Anti-infectives (From admission, onward)   Start     Dose/Rate Route Frequency Ordered Stop   02/01/19 0100  cefTRIAXone (ROCEPHIN) 2 g in sodium chloride 0.9 % 100 mL IVPB  Status:  Discontinued     2 g 200 mL/hr over 30 Minutes Intravenous Every 24 hours 01/31/19 0223 02/03/19 0855   01/31/19 0030  cefTRIAXone (ROCEPHIN) 2 g in sodium chloride 0.9 % 100 mL IVPB     2 g 200 mL/hr over 30 Minutes Intravenous  Once 01/31/19 0015 01/31/19 0240      Current Facility-Administered Medications  Medication Dose Route Frequency Provider Last Rate Last Dose  . acetaminophen (TYLENOL) tablet 650 mg  650 mg Oral Q6H PRN Opyd, Lavone Neriimothy S, MD       Or  . acetaminophen (TYLENOL) suppository 650 mg  650 mg Rectal Q6H PRN Opyd, Lavone Neriimothy S, MD      . bisacodyl (DULCOLAX) suppository 10 mg  10 mg Rectal Daily PRN Bjorn PippinWrenn, Rubens Cranston, MD   10 mg at 02/02/19 0747  . dextrose 5 %-0.9 % sodium chloride infusion   Intravenous Continuous Jerilee FieldEskridge, Matthew, MD 125 mL/hr at 02/06/19 1035    . diphenhydrAMINE (BENADRYL) injection 12.5-25 mg  12.5-25 mg Intravenous Q6H PRN Bjorn PippinWrenn, Marinna Blane, MD       Or  . diphenhydrAMINE (BENADRYL) 12.5 MG/5ML elixir 12.5-25 mg  12.5-25 mg Oral Q6H PRN Bjorn PippinWrenn, Harshith Pursell, MD      . finasteride (PROSCAR) tablet 5 mg  5 mg Oral Daily Bjorn PippinWrenn, Io Dieujuste, MD   5 mg at 02/04/19 1105  . fluticasone furoate-vilanterol (BREO ELLIPTA) 100-25 MCG/INH 1 puff  1 puff Inhalation Daily Bjorn PippinWrenn, Daris Aristizabal, MD   1 puff at 02/06/19 0805  . folic acid injection 1 mg  1 mg  Intravenous Daily Hazeline JunkerGrunz, Ryan B, MD   1 mg at 02/06/19 [id number]  . heparin injection 5,000 Units  5,000 Units Subcutaneous Q8H Bjorn PippinWrenn, Bettyann Birchler, MD   5,000 Units at 02/04/19 [phone]  . HYDROcodone-acetaminophen (NORCO/VICODIN) 5-325 MG per tablet 1-2 tablet  1-2 tablet Oral Q4H PRN Opyd, Lavone Neriimothy S, MD      . HYDROmorphone (DILAUDID) injection 0.5-1 mg  0.5-1 mg Intravenous Q2H PRN Bjorn PippinWrenn, Anglia Blakley, MD   1 mg at 02/02/19 2104  . lactated ringers infusion   Intravenous Continuous Maurilio LovelyShah, Pratik D, DO 100 mL/hr at 02/03/19 0309    . LORazepam (ATIVAN) injection 1 mg  1 mg Intravenous Q6H PRN Emokpae, Courage, MD      . menthol-cetylpyridinium (CEPACOL) lozenge 3 mg  1 lozenge Oral PRN Focht, Jessica L, PA      . metoprolol tartrate (LOPRESSOR) injection 5 mg  5 mg Intravenous Q6H Emokpae, Courage, MD   5 mg at 02/07/19 0605  . morphine 4 MG/ML injection 4 mg  4 mg Intravenous Q4H PRN Opyd, Lavone Neriimothy S, MD      . ondansetron (ZOFRAN) tablet 4 mg  4 mg Oral Q6H PRN Opyd, Lavone Neriimothy S, MD  Or  . ondansetron (ZOFRAN) injection 4 mg  4 mg Intravenous Q6H PRN Opyd, Lavone Neri, MD   4 mg at 02/01/19 1237  . oxybutynin (DITROPAN) tablet 5 mg  5 mg Oral Q8H PRN Bjorn Pippin, MD      . oxyCODONE (Oxy IR/ROXICODONE) immediate release tablet 5 mg  5 mg Oral Q4H PRN Bjorn Pippin, MD      . promethazine (PHENERGAN) injection 12.5 mg  12.5 mg Intravenous Q6H PRN Bjorn Pippin, MD   12.5 mg at 02/02/19 0206  . senna-docusate (Senokot-S) tablet 1 tablet  1 tablet Oral QHS PRN Bjorn Pippin, MD      . sodium phosphate (FLEET) 7-19 GM/118ML enema 1 enema  1 enema Rectal Once PRN Bjorn Pippin, MD      . thiamine (B-1) injection 100 mg  100 mg Intravenous Daily Hazeline Junker B, MD   100 mg at 02/06/19 1308  . umeclidinium bromide (INCRUSE ELLIPTA) 62.5 MCG/INH 1 puff  1 puff Inhalation Daily Bjorn Pippin, MD   1 puff at 02/06/19 0806  . zolpidem (AMBIEN) tablet 5 mg  5 mg Oral QHS PRN,MR X 1 Bjorn Pippin, MD         Objective: Vital signs in last  24 hours: Temp:  [97.5 F (36.4 C)-99.5 F (37.5 C)] 99.5 F (37.5 C) (04/20 0300) Pulse Rate:  [96-113] 111 (04/20 0300) Resp:  [18-20] 18 (04/20 0300) BP: (102-126)/(74-82) 112/74 (04/20 0300) SpO2:  [91 %-98 %] 93 % (04/20 0300)  Intake/Output from previous day: 04/19 0701 - 04/20 0700 In: -  Out: 7200 [Urine:3200; Emesis/NG output:4000] Intake/Output this shift: No intake/output data recorded.   Physical Exam Vitals signs reviewed.  Constitutional:      Appearance: Normal appearance. He is obese.  Neck:     Musculoskeletal: Normal range of motion and neck supple.  Cardiovascular:     Rate and Rhythm: Regular rhythm. Tachycardia present.  Pulmonary:     Effort: No respiratory distress.     Breath sounds: Normal breath sounds.  Abdominal:     General: There is distension (but improving.  BS remain quiet.  Wound is intact without erythema.).  Musculoskeletal: Normal range of motion.        General: No swelling or tenderness.  Skin:    General: Skin is warm and dry.  Neurological:     General: No focal deficit present.     Mental Status: He is alert and oriented to person, place, and time.  Psychiatric:        Mood and Affect: Mood normal.        Behavior: Behavior normal.     Lab Results:  Recent Labs    02/05/19 1206 02/06/19 0440  WBC 8.1 9.6  HGB 12.2* 12.5*  HCT 37.2* 38.9*  PLT 222 262   BMET Recent Labs    02/05/19 1206 02/06/19 0440  NA 135 135  K 4.7 4.9  CL 99 97*  CO2 26 28  GLUCOSE 136* 129*  BUN 14 13  CREATININE 0.96 1.11  CALCIUM 9.1 9.2   PT/INR No results for input(s): LABPROT, INR in the last 72 hours. ABG No results for input(s): PHART, HCO3 in the last 72 hours.  Invalid input(s): PCO2, PO2  Studies/Results: Dg Abd Portable 1v  Result Date: 02/05/2019 CLINICAL DATA:  Postoperative ileus. Hx of recent repair of ruptured bladder. Pt denies abdominal pain and constipation, stating he has only had ice and water to eat and  drink and  doesn't expect to be able to have a bowel movement without food. EXAM: PORTABLE ABDOMEN - 1 VIEW COMPARISON:  None. FINDINGS: There is a nasogastric tube with the tip projecting over the antrum of the stomach/pylorus. There is gaseous distension of small bowel and colon most consistent with a postoperative ileus. There is no evidence of pneumoperitoneum, portal venous gas or pneumatosis. There are no pathologic calcifications along the expected course of the ureters. The osseous structures are unremarkable. IMPRESSION: 1. Nasogastric tube with the tip projecting over the antrum of the stomach/pylorus. 2. Persistent gaseous distension of the small bowel and colon consistent with postoperative ileus. Electronically Signed   By: Elige Ko   On: 02/05/2019 13:35   [place] Ekg Site Rite  Result Date: 02/07/2019 If Site Rite image not attached, placement could not be confirmed due to current cardiac rhythm.  Last labs reviewed.  Cr 1.1   Hgb 12.5  WBC  9.6K  Assessment and Plan:  POD #8 Exploratory laparotomy with repair of bladder rupture.22Fr foley. NG, JP. --Minimal JP drainage.  Will D/C today. -Continued ileus- Will order PICC/TPN and repeat abd film--Increase IVF rate to 125 cc/hr --Labs per TPN protocol.    LOS: 8 days    Bjorn Pippin 02/07/2019 [phone] ID: Caleb Potter, male   DOB: Sep 09, 1961, 58 y.o.   MRN: [id number]

## 2019-02-07 NOTE — Progress Notes (Signed)
Patient Demographics:    Caleb Potter, is a 58 y.o. male, DOB - 01/12/1961, MCR:[id number]  Admit date - 01/30/2019   Admitting Physician Bjorn Pippin, MD  Outpatient Primary MD for the patient is [name], MD  LOS - 8   Chief Complaint  Patient presents with   Near Syncope   Fall        Subjective:    Caleb Potter today has no fevers,   No chest pain,  ... NG drainage continues to be good amount,  IV TPN pending return of bowel function  Assessment  & Plan :    Principal Problem:   Intraperitoneal rupture of bladder Active Problems:   Intraperitoneal hematoma   Hypertension   Coronary artery disease   AKI (acute kidney injury) (HCC)   Hyperkalemia   Anxiety   Brief Summary Caleb Potter is a 58 y.o. male with a history of HTN, CAD, and nephrolithiasis who presented to the [facility] ED with abdominal pain found to have intraperitoneal fluid, suspected hemorrhage on CT abd/pelvis with subsequent CT pyelogram demonstrating ruptured bladder and hyperkalemic AKI with creatinine 7.7, K 6.1. Urology performed ex lap with bladder repair 4/13. IV fluids, lokelma, bicarbonate, and calcium gluconate were provided as well. Postoperatively renal function and electrolytes have normalized, though he has an ileus vs. partial SBO with NG tube placed. Abdominal XR and CT abdomen and pelvis with contrast shows persistent dilated loops of small bowel.  IV TPN pending return of bowel function  A/p 1)Ileus vs. pSBO:  -  Very high output from NG tube, Patient remains n.p.o., surgical consult from Dr. Derrell Lolling appreciated continue conservative management at this time for presumed postoperative ileus--electrolytes are within normal limits,  IV TPN pending return of bowel function--x-ray from 02/07/2019 showed air contrast in the colon.. No significant abdominal pain, however no BM and no significant flatus  2) AKI:  Due to BOO and uremic labs due to reabsorption of uroperitoneum. Resolved.  -  IV TPN pending return of bowel function,  NPO -Continue to hold ACEi for now,  Avoid nephrotoxins  3)Alcohol abuse: Denies history of dependence or withdrawal. Last drink was at least 4 days PTA, no risk for DTs-May change from IV to oral folic acid and thiamine when oral intake resumes-    4)HTN--stable, continue IV metoprolol with parameters, continue to avoid ACEI  5)COPD: -No acute exacerbations, use bronchodilators as ordered  6)H/o CAD--no ACS type symptoms at that time, IV metoprolol as ordered, once oral intake resumes will restart aspirin and statin  7)Bladder rupture: s/p ex lap, repair 4/13.  - Per urology. - Abx per urology, currently on CTX. WBC normalized on 4/16.   8)Acute blood loss anemia: Mild, stable, hemoglobin above 12 , no ongoing bleeding   9)FEN--patient is n.p.o. due to postop ileus, continue IV TPN until oral intake resumes  Disposition/Need for in-Hospital Stay- patient unable to be discharged at this time due to persistent postop ileus with inability to take oral intake patient needs IV fluids while n.p.o..... Currently on IV TPN pending return of bowel function  Code Status : full  Family Communication:   na   Disposition Plan  : TBD  Consults  : Urology is primary team, general  surgery was consulted  DVT Prophylaxis  :    Heparin - SCDs   Lab Results  Component Value Date   PLT 262 02/06/2019    Inpatient Medications  Scheduled Meds:  finasteride  5 mg Oral Daily   fluticasone furoate-vilanterol  1 puff Inhalation Daily   heparin injection (subcutaneous)  5,000 Units Subcutaneous Q8H   insulin aspart  0-15 Units Subcutaneous Q4H   metoprolol tartrate  5 mg Intravenous Q6H   sodium chloride flush  10-40 mL Intracatheter Q12H   umeclidinium bromide  1 puff Inhalation Daily   Continuous Infusions:  dextrose 5 % and 0.9% NaCl 85 mL/hr at 02/07/19 1837    lactated ringers 100 mL/hr at 02/03/19 0309   TPN ADULT (ION) 40 mL/hr at 02/07/19 1743   PRN Meds:.acetaminophen **OR** acetaminophen, diphenhydrAMINE **OR** diphenhydrAMINE, HYDROcodone-acetaminophen, HYDROmorphone (DILAUDID) injection, LORazepam, menthol-cetylpyridinium, morphine injection, ondansetron **OR** ondansetron (ZOFRAN) IV, oxybutynin, oxyCODONE, promethazine, senna-docusate, sodium chloride flush, sodium phosphate, zolpidem    Anti-infectives (From admission, onward)   Start     Dose/Rate Route Frequency Ordered Stop   02/01/19 0100  cefTRIAXone (ROCEPHIN) 2 g in sodium chloride 0.9 % 100 mL IVPB  Status:  Discontinued     2 g 200 mL/hr over 30 Minutes Intravenous Every 24 hours 01/31/19 0223 02/03/19 0855   01/31/19 0030  cefTRIAXone (ROCEPHIN) 2 g in sodium chloride 0.9 % 100 mL IVPB     2 g 200 mL/hr over 30 Minutes Intravenous  Once 01/31/19 0015 01/31/19 0240        Objective:   Vitals:   02/07/19 0808 02/07/19 1223 02/07/19 1550 02/07/19 1642  BP: 109/63 117/83 109/80   Pulse: 99 98 (!) 127   Resp: Temp: 97.8 F (36.6 C) (!) 97.2 F (36.2 C) 98 F (36.7 C)   TempSrc: Oral Oral Oral   SpO2: 98% 94% 92%   Weight:    86.2 kg  Height:        Wt Readings from Last 3 Encounters:  02/07/19 86.2 kg  11/07/14 93.4 kg  11/03/14 93.4 kg     Intake/Output Summary (Last 24 hours) at 02/07/2019 1917 Last data filed at 02/07/2019 1600 Gross per 24 hour  Intake 480 ml  Output 5600 ml  Net -5120 ml     Physical Exam Patient is examined daily including today on 02/07/19 , exams remain the same as of yesterday except that has changed   Gen:- Awake Alert,  In no apparent distress  HEENT:- [place].AT, No sclera icterus  Nose-NG tube with gastric contents Neck-Supple Neck,No JVD,.   Lungs-  CTAB , fair symmetrical air movement CV- S1, S2 normal, regular  Abd-abdomen is less tense, distended, diminished bowel sounds, tender without rebound or guarding,  postop wound is clean dry and intact, JP drain with scant drainage to be removed Extremity/Skin:- No  edema, pedal pulses present , PICC line in situ Psych-affect is appropriate, oriented x3 Neuro-no new focal deficits, no tremors GU--Foley with clear urine   Data Review:   Micro Results Recent Results (from the past 240 hour(s))  MRSA PCR Screening     Status: None   Collection Time: 01/31/19  2:40 AM  Result Value Ref Range Status   MRSA by PCR NEGATIVE NEGATIVE Final    Comment:        The GeneXpert MRSA Assay (FDA approved for NASAL specimens only), is one component of a comprehensive MRSA colonization surveillance program. It is not  intended to diagnose MRSA infection nor to guide or monitor treatment for MRSA infections. Performed at [facility], [address], [place], [place] [address]     Radiology Reports Ct Abdomen Pelvis Wo Contrast  Result Date: 02/04/2019 CLINICAL DATA:  Bowel obstruction versus ileus. Bladder rupture on Sunday. Laparotomy 01/30/2019 with bladder repair. EXAM: CT ABDOMEN AND PELVIS WITHOUT CONTRAST TECHNIQUE: Multidetector CT imaging of the abdomen and pelvis was performed following the standard protocol without IV contrast. COMPARISON:  Plain films 02/04/2019.  Most recent CT 01/30/2019 FINDINGS: Lower chest: Right base subsegmental atelectasis. Normal heart size without pericardial or pleural effusion. Multivessel coronary artery atherosclerosis. Hepatobiliary: Mild hepatic steatosis. Normal gallbladder, without biliary ductal dilatation. Pancreas: Normal, without mass or ductal dilatation. Spleen: Normal in size, without focal abnormality. Adrenals/Urinary Tract: Normal adrenal glands. 3 mm interpolar right renal collecting system calculus. No hydronephrosis. Foley catheter within the urinary bladder. Stomach/Bowel: Nasogastric tube terminates at the gastric body. The colon is normal in caliber. The terminal ileum is normal, including on image  60/3. Proximal and mid small bowel loops are dilated, including at up to 5.6 cm on image 50/3. The mid to distal ileum undergoes a gradual transition to normal caliber, without focal abnormality. No findings to suggest ischemia. Vascular/Lymphatic: Aortic and branch vessel atherosclerosis. No abdominopelvic adenopathy. Reproductive: Mild prostatomegaly. Other: A surgical drain from a left pelvic approach terminates in the cul-de-sac. No residual abdominopelvic fluid. No free intraperitoneal air. Musculoskeletal: Moderate left-sided gynecomastia. Lumbosacral spondylosis. IMPRESSION: 1. Relatively diffuse small bowel dilatation, without focal transition point. Findings are most consistent with postoperative adynamic ileus. 2. Right nephrolithiasis. 3.  Aortic Atherosclerosis (ICD10-I70.0). 4. Hepatic steatosis. 5. Left gynecomastia. Electronically Signed   By: Jeronimo Greaves M.D.   On: 02/04/2019 14:41   Ct Abdomen Pelvis Wo Contrast  Result Date: 01/30/2019 CLINICAL DATA:  Gross hematuria.  Bilateral flank pain after fall. EXAM: CT ABDOMEN AND PELVIS WITHOUT CONTRAST TECHNIQUE: Multidetector CT imaging of the abdomen and pelvis was performed following the standard protocol without IV contrast. COMPARISON:  CT scan of January 26, 2019. FINDINGS: Lower chest: No acute abnormality. Hepatobiliary: No gallstones or biliary dilatation is noted. Mild amount of fluid is noted around the liver with dense material seen posteriorly consistent with hematoma. Pancreas: Unremarkable. No pancreatic ductal dilatation or surrounding inflammatory changes. Spleen: Fluid is noted around the spleen which was not present on prior exam and is concerning for hemorrhage. Adrenals/Urinary Tract: Adrenal glands appear normal. Nonobstructive right renal calculus is noted. No hydronephrosis or renal obstruction is noted. The amount of hemorrhage seen in urinary bladder on prior exam is significantly decreased, although residual hemorrhage does  remain in the dependent portion of the bladder. Stomach/Bowel: The stomach appears normal. There is no evidence of bowel obstruction or inflammation. The appendix is not clearly visualized. Vascular/Lymphatic: Aortic atherosclerosis. No enlarged abdominal or pelvic lymph nodes. Reproductive: Stable mild prostatic enlargement is noted. Other: There does appear to be a moderate amount of hemorrhage present in the dependent portion of the pelvis as well as in the left pericolic gutter. Musculoskeletal: No acute or significant osseous findings. IMPRESSION: There is interval development of a moderate amount of intraperitoneal hemorrhage seen in the pelvis, both pericolic gutters, and around the liver and spleen. Critical Value/emergent results were called by telephone at the time of interpretation on 01/30/2019 at 8:29 pm to Dr. Terance Hart , who verbally acknowledged these results. The large amount of hemorrhage seen within the urinary bladder on prior  exam has significantly improved, with only a mild amount of residual hemorrhage or thrombus seen posteriorly in the urinary bladder on current exam. Nonobstructive right renal calculus. No hydronephrosis or renal obstruction is noted. Stable mild prostatic enlargement. Electronically Signed   By: Lupita Raider, M.D.   On: 01/30/2019 20:30   Dg Abd 1 View  Result Date: 02/01/2019 CLINICAL DATA:  Enteric tube placement EXAM: ABDOMEN - 1 VIEW COMPARISON:  None. FINDINGS: The tip of the enteric tube is at the expected location of the gastroduodenal junction or first portion of the duodenum. The side port is still likely within the stomach. There are multiple dilated loops of bowel throughout the abdomen. IMPRESSION: Enteric tube tip near the gastroduodenal junction. Electronically Signed   By: Deatra Robinson M.D.   On: 02/01/2019 14:09   Ct Pelvis Wo Contrast  Result Date: 01/30/2019 CLINICAL DATA:  Considerable free fluid within the abdomen on recent CT, request is  been made for CT cystography. EXAM: CT PELVIS WITHOUT CONTRAST TECHNIQUE: Multidetector CT imaging of the pelvis was performed following the standard protocol without intravenous contrast. COMPARISON:  CT from earlier in the same day. FINDINGS: Urinary Tract: Contrast material is been instilled through the Foley catheter. The Foley catheter balloon is well visualized. The bladder distends well and demonstrates diffuse intraluminal filling defect consistent with residual thrombus. Additionally there is extravasation of contrast material from the dome of the bladder in a linear fashion which extends for approximately 2.9 cm consistent with bladder rupture. Contrast enhancement of the intraperitoneal fluid seen on the recent CT examination consistent with the extravasation. Bowel:  Bowel is again within normal limits. Vascular/Lymphatic: Atherosclerotic calcifications are seen. No lymphadenopathy is noted. Reproductive: Prostate is mildly prominent but stable from the recent exam. Other: Free fluid is again noted within the abdomen and pelvis related to the bladder perforation. Musculoskeletal: Stable in appearance when compare with the prior CT. IMPRESSION: CT cystography reveals a long segment of bladder discontinuity along the dome consistent with rupture. Passage of contrast material into the peritoneal cavity is seen. Retained thrombus within the bladder is noted. Electronically Signed   By: Alcide Clever M.D.   On: 01/30/2019 23:20   Dg Chest Portable 1 View  Result Date: 01/30/2019 CLINICAL DATA:  Chest pain EXAM: PORTABLE CHEST 1 VIEW COMPARISON:  11/03/2014 FINDINGS: Cardiac shadows within normal limits. Tortuosity of the thoracic aorta is identified somewhat accentuated by the portable technique and patient rotation. The lungs are clear. No acute bony abnormality is noted. IMPRESSION: No acute abnormality noted. Electronically Signed   By: Alcide Clever M.D.   On: 01/30/2019 19:55   Dg Abd Portable  1v  Result Date: 02/07/2019 CLINICAL DATA:  58 year old male with abdominal distension EXAM: PORTABLE ABDOMEN - 1 VIEW COMPARISON:  Prior abdominal radiographs 02/05/2019 FINDINGS: Persistent gaseous distension of small bowel throughout the abdomen. The maximal diameter is 5.7 cm. A gastric tube remains present. The tip overlies the gastric antrum. Previously ingested contrast material opacifies the colon. The colon is not distended. A surgical drain projects over the anatomic pelvis. A staple line is present. The lung bases are clear. IMPRESSION: 1. Persistent gaseous distension of small bowel in the mid abdomen. Contrast material is now present throughout the colon. Findings suggest partial small bowel obstruction versus postoperative small bowel ileus. 2. Well-positioned gastrostomy tube with the tip in the region of the gastric antrum. Electronically Signed   By: Malachy Moan M.D.   On: 02/07/2019 08:13  Dg Abd Portable 1v  Result Date: 02/05/2019 CLINICAL DATA:  Postoperative ileus. Hx of recent repair of ruptured bladder. Pt denies abdominal pain and constipation, stating he has only had ice and water to eat and drink and doesn't expect to be able to have a bowel movement without food. EXAM: PORTABLE ABDOMEN - 1 VIEW COMPARISON:  None. FINDINGS: There is a nasogastric tube with the tip projecting over the antrum of the stomach/pylorus. There is gaseous distension of small bowel and colon most consistent with a postoperative ileus. There is no evidence of pneumoperitoneum, portal venous gas or pneumatosis. There are no pathologic calcifications along the expected course of the ureters. The osseous structures are unremarkable. IMPRESSION: 1. Nasogastric tube with the tip projecting over the antrum of the stomach/pylorus. 2. Persistent gaseous distension of the small bowel and colon consistent with postoperative ileus. Electronically Signed   By: Elige KoHetal  Patel   On: 02/05/2019 13:35   Dg Abd Portable  1v  Result Date: 02/04/2019 CLINICAL DATA:  Ileus. EXAM: PORTABLE ABDOMEN - 1 VIEW COMPARISON:  One-view abdomen 02/01/2019 FINDINGS: Multiple dilated loops of small bowel are again noted. NG tube is in place. The stomach is decompressed. Overall bowel dilation is improved. IMPRESSION: 1. Improving dilation of small bowel. Electronically Signed   By: Marin Robertshristopher  Mattern M.D.   On: 02/04/2019 08:35   Dg Abd Portable 1v  Result Date: 02/01/2019 CLINICAL DATA:  Abdominal distension. EXAM: PORTABLE ABDOMEN - 1 VIEW COMPARISON:  CT scan January 30, 2019 FINDINGS: Lung bases are normal. No free air, portal venous gas, or pneumatosis on the supine view. Dilated loops of small bowel are identified. There is also air seen within the ascending and transverse colon which are mildly prominent. The dilated small bowel loops measure up to 5.2 cm. The transverse colon measures 6.6 cm. No other acute abnormalities. IMPRESSION: 1. Dilated loops of small bowel and mildly prominent loops of colon. The small bowel appears to be dilated out of proportion to the colon suggesting early or partial small bowel obstruction. Given the amount of air in the colon, a developing ileus is also a possibility. Recommend clinical correlation. A CT scan could further evaluate if clinically warranted. Otherwise, recommend attention on close follow-up. These results will be called to the ordering clinician or representative by the Radiologist Assistant, and communication documented in the PACS or zVision Dashboard. Electronically Signed   By: Gerome Samavid  Williams III M.D   On: 02/01/2019 08:18   [place] Ekg Site Rite  Result Date: 02/07/2019 If Site Rite image not attached, placement could not be confirmed due to current cardiac rhythm.    CBC Recent Labs  Lab 02/01/19 0848 02/02/19 0226 02/03/19 0459 02/05/19 1206 02/06/19 0440  WBC 14.0* 10.9* 8.8 8.1 9.6  HGB 13.4 13.6 12.8* 12.2* 12.5*  HCT 40.3 40.8 39.7 37.2* 38.9*  PLT 168 190 184  222 262  MCV 98.5 98.3 98.8 97.4 98.7  MCH 32.8 32.8 31.8 31.9 31.7  MCHC 33.3 33.3 32.2 32.8 32.1  RDW 15.1 14.7 13.9 13.3 13.3    Chemistries  Recent Labs  Lab 02/01/19 0848 02/02/19 0226 02/03/19 0459 02/05/19 1206 02/06/19 0440  NA 134* 137 135 135 135  K 4.8 4.5 4.2 4.7 4.9  CL 102 102 100 99 97*  CO2 25 24 24 26 28   GLUCOSE 159* 134* 116* 136* 129*  BUN 20 17 17 14 13   CREATININE 1.48* 1.11 1.00 0.96 1.11  CALCIUM 9.0 8.8* 8.6* 9.1 9.2   ------------------------------------------------------------------------------------------------------------------  No results for input(s): CHOL, HDL, LDLCALC, TRIG, CHOLHDL, LDLDIRECT in the last 72 hours.  No results found for: HGBA1C ------------------------------------------------------------------------------------------------------------------ No results for input(s): TSH, T4TOTAL, T3FREE, THYROIDAB in the last 72 hours.  Invalid input(s): FREET3 ------------------------------------------------------------------------------------------------------------------ No results for input(s): VITAMINB12, FOLATE, FERRITIN, TIBC, IRON, RETICCTPCT in the last 72 hours.  Coagulation profile No results for input(s): INR, PROTIME in the last 168 hours.  No results for input(s): DDIMER in the last 72 hours.  Cardiac Enzymes No results for input(s): CKMB, TROPONINI, MYOGLOBIN in the last 168 hours.  Invalid input(s): CK ------------------------------------------------------------------------------------------------------------------ No results found for: BNP   Shon Hale M.D on 02/07/2019 at 7:17 PM  Go to [email or web address] - for contact info  [facility] - Office  [phone]

## 2019-02-07 NOTE — Progress Notes (Signed)
8 Days Post-Op   Subjective/Chief Complaint: Patient reports a little flatus Plain films today - my review shows contrast, air in colon.  No obstructive signs No abdominal pain Patient complains of not being able to ambulate because he is hooked to NG   Objective: Vital signs in last 24 hours: Temp:  [97.5 F (36.4 C)-99.5 F (37.5 C)] 97.8 F (36.6 C) (04/20 0808) Pulse Rate:  [96-113] 99 (04/20 0808) Resp:  [18-20] 19 (04/20 0808) BP: (102-126)/(63-76) 109/63 (04/20 0808) SpO2:  [91 %-98 %] 98 % (04/20 0808) Last BM Date: 02/02/19  Intake/Output from previous day: 04/19 0701 - 04/20 0700 In: -  Out: 7200 [Urine:3200; Emesis/NG output:4000] Intake/Output this shift: No intake/output data recorded.  General appearance: alert, cooperative and no distress GI: soft, minimal tenderness, not distended  Lab Results:  Recent Labs    02/05/19 1206 02/06/19 0440  WBC 8.1 9.6  HGB 12.2* 12.5*  HCT 37.2* 38.9*  PLT 222 262   BMET Recent Labs    02/05/19 1206 02/06/19 0440  NA 135 135  K 4.7 4.9  CL 99 97*  CO2 26 28  GLUCOSE 136* 129*  BUN 14 13  CREATININE 0.96 1.11  CALCIUM 9.1 9.2   PT/INR No results for input(s): LABPROT, INR in the last 72 hours. ABG No results for input(s): PHART, HCO3 in the last 72 hours.  Invalid input(s): PCO2, PO2  Studies/Results: Dg Abd Portable 1v  Result Date: 02/07/2019 CLINICAL DATA:  58 year old male with abdominal distension EXAM: PORTABLE ABDOMEN - 1 VIEW COMPARISON:  Prior abdominal radiographs 02/05/2019 FINDINGS: Persistent gaseous distension of small bowel throughout the abdomen. The maximal diameter is 5.7 cm. A gastric tube remains present. The tip overlies the gastric antrum. Previously ingested contrast material opacifies the colon. The colon is not distended. A surgical drain projects over the anatomic pelvis. A staple line is present. The lung bases are clear. IMPRESSION: 1. Persistent gaseous distension of small  bowel in the mid abdomen. Contrast material is now present throughout the colon. Findings suggest partial small bowel obstruction versus postoperative small bowel ileus. 2. Well-positioned gastrostomy tube with the tip in the region of the gastric antrum. Electronically Signed   By: Malachy MoanHeath  McCullough M.D.   On: 02/07/2019 08:13   Dg Abd Portable 1v  Result Date: 02/05/2019 CLINICAL DATA:  Postoperative ileus. Hx of recent repair of ruptured bladder. Pt denies abdominal pain and constipation, stating he has only had ice and water to eat and drink and doesn't expect to be able to have a bowel movement without food. EXAM: PORTABLE ABDOMEN - 1 VIEW COMPARISON:  None. FINDINGS: There is a nasogastric tube with the tip projecting over the antrum of the stomach/pylorus. There is gaseous distension of small bowel and colon most consistent with a postoperative ileus. There is no evidence of pneumoperitoneum, portal venous gas or pneumatosis. There are no pathologic calcifications along the expected course of the ureters. The osseous structures are unremarkable. IMPRESSION: 1. Nasogastric tube with the tip projecting over the antrum of the stomach/pylorus. 2. Persistent gaseous distension of the small bowel and colon consistent with postoperative ileus. Electronically Signed   By: Elige KoHetal  Patel   On: 02/05/2019 13:35   [place] Ekg Site Rite  Result Date: 02/07/2019 If Site Rite image not attached, placement could not be confirmed due to current cardiac rhythm.   Anti-infectives: Anti-infectives (From admission, onward)   Start     Dose/Rate Route Frequency Ordered Stop   02/01/19 0100  cefTRIAXone (ROCEPHIN) 2 g in sodium chloride 0.9 % 100 mL IVPB  Status:  Discontinued     2 g 200 mL/hr over 30 Minutes Intravenous Every 24 hours 01/31/19 0223 02/03/19 0855   01/31/19 0030  cefTRIAXone (ROCEPHIN) 2 g in sodium chloride 0.9 % 100 mL IVPB     2 g 200 mL/hr over 30 Minutes Intravenous  Once 01/31/19 0015 01/31/19  0240      Assessment/Plan: Bladder rupture - s/p Exploratory laparotomy with repair of bladder rupture.22Fr foley. NG, JP, Dr. Annabell Howells, 04/13  Postop ileus - AXR 04/20 showed gaseous distention of sm bowel and colon - NGT, IVF until return of bowel function - clear liquids -Dulcolax suppository x 1 - encourage ambulation  LOS: 8 days    Caleb Potter 02/07/2019

## 2019-02-07 NOTE — Progress Notes (Signed)
PHARMACY - ADULT TOTAL PARENTERAL NUTRITION CONSULT NOTE   Pharmacy Consult for TPN Indication: Prolonged Ileus  Patient Measurements: Height: 5\' 6"  (167.6 cm) Weight: 220 lb 14.4 oz (100.2 kg) IBW/kg (Calculated) : 63.8 TPN AdjBW (KG): 72.9 Body mass index is 35.65 kg/m.  Assessment:  58 y.o.malewith a history of HTN, CAD, and nephrolithiasis who presented to the [facility] ED with abdominal pain found to have intraperitoneal fluid, suspected hemorrhage on CT abd/pelvis with subsequent CT pyelogram demonstrating ruptured bladder and hyperkalemic AKI with creatinine 7.7, K 6.1. Urology performed ex lap with bladder repair 4/13. Abdominal XR and CT abdomen and pelvis with contrast shows persistent dilated loops of small bowel. Prolonged ileus.  GI: 4000 nls NG Output, last BM 4/15 Endo: Not on SSI prior to TPN, will start and check sugars with TPN Insulin requirements in the past 24 hours: 0 Lytes: K 4.9; Ca 9.2 w/o correction Renal: Scr 1.11 Pulm: RA Cards: BP 102-124/71-81; HR 94 - 108  Hepatobil: No labs, check tomorrow Neuro: no problems ID: WBC 9.6, afebrile, no antibiotics presently  TPN Access: PICC placement ordered 4/20 TPN start date: 4/20 Nutritional Goals (per RD recommendation on 4/17): Kcal:  1900-2100 Protein:  100-115 grams Fluid:  1.9 - 2.1 L/day  Goal TPN rate is 85 ml/hr (This TPN provides 106 g of protein, 278 g of dextrose, and 70 g of lipids which provides 1999 kCals per day, meeting 100% of patient needs)  Current Nutrition:  NPO D5NS at 125 ml/hr + LR at 100 ml/hr  Plan:  Initiate TPN at 40 mL/hr. This TPN provides 50 g of protein, 130.5 g of dextrose, and 33 g of lipids which provides 940 kCals per day, meeting 50% of patient needs Electrolytes in TPN: Standard Add MVI to TPN - trace elements are on back order will only place in TPN on Monday, Wednesday and Friday Will d/c thiamine and folate and add to TPN Initiate moderate SSI and adjust as  needed Decrease IVMF D5NS to 85 ml/hr when TPN hung at 1800 Monitor TPN labs  Jeanella Cara, PharmD, [facility] Clinical Pharmacist Please see AMION for all Pharmacists' Contact Phone Numbers 02/07/2019, 8:01 AM

## 2019-02-07 NOTE — Progress Notes (Signed)
   02/07/19 1500  PT Visit Information  Last PT Received On 02/07/19  Assistance Needed +1  History of Present Illness Patient is a 58 yo male who came in for fall with significant abdominal pain found to have free fluid in abdomen and is now s/p X-lap with repair of the bladder rupture and drainage of the intraperitoneal urine  Subjective Data  Subjective agreeable to mobility, states was able to have BM  Patient Stated Goal to get moving more  Precautions  Precautions Fall  Restrictions  Weight Bearing Restrictions No  Pain Assessment  Pain Assessment Faces  Faces Pain Scale 6  Pain Location right knee pain  Pain Descriptors / Indicators Sore  Pain Intervention(s) Monitored during session  Cognition  Arousal/Alertness Awake/alert  Behavior [facility] for tasks assessed/performed  Overall Cognitive Status Within Functional Limits for tasks assessed  Bed Mobility  Overal bed mobility Needs Assistance  Bed Mobility Sit to Supine  Supine to sit Min assist  Sit to supine Min assist  General bed mobility comments Min assist to elevate LEs back to bed  Transfers  Overall transfer level Needs assistance  Equipment used Rolling walker (2 wheeled)  Transfers Sit to/from Stand  Sit to Stand Supervision  General transfer comment supervision for safety and line management, no physical assist required  Ambulation/Gait  Ambulation/Gait assistance Supervision  Gait Distance (Feet) 300 Feet  Assistive device Rolling walker (2 wheeled)  Gait Pattern/deviations WFL(Within Functional Limits)  General Gait Details use of Rw for mobility, improved overall functional level, Hr elevations to 150s with activity  Gait velocity mildly decreased  Balance  Overall balance assessment History of Falls  Standardized Balance Assessment  Standardized Balance Assessment  Dynamic Gait Index  PT - End of Session  Equipment Utilized During Treatment Gait belt  Activity Tolerance Patient tolerated  treatment well  Patient left in bed;with call bell/phone within reach  Nurse Communication Mobility status   PT - Assessment/Plan  PT Plan Current plan remains appropriate  PT Visit Diagnosis History of falling (Z91.81)  PT Frequency (ACUTE ONLY) Min 5X/week  Follow Up Recommendations No PT follow up  PT equipment None recommended by PT  AM-PAC PT "6 Clicks" Mobility Outcome Measure (Version 2)  Help needed turning from your back to your side while in a flat bed without using bedrails? 4  Help needed moving from lying on your back to sitting on the side of a flat bed without using bedrails? 4  Help needed moving to and from a bed to a chair (including a wheelchair)? 4  Help needed standing up from a chair using your arms (e.g., wheelchair or bedside chair)? 3  Help needed to walk in hospital room? 3  Help needed climbing 3-5 steps with a railing?  3  6 Click Score 21  Consider Recommendation of Discharge To: Home with no services  PT Goal Progression  Progress towards PT goals Progressing toward goals  Acute Rehab PT Goals  PT Goal Formulation With patient  Time For Goal Achievement 02/21/19  Potential to Achieve Goals Good  PT Time Calculation  PT Start Time (ACUTE ONLY) 1524  PT Stop Time (ACUTE ONLY) 1542  PT Time Calculation (min) (ACUTE ONLY) 18 min  PT General Charges  $$ ACUTE PT VISIT 1 Visit  PT Treatments  $Gait Training 8-22 mins  Charlotte Crumb, PT DPT  Board Certified Neurologic Specialist Acute Rehabilitation Services Pager [phone] Office [phone]

## 2019-02-07 NOTE — Progress Notes (Signed)
Nutrition Follow-up  DOCUMENTATION CODES:   Obesity unspecified  INTERVENTION:  TPN per Pharmacy.  Clear liquid diet as tolerated.  NUTRITION DIAGNOSIS:   Inadequate oral intake related to inability to eat as evidenced by NPO status; diet advanced to clear liquids, progressing  GOAL:   Patient will meet greater than or equal to 90% of their needs; progressing  MONITOR:   Labs, Skin, Diet advancement, Weight trends, I & O's  REASON FOR ASSESSMENT:   NPO/Clear Liquid Diet    ASSESSMENT:   58 y.o. male with a history of HTN, CAD, and nephrolithiasis who presented to the [facility] ED with abdominal pain found to have intraperitoneal fluid, suspected hemorrhage on CT abd/pelvis with subsequent CT pyelogram demonstrating ruptured bladder. Urology performed ex lap with bladder repair 4/13.  Post op pt with  ileus vs. partial SBO  NG remains in place to suctions. NGT output remains high with 4000 mL. Pt reports no nausea. Diet advanced to a clear liquid diet. Plans to initiate TPN today as ileus ongoing. PICC has been placed. Per Pharmacy, plans to initiate TPN at 40 ml/hr to provide 940 kcal and 50 grams of protein which is providing 50% of nutrition needs. Goal rate TPN to be 85 ml/hr to provide 1999 kcal and 106 grams of protein. Labs and medications reviewed.   Diet Order:   Diet Order            Diet clear liquid Room service appropriate? Yes; Fluid consistency: Thin  Diet effective now              EDUCATION NEEDS:   Not appropriate for education at this time  Skin:  Skin Assessment: Skin Integrity Issues: Skin Integrity Issues:: Incisions Incisions: abdomen  Last BM:  4/15  Height:   Ht Readings from Last 1 Encounters:  02/04/19 5\' 6"  (1.676 m)    Weight:   Wt Readings from Last 1 Encounters:  02/04/19 100.2 kg    Ideal Body Weight:  64.5 kg  BMI:  Body mass index is 35.65 kg/m.  Estimated Nutritional Needs:   Kcal:  1900-2100  Protein:  100-115  grams  Fluid:  1.9 - 2.1 L/day   Roslyn Smiling, MS, RD, LDN Pager # [phone] After hours/ weekend pager # [phone]

## 2019-02-07 NOTE — Evaluation (Signed)
Physical Therapy Evaluation Patient Details Name: Caleb ChurchJoseph T Potter MRN: [id number] DOB: [birth year] Today's Date: 02/07/2019   History of Present Illness  Patient is a 58 yo male who came in for fall with significant abdominal pain found to have free fluid in abdomen and is now s/p X-lap with repair of the bladder rupture and drainage of the intraperitoneal urine  Clinical Impression  Orders received for PT re-evaluation. Patient demonstrates modest deficits in functional mobility as indicated below. Will benefit from continued skilled PT to address deficits and maximize function. Will see as indicated and progress as tolerated. Encouraged continued mobility with staff.     Follow Up Recommendations No PT follow up    Equipment Recommendations  None recommended by PT    Recommendations for Other Services       Precautions / Restrictions Precautions Precautions: Fall Restrictions Weight Bearing Restrictions: No      Mobility  Bed Mobility Overal bed mobility: Needs Assistance Bed Mobility: Supine to Sit     Supine to sit: Min assist     General bed mobility comments: min assit for comfort  Transfers Overall transfer level: Needs assistance Equipment used: Rolling walker (2 wheeled) Transfers: Sit to/from Stand Sit to Stand: Supervision         General transfer comment: supervision for safety and line management, no physical assist required  Ambulation/Gait Ambulation/Gait assistance: Supervision Gait Distance (Feet): 340 Feet Assistive device: Rolling walker (2 wheeled) Gait Pattern/deviations: WFL(Within Functional Limits) Gait velocity: mildly decreased   General Gait Details: Steady with use of RW  Stairs            Wheelchair Mobility    Modified Rankin (Stroke Patients Only)       Balance Overall balance assessment: History of Falls                               Standardized Balance Assessment Standardized Balance Assessment  : Dynamic Gait Index   Dynamic Gait Index Level Surface: Normal Change in Gait Speed: Mild Impairment Gait with Horizontal Head Turns: Mild Impairment Gait with Vertical Head Turns: Normal Gait and Pivot Turn: Normal Step Over Obstacle: Normal Step Around Obstacles: Mild Impairment Steps: Mild Impairment Total Score: 20       Pertinent Vitals/Pain Pain Assessment: Faces Faces Pain Scale: Hurts little more Pain Location: arthritic pain in joints (left knee) Pain Descriptors / Indicators: Sore Pain Intervention(s): Monitored during session    Home Living Family/patient expects to be discharged to:: Private residence Living Arrangements: Spouse/significant other;Children Available Help at Discharge: Family Type of Home: House Home Access: Stairs to enter   Secretary/administratorntrance Stairs-Number of Steps: 3 Home Layout: One level        Prior Function Level of Independence: Independent               Hand Dominance   Dominant Hand: Right    Extremity/Trunk Assessment   Upper Extremity Assessment Upper Extremity Assessment: Overall WFL for tasks assessed    Lower Extremity Assessment Lower Extremity Assessment: Generalized weakness       Communication   Communication: No difficulties  Cognition Arousal/Alertness: Awake/alert Behavior During Therapy: WFL for tasks assessed/performed Overall Cognitive Status: Within Functional Limits for tasks assessed  General Comments      Exercises     Assessment/Plan    PT Assessment Patient needs continued PT services  PT Problem List Decreased strength;Decreased mobility       PT Treatment Interventions DME instruction;Gait training;Stair training;Functional mobility training;Therapeutic activities;Therapeutic exercise;Balance training;Neuromuscular re-education    PT Goals (Current goals can be found in the Care Plan section)  Acute Rehab PT Goals Patient Stated  Goal: to get moving more PT Goal Formulation: With patient Time For Goal Achievement: 02/21/19 Potential to Achieve Goals: Good    Frequency Min 5X/week   Barriers to discharge        Co-evaluation               AM-PAC PT "6 Clicks" Mobility  Outcome Measure Help needed turning from your back to your side while in a flat bed without using bedrails?: None Help needed moving from lying on your back to sitting on the side of a flat bed without using bedrails?: None Help needed moving to and from a bed to a chair (including a wheelchair)?: None Help needed standing up from a chair using your arms (e.g., wheelchair or bedside chair)?: A Little Help needed to walk in hospital room?: A Little Help needed climbing 3-5 steps with a railing? : A Little 6 Click Score: 21    End of Session Equipment Utilized During Treatment: Gait belt Activity Tolerance: Patient tolerated treatment well Patient left: in chair;with call bell/phone within reach Nurse Communication: Mobility status PT Visit Diagnosis: History of falling (Z91.81)    Time: 1140-1157 PT Time Calculation (min) (ACUTE ONLY): 17 min   Charges:   PT Evaluation $PT Re-evaluation: 1 Re-eval          Charlotte Crumb, PT DPT  Board Certified Neurologic Specialist Acute Rehabilitation Services Pager [phone] Office [phone]   Fabio Asa 02/07/2019, 12:28 PM

## 2019-02-08 ENCOUNTER — Inpatient Hospital Stay (HOSPITAL_COMMUNITY): Payer: BLUE CROSS/BLUE SHIELD

## 2019-02-08 LAB — GLUCOSE, CAPILLARY
Glucose-Capillary: 115 mg/dL — ABNORMAL HIGH (ref 70–99)
Glucose-Capillary: 121 mg/dL — ABNORMAL HIGH (ref 70–99)
Glucose-Capillary: 128 mg/dL — ABNORMAL HIGH (ref 70–99)
Glucose-Capillary: 131 mg/dL — ABNORMAL HIGH (ref 70–99)
Glucose-Capillary: 138 mg/dL — ABNORMAL HIGH (ref 70–99)
Glucose-Capillary: 143 mg/dL — ABNORMAL HIGH (ref 70–99)

## 2019-02-08 LAB — CBC
HCT: 36.5 % — ABNORMAL LOW (ref 39.0–52.0)
Hemoglobin: 11.8 g/dL — ABNORMAL LOW (ref 13.0–17.0)
MCH: 31.6 pg (ref 26.0–34.0)
MCHC: 32.3 g/dL (ref 30.0–36.0)
MCV: 97.6 fL (ref 80.0–100.0)
Platelets: 275 10*3/uL (ref 150–400)
RBC: 3.74 MIL/uL — ABNORMAL LOW (ref 4.22–5.81)
RDW: 13.2 % (ref 11.5–15.5)
WBC: 8.7 10*3/uL (ref 4.0–10.5)
nRBC: 0 % (ref 0.0–0.2)

## 2019-02-08 LAB — COMPREHENSIVE METABOLIC PANEL
ALT: 103 U/L — ABNORMAL HIGH (ref 0–44)
AST: 87 U/L — ABNORMAL HIGH (ref 15–41)
Albumin: 2.5 g/dL — ABNORMAL LOW (ref 3.5–5.0)
Alkaline Phosphatase: 153 U/L — ABNORMAL HIGH (ref 38–126)
Anion gap: 10 (ref 5–15)
BUN: 19 mg/dL (ref 6–20)
CO2: 26 mmol/L (ref 22–32)
Calcium: 9.1 mg/dL (ref 8.9–10.3)
Chloride: 98 mmol/L (ref 98–111)
Creatinine, Ser: 1.09 mg/dL (ref 0.61–1.24)
GFR calc Af Amer: >60 mL/min (ref >60)
GFR calc non Af Amer: >60 mL/min (ref >60)
Glucose, Bld: 122 mg/dL — ABNORMAL HIGH (ref 70–99)
Potassium: 4.3 mmol/L (ref 3.5–5.1)
Sodium: 134 mmol/L — ABNORMAL LOW (ref 135–145)
Total Bilirubin: 3 mg/dL — ABNORMAL HIGH (ref 0.3–1.2)
Total Protein: 6.2 g/dL — ABNORMAL LOW (ref 6.5–8.1)

## 2019-02-08 LAB — DIFFERENTIAL
Abs Immature Granulocytes: 0.13 10*3/uL — ABNORMAL HIGH (ref 0.00–0.07)
Basophils Absolute: 0 10*3/uL (ref 0.0–0.1)
Basophils Relative: 0 %
Eosinophils Absolute: 0.3 10*3/uL (ref 0.0–0.5)
Eosinophils Relative: 4 %
Immature Granulocytes: 2 %
Lymphocytes Relative: 18 %
Lymphs Abs: 1.6 10*3/uL (ref 0.7–4.0)
Monocytes Absolute: 1.5 10*3/uL — ABNORMAL HIGH (ref 0.1–1.0)
Monocytes Relative: 17 %
Neutro Abs: 5.2 10*3/uL (ref 1.7–7.7)
Neutrophils Relative %: 59 %

## 2019-02-08 LAB — PREALBUMIN: Prealbumin: 11.4 mg/dL — ABNORMAL LOW (ref 18–38)

## 2019-02-08 LAB — TRIGLYCERIDES: Triglycerides: 128 mg/dL (ref <150)

## 2019-02-08 LAB — PHOSPHORUS: Phosphorus: 4.1 mg/dL (ref 2.5–4.6)

## 2019-02-08 LAB — MAGNESIUM: Magnesium: 1.9 mg/dL (ref 1.7–2.4)

## 2019-02-08 MED ORDER — DEXTROSE-NACL 5-0.9 % IV SOLN
INTRAVENOUS | Status: DC
  Administered 2019-02-08 – 2019-02-09 (×2): via INTRAVENOUS

## 2019-02-08 MED ORDER — TRAVASOL 10 % IV SOLN
INTRAVENOUS | Status: DC
  Administered 2019-02-08: 18:00:00 via INTRAVENOUS
  Filled 2019-02-08: qty 1081.2

## 2019-02-08 MED ORDER — METOCLOPRAMIDE HCL 5 MG/ML IJ SOLN
10.0000 mg | Freq: Four times a day (QID) | INTRAMUSCULAR | Status: DC
  Administered 2019-02-08 – 2019-02-12 (×18): 10 mg via INTRAVENOUS
  Filled 2019-02-08 (×18): qty 2

## 2019-02-08 MED ORDER — BISACODYL 10 MG RE SUPP
10.0000 mg | Freq: Once | RECTAL | Status: DC
  Filled 2019-02-08: qty 1

## 2019-02-08 NOTE — Progress Notes (Signed)
Patient Demographics:    Caleb Potter, is a 58 y.o. male, DOB - February 16, 1961, ZOX:[id number]  Admit date - 01/30/2019   Admitting Physician Bjorn Pippin, MD  Outpatient Primary MD for the patient is [name], MD  LOS - 9   Chief Complaint  Patient presents with   Near Syncope   Fall        Subjective:    [name] today has no fevers,   No chest pain, overnight NG tube was clamped, patient has significant nausea, abdominal pain and abdominal distention... NG tube with back again to suction,  patient having very minimal flatus  Assessment  & Plan :    Principal Problem:   Intraperitoneal rupture of bladder Active Problems:   Intraperitoneal hematoma   Hypertension   Coronary artery disease   AKI (acute kidney injury) (HCC)   Hyperkalemia   Anxiety  Brief Summary Caleb Potter is a 58 y.o. male with a history of HTN, CAD, and nephrolithiasis who presented to the [facility] ED with abdominal pain found to have intraperitoneal fluid, suspected hemorrhage on CT abd/pelvis with subsequent CT pyelogram demonstrating ruptured bladder and hyperkalemic AKI with creatinine 7.7, K 6.1. Urology performed ex lap with bladder repair 4/13. IV fluids, lokelma, bicarbonate, and calcium gluconate were provided as well. Postoperatively renal function and electrolytes have normalized, though he has an ileus vs. partial SBO with NG tube placed. Abdominal XR and CT abdomen and pelvis with contrast shows persistent dilated loops of small bowel.  C/n IV TPN pending return of bowel function  A/p 1)Ileus vs. pSBO:  -  Very high output from NG tube, Patient remains n.p.o., surgical consult from Dr. Derrell Lolling appreciated continue conservative management at this time for presumed postoperative ileus--electrolytes are within normal limits,  C/n IV TPN pending return of bowel function-  overnight NG tube was clamped, patient  has significant nausea, abdominal pain and abdominal distention, repeat abd x-rays on 02/08/19 with generalized ileus  2) AKI: Due to BOO and uremic labs due to reabsorption of uroperitoneum. Resolved.  -  IV TPN pending return of bowel function,  NPO -Continue to hold ACEi for now,  Avoid nephrotoxins  3)Alcohol abuse:  Last drink was at least 4 days PTA, no risk for DTs-May change from IV to oral folic acid and thiamine when oral intake resumes-    4)HTN--stable, continue IV metoprolol with parameters, continue to avoid ACEI  5)COPD: -No acute exacerbations, use bronchodilators as ordered  6)H/o CAD--no ACS type symptoms at that time, IV metoprolol as ordered, once oral intake resumes will restart aspirin and statin  7)Bladder rupture: s/p ex lap, repair 4/13.  - Per urology. -Completed Rocephin   8)Acute blood loss anemia: Mild, stable, hemoglobin above 12 , no ongoing bleeding   9)FEN--patient is n.p.o. due to postop ileus, continue IV TPN until oral intake resumes--pending return of bowel function  10)Elevated LFTs--- patient with history of hepatic steatosis and history of alcohol abuse however is ALT is 103 and his AST is 87 with alkaline phosphatase of 153 and total bili of 3.0, get liver ultrasound and barotitis profile.... Follow LFTs especially while on TPN  Disposition/Need for in-Hospital Stay- patient unable to be discharged at this time due to  persistent postop ileus with inability to take oral intake patient needs IV fluids while n.p.o..... Currently on IV TPN pending return of bowel function  Code Status : full  Family Communication:   na   Disposition Plan  : TBD (awaiting return of bowel function so patient can have oral intake)  Consults  : Urology is primary team, general surgery was consulted  DVT Prophylaxis  :    Heparin - SCDs   Lab Results  Component Value Date   PLT 275 02/08/2019    Inpatient Medications  Scheduled Meds:  bisacodyl  10 mg  Rectal Once   finasteride  5 mg Oral Daily   fluticasone furoate-vilanterol  1 puff Inhalation Daily   heparin injection (subcutaneous)  5,000 Units Subcutaneous Q8H   insulin aspart  0-15 Units Subcutaneous Q4H   metoCLOPramide (REGLAN) injection  10 mg Intravenous Q6H   metoprolol tartrate  5 mg Intravenous Q6H   sodium chloride flush  10-40 mL Intracatheter Q12H   umeclidinium bromide  1 puff Inhalation Daily   Continuous Infusions:  dextrose 5 % and 0.9% NaCl 85 mL/hr at 02/08/19 0814   dextrose 5 % and 0.9% NaCl     lactated ringers Stopped (02/07/19 1900)   TPN ADULT (ION) 40 mL/hr at 02/07/19 1743   TPN ADULT (ION) 85 mL/hr at 02/08/19 1730   PRN Meds:.acetaminophen **OR** acetaminophen, diphenhydrAMINE **OR** diphenhydrAMINE, HYDROcodone-acetaminophen, HYDROmorphone (DILAUDID) injection, LORazepam, menthol-cetylpyridinium, morphine injection, ondansetron **OR** ondansetron (ZOFRAN) IV, oxybutynin, oxyCODONE, senna-docusate, sodium chloride flush, sodium phosphate, zolpidem    Anti-infectives (From admission, onward)   Start     Dose/Rate Route Frequency Ordered Stop   02/01/19 0100  cefTRIAXone (ROCEPHIN) 2 g in sodium chloride 0.9 % 100 mL IVPB  Status:  Discontinued     2 g 200 mL/hr over 30 Minutes Intravenous Every 24 hours 01/31/19 0223 02/03/19 0855   01/31/19 0030  cefTRIAXone (ROCEPHIN) 2 g in sodium chloride 0.9 % 100 mL IVPB     2 g 200 mL/hr over 30 Minutes Intravenous  Once 01/31/19 0015 01/31/19 0240        Objective:   Vitals:   02/08/19 0837 02/08/19 0838 02/08/19 1150 02/08/19 1555  BP:   120/79 98/79  Pulse:   (!) 103 96  Resp:   (!) 26 18  Temp:   97.7 F (36.5 C) 98.3 F (36.8 C)  TempSrc:   Oral Oral  SpO2: 94% 94% 95% 97%  Weight:      Height:        Wt Readings from Last 3 Encounters:  02/08/19 94.8 kg  11/07/14 93.4 kg  11/03/14 93.4 kg     Intake/Output Summary (Last 24 hours) at 02/08/2019 1731 Last data filed at  02/08/2019 1725 Gross per 24 hour  Intake 1082.48 ml  Output 1275 ml  Net -192.52 ml     Physical Exam Patient is examined daily including today on 02/08/19 , exams remain the same as of yesterday except that has changed   Gen:- Awake Alert,  In no apparent distress  HEENT:- [place].AT, No sclera icterus  Nose-NG tube with gastric contents Neck-Supple Neck,No JVD,.   Lungs-  CTAB , fair symmetrical air movement CV- S1, S2 normal, regular  Abd-abdomen is more tense, and more distended, diminished bowel sounds, tender without rebound or guarding, postop wound is clean dry and intact,  Extremity/Skin:- No  edema, pedal pulses present , PICC line in situ Psych-affect is appropriate, oriented x3 Neuro-no new focal  deficits, no tremors GU--Foley with clear urine   Data Review:   Micro Results Recent Results (from the past 240 hour(s))  MRSA PCR Screening     Status: None   Collection Time: 01/31/19  2:40 AM  Result Value Ref Range Status   MRSA by PCR NEGATIVE NEGATIVE Final    Comment:        The GeneXpert MRSA Assay (FDA approved for NASAL specimens only), is one component of a comprehensive MRSA colonization surveillance program. It is not intended to diagnose MRSA infection nor to guide or monitor treatment for MRSA infections. Performed at [facility], [address], [place], [place] [address]     Radiology Reports Ct Abdomen Pelvis Wo Contrast  Result Date: 02/04/2019 CLINICAL DATA:  Bowel obstruction versus ileus. Bladder rupture on Sunday. Laparotomy 01/30/2019 with bladder repair. EXAM: CT ABDOMEN AND PELVIS WITHOUT CONTRAST TECHNIQUE: Multidetector CT imaging of the abdomen and pelvis was performed following the standard protocol without IV contrast. COMPARISON:  Plain films 02/04/2019.  Most recent CT 01/30/2019 FINDINGS: Lower chest: Right base subsegmental atelectasis. Normal heart size without pericardial or pleural effusion. Multivessel coronary artery  atherosclerosis. Hepatobiliary: Mild hepatic steatosis. Normal gallbladder, without biliary ductal dilatation. Pancreas: Normal, without mass or ductal dilatation. Spleen: Normal in size, without focal abnormality. Adrenals/Urinary Tract: Normal adrenal glands. 3 mm interpolar right renal collecting system calculus. No hydronephrosis. Foley catheter within the urinary bladder. Stomach/Bowel: Nasogastric tube terminates at the gastric body. The colon is normal in caliber. The terminal ileum is normal, including on image 60/3. Proximal and mid small bowel loops are dilated, including at up to 5.6 cm on image 50/3. The mid to distal ileum undergoes a gradual transition to normal caliber, without focal abnormality. No findings to suggest ischemia. Vascular/Lymphatic: Aortic and branch vessel atherosclerosis. No abdominopelvic adenopathy. Reproductive: Mild prostatomegaly. Other: A surgical drain from a left pelvic approach terminates in the cul-de-sac. No residual abdominopelvic fluid. No free intraperitoneal air. Musculoskeletal: Moderate left-sided gynecomastia. Lumbosacral spondylosis. IMPRESSION: 1. Relatively diffuse small bowel dilatation, without focal transition point. Findings are most consistent with postoperative adynamic ileus. 2. Right nephrolithiasis. 3.  Aortic Atherosclerosis (ICD10-I70.0). 4. Hepatic steatosis. 5. Left gynecomastia. Electronically Signed   By: Jeronimo Greaves M.D.   On: 02/04/2019 14:41   Ct Abdomen Pelvis Wo Contrast  Result Date: 01/30/2019 CLINICAL DATA:  Gross hematuria.  Bilateral flank pain after fall. EXAM: CT ABDOMEN AND PELVIS WITHOUT CONTRAST TECHNIQUE: Multidetector CT imaging of the abdomen and pelvis was performed following the standard protocol without IV contrast. COMPARISON:  CT scan of January 26, 2019. FINDINGS: Lower chest: No acute abnormality. Hepatobiliary: No gallstones or biliary dilatation is noted. Mild amount of fluid is noted around the liver with dense  material seen posteriorly consistent with hematoma. Pancreas: Unremarkable. No pancreatic ductal dilatation or surrounding inflammatory changes. Spleen: Fluid is noted around the spleen which was not present on prior exam and is concerning for hemorrhage. Adrenals/Urinary Tract: Adrenal glands appear normal. Nonobstructive right renal calculus is noted. No hydronephrosis or renal obstruction is noted. The amount of hemorrhage seen in urinary bladder on prior exam is significantly decreased, although residual hemorrhage does remain in the dependent portion of the bladder. Stomach/Bowel: The stomach appears normal. There is no evidence of bowel obstruction or inflammation. The appendix is not clearly visualized. Vascular/Lymphatic: Aortic atherosclerosis. No enlarged abdominal or pelvic lymph nodes. Reproductive: Stable mild prostatic enlargement is noted. Other: There does appear to be a moderate amount of  hemorrhage present in the dependent portion of the pelvis as well as in the left pericolic gutter. Musculoskeletal: No acute or significant osseous findings. IMPRESSION: There is interval development of a moderate amount of intraperitoneal hemorrhage seen in the pelvis, both pericolic gutters, and around the liver and spleen. Critical Value/emergent results were called by telephone at the time of interpretation on 01/30/2019 at 8:29 pm to Dr. Terance Hart , who verbally acknowledged these results. The large amount of hemorrhage seen within the urinary bladder on prior exam has significantly improved, with only a mild amount of residual hemorrhage or thrombus seen posteriorly in the urinary bladder on current exam. Nonobstructive right renal calculus. No hydronephrosis or renal obstruction is noted. Stable mild prostatic enlargement. Electronically Signed   By: Lupita Raider, M.D.   On: 01/30/2019 20:30   Dg Abd 1 View  Result Date: 02/01/2019 CLINICAL DATA:  Enteric tube placement EXAM: ABDOMEN - 1 VIEW  COMPARISON:  None. FINDINGS: The tip of the enteric tube is at the expected location of the gastroduodenal junction or first portion of the duodenum. The side port is still likely within the stomach. There are multiple dilated loops of bowel throughout the abdomen. IMPRESSION: Enteric tube tip near the gastroduodenal junction. Electronically Signed   By: Deatra Robinson M.D.   On: 02/01/2019 14:09   Ct Pelvis Wo Contrast  Result Date: 01/30/2019 CLINICAL DATA:  Considerable free fluid within the abdomen on recent CT, request is been made for CT cystography. EXAM: CT PELVIS WITHOUT CONTRAST TECHNIQUE: Multidetector CT imaging of the pelvis was performed following the standard protocol without intravenous contrast. COMPARISON:  CT from earlier in the same day. FINDINGS: Urinary Tract: Contrast material is been instilled through the Foley catheter. The Foley catheter balloon is well visualized. The bladder distends well and demonstrates diffuse intraluminal filling defect consistent with residual thrombus. Additionally there is extravasation of contrast material from the dome of the bladder in a linear fashion which extends for approximately 2.9 cm consistent with bladder rupture. Contrast enhancement of the intraperitoneal fluid seen on the recent CT examination consistent with the extravasation. Bowel:  Bowel is again within normal limits. Vascular/Lymphatic: Atherosclerotic calcifications are seen. No lymphadenopathy is noted. Reproductive: Prostate is mildly prominent but stable from the recent exam. Other: Free fluid is again noted within the abdomen and pelvis related to the bladder perforation. Musculoskeletal: Stable in appearance when compare with the prior CT. IMPRESSION: CT cystography reveals a long segment of bladder discontinuity along the dome consistent with rupture. Passage of contrast material into the peritoneal cavity is seen. Retained thrombus within the bladder is noted. Electronically Signed    By: Alcide Clever M.D.   On: 01/30/2019 23:20   Dg Chest Portable 1 View  Result Date: 01/30/2019 CLINICAL DATA:  Chest pain EXAM: PORTABLE CHEST 1 VIEW COMPARISON:  11/03/2014 FINDINGS: Cardiac shadows within normal limits. Tortuosity of the thoracic aorta is identified somewhat accentuated by the portable technique and patient rotation. The lungs are clear. No acute bony abnormality is noted. IMPRESSION: No acute abnormality noted. Electronically Signed   By: Alcide Clever M.D.   On: 01/30/2019 19:55   Dg Abd Acute 2+v W 1v Chest  Result Date: 02/08/2019 CLINICAL DATA:  Followup postoperative ileus. Persistent abdominal pain and abdominal distension. EXAM: DG ABDOMEN ACUTE W/ 1V CHEST COMPARISON:  02/05/2019 and earlier, including CT abdomen and pelvis 02/04/2019 and earlier. Chest x-ray 01/30/2019 and earlier. FINDINGS: Marked gaseous distension of the small bowel diffusely  and moderate gaseous distention of the ascending and transverse colon has not significantly changed over the past 6 days. The descending colon, sigmoid colon and rectum are decompressed currently. Contrast material from the CT is present throughout the colon. No evidence of free intraperitoneal air on the [employer]. Nasogastric to courses through the stomach with its tip in the proximal duodenum as noted on the recent CT. Cardiac silhouette upper normal in size, unchanged. Lungs remain clear. RIGHT arm PICC tip projects over the LOWER SVC. IMPRESSION: 1. Stable severe generalized ileus (favored over small bowel obstruction). No free intraperitoneal air. 2. No acute cardiopulmonary disease. Electronically Signed   By: Hulan Saas M.D.   On: 02/08/2019 09:46   Dg Abd Portable 1v  Result Date: 02/07/2019 CLINICAL DATA:  58 year old male with abdominal distension EXAM: PORTABLE ABDOMEN - 1 VIEW COMPARISON:  Prior abdominal radiographs 02/05/2019 FINDINGS: Persistent gaseous distension of small bowel throughout the abdomen. The  maximal diameter is 5.7 cm. A gastric tube remains present. The tip overlies the gastric antrum. Previously ingested contrast material opacifies the colon. The colon is not distended. A surgical drain projects over the anatomic pelvis. A staple line is present. The lung bases are clear. IMPRESSION: 1. Persistent gaseous distension of small bowel in the mid abdomen. Contrast material is now present throughout the colon. Findings suggest partial small bowel obstruction versus postoperative small bowel ileus. 2. Well-positioned gastrostomy tube with the tip in the region of the gastric antrum. Electronically Signed   By: Malachy Moan M.D.   On: 02/07/2019 08:13   Dg Abd Portable 1v  Result Date: 02/05/2019 CLINICAL DATA:  Postoperative ileus. Hx of recent repair of ruptured bladder. Pt denies abdominal pain and constipation, stating he has only had ice and water to eat and drink and doesn't expect to be able to have a bowel movement without food. EXAM: PORTABLE ABDOMEN - 1 VIEW COMPARISON:  None. FINDINGS: There is a nasogastric tube with the tip projecting over the antrum of the stomach/pylorus. There is gaseous distension of small bowel and colon most consistent with a postoperative ileus. There is no evidence of pneumoperitoneum, portal venous gas or pneumatosis. There are no pathologic calcifications along the expected course of the ureters. The osseous structures are unremarkable. IMPRESSION: 1. Nasogastric tube with the tip projecting over the antrum of the stomach/pylorus. 2. Persistent gaseous distension of the small bowel and colon consistent with postoperative ileus. Electronically Signed   By: Elige Ko   On: 02/05/2019 13:35   Dg Abd Portable 1v  Result Date: 02/04/2019 CLINICAL DATA:  Ileus. EXAM: PORTABLE ABDOMEN - 1 VIEW COMPARISON:  One-view abdomen 02/01/2019 FINDINGS: Multiple dilated loops of small bowel are again noted. NG tube is in place. The stomach is decompressed. Overall bowel  dilation is improved. IMPRESSION: 1. Improving dilation of small bowel. Electronically Signed   By: Marin Roberts M.D.   On: 02/04/2019 08:35   Dg Abd Portable 1v  Result Date: 02/01/2019 CLINICAL DATA:  Abdominal distension. EXAM: PORTABLE ABDOMEN - 1 VIEW COMPARISON:  CT scan January 30, 2019 FINDINGS: Lung bases are normal. No free air, portal venous gas, or pneumatosis on the supine view. Dilated loops of small bowel are identified. There is also air seen within the ascending and transverse colon which are mildly prominent. The dilated small bowel loops measure up to 5.2 cm. The transverse colon measures 6.6 cm. No other acute abnormalities. IMPRESSION: 1. Dilated loops of small bowel and mildly prominent loops of  colon. The small bowel appears to be dilated out of proportion to the colon suggesting early or partial small bowel obstruction. Given the amount of air in the colon, a developing ileus is also a possibility. Recommend clinical correlation. A CT scan could further evaluate if clinically warranted. Otherwise, recommend attention on close follow-up. These results will be called to the ordering clinician or representative by the Radiologist Assistant, and communication documented in the PACS or zVision Dashboard. Electronically Signed   By: Gerome Samavid  Williams III M.D   On: 02/01/2019 08:18   [place] Ekg Site Rite  Result Date: 02/07/2019 If Site Rite image not attached, placement could not be confirmed due to current cardiac rhythm.    CBC Recent Labs  Lab 02/02/19 0226 02/03/19 0459 02/05/19 1206 02/06/19 0440 02/08/19 0509  WBC 10.9* 8.8 8.1 9.6 8.7  HGB 13.6 12.8* 12.2* 12.5* 11.8*  HCT 40.8 39.7 37.2* 38.9* 36.5*  PLT 190 184 222 262 275  MCV 98.3 98.8 97.4 98.7 97.6  MCH 32.8 31.8 31.9 31.7 31.6  MCHC 33.3 32.2 32.8 32.1 32.3  RDW 14.7 13.9 13.3 13.3 13.2  LYMPHSABS  --   --   --   --  1.6  MONOABS  --   --   --   --  1.5*  EOSABS  --   --   --   --  0.3  BASOSABS  --   --    --   --  0.0    Chemistries  Recent Labs  Lab 02/02/19 0226 02/03/19 0459 02/05/19 1206 02/06/19 0440 02/08/19 0509  NA 137 135 135 135 134*  K 4.5 4.2 4.7 4.9 4.3  CL 102 100 99 97* 98  CO2 24 24 26 28 26   GLUCOSE 134* 116* 136* 129* 122*  BUN 17 17 14 13 19   CREATININE 1.11 1.00 0.96 1.11 1.09  CALCIUM 8.8* 8.6* 9.1 9.2 9.1  MG  --   --   --   --  1.9  AST  --   --   --   --  87*  ALT  --   --   --   --  103*  ALKPHOS  --   --   --   --  153*  BILITOT  --   --   --   --  3.0*   ------------------------------------------------------------------------------------------------------------------ Recent Labs    02/08/19 0509  TRIG 128    No results found for: HGBA1C ------------------------------------------------------------------------------------------------------------------ No results for input(s): TSH, T4TOTAL, T3FREE, THYROIDAB in the last 72 hours.  Invalid input(s): FREET3 ------------------------------------------------------------------------------------------------------------------ No results for input(s): VITAMINB12, FOLATE, FERRITIN, TIBC, IRON, RETICCTPCT in the last 72 hours.  Coagulation profile No results for input(s): INR, PROTIME in the last 168 hours.  No results for input(s): DDIMER in the last 72 hours.  Cardiac Enzymes No results for input(s): CKMB, TROPONINI, MYOGLOBIN in the last 168 hours.  Invalid input(s): CK ------------------------------------------------------------------------------------------------------------------ No results found for: BNP   Shon Haleourage Nadia Torr M.D on 02/08/2019 at 5:31 PM  Go to [email or web address] - for contact info  [facility] - Office  [phone]

## 2019-02-08 NOTE — Progress Notes (Signed)
PT Cancellation Note  Patient Details Name: MOMODOU FRACASSO MRN: [id number] DOB: 02-21-1961   Cancelled Treatment:    Reason Eval/Treat Not Completed: Patient at procedure or test/unavailable Pt off unit for procedure. PT will continue to follow acutely.    Erline Levine, PTA Acute Rehabilitation Services Pager: [phone] Office: [phone]   02/08/2019, 9:12 AM

## 2019-02-08 NOTE — Progress Notes (Signed)
9 Days Post-Op Subjective: Patient reports feeling well.  He's still distended but feels much better when he ambulates. Did amb 3x yesterday but has not yet today.  Bm yesterday.  Minimal flatus today.  Has not received supp today.  NGT in place with dark red/brown output. UOP good with clear/pink urine. No pain and is not taking pain medication. Pt asked why he had an incision and operation. Surgery was explained and he voiced understanding as well as appreciation for explanation.   Objective: Vital signs in last 24 hours: Temp:  [97.2 F (36.2 C)-99 F (37.2 C)] 98 F (36.7 C) (04/21 0746) Pulse Rate:  [98-127] 112 (04/21 0746) Resp:  [15-20] 15 (04/21 0746) BP: (99-117)/(66-83) 111/83 (04/21 0746) SpO2:  [90 %-94 %] 94 % (04/21 0838) Weight:  [86.2 kg-94.8 kg] 94.8 kg (04/21 0429)  Intake/Output from previous day: 04/20 0701 - 04/21 0700 In: 1562.5 [P.O.:480; I.V.:1082.5] Out: 1200 [Urine:1200] Intake/Output this shift: No intake/output data recorded.  Physical Exam:  General:alert, cooperative and no distress GI: distended; +BS; tympanic; non tender; incision C/D/I Foley in place with clear/pink urine in bag  . Current Facility-Administered Medications:  .  acetaminophen (TYLENOL) tablet 650 mg, 650 mg, Oral, Q6H PRN **OR** acetaminophen (TYLENOL) suppository 650 mg, 650 mg, Rectal, Q6H PRN, Opyd, Lavone Neriimothy S, MD .  bisacodyl (DULCOLAX) suppository 10 mg, 10 mg, Rectal, Once, Manus Ruddsuei, Matthew, MD .  dextrose 5 %-0.9 % sodium chloride infusion, , Intravenous, Continuous, Quenton FetterMillen, Jessica B, RPH, Last Rate: 85 mL/hr at 02/08/19 0814 .  dextrose 5 %-0.9 % sodium chloride infusion, , Intravenous, Continuous, Quenton FetterMillen, Jessica B, RPH .  diphenhydrAMINE (BENADRYL) injection 12.5-25 mg, 12.5-25 mg, Intravenous, Q6H PRN **OR** diphenhydrAMINE (BENADRYL) 12.5 MG/5ML elixir 12.5-25 mg, 12.5-25 mg, Oral, Q6H PRN, Bjorn PippinWrenn, John, MD .  finasteride (PROSCAR) tablet 5 mg, 5 mg, Oral, Daily, Bjorn PippinWrenn,  John, MD, 5 mg at 02/08/19 1030 .  fluticasone furoate-vilanterol (BREO ELLIPTA) 100-25 MCG/INH 1 puff, 1 puff, Inhalation, Daily, Bjorn PippinWrenn, John, MD, 1 puff at 02/08/19 0837 .  heparin injection 5,000 Units, 5,000 Units, Subcutaneous, Q8H, Bjorn PippinWrenn, John, MD, 5,000 Units at 02/07/19 1541 .  HYDROcodone-acetaminophen (NORCO/VICODIN) 5-325 MG per tablet 1-2 tablet, 1-2 tablet, Oral, Q4H PRN, Opyd, Timothy S, MD .  HYDROmorphone (DILAUDID) injection 0.5-1 mg, 0.5-1 mg, Intravenous, Q2H PRN, Bjorn PippinWrenn, John, MD, 1 mg at 02/02/19 2104 .  insulin aspart (novoLOG) injection 0-15 Units, 0-15 Units, Subcutaneous, Q4H, Tera Materierce, Catherine A, [place], 2 Units at 02/08/19 0848 .  lactated ringers infusion, , Intravenous, Continuous, Sherryll BurgerShah, Pratik D, DO, Stopped at 02/07/19 1900 .  LORazepam (ATIVAN) injection 1 mg, 1 mg, Intravenous, Q6H PRN, Mariea ClontsEmokpae, Courage, MD, 1 mg at 02/08/19 0847 .  menthol-cetylpyridinium (CEPACOL) lozenge 3 mg, 1 lozenge, Oral, PRN, Focht, Jessica L, PA .  metoCLOPramide (REGLAN) injection 10 mg, 10 mg, Intravenous, Q6H, Manus Ruddsuei, Matthew, MD .  metoprolol tartrate (LOPRESSOR) injection 5 mg, 5 mg, Intravenous, Q6H, Emokpae, Courage, MD, 5 mg at 02/07/19 1838 .  morphine 4 MG/ML injection 4 mg, 4 mg, Intravenous, Q4H PRN, Opyd, Timothy S, MD .  ondansetron (ZOFRAN) tablet 4 mg, 4 mg, Oral, Q6H PRN **OR** ondansetron (ZOFRAN) injection 4 mg, 4 mg, Intravenous, Q6H PRN, Opyd, Lavone Neriimothy S, MD, 4 mg at 02/01/19 1237 .  oxybutynin (DITROPAN) tablet 5 mg, 5 mg, Oral, Q8H PRN, Bjorn PippinWrenn, John, MD .  oxyCODONE (Oxy IR/ROXICODONE) immediate release tablet 5 mg, 5 mg, Oral, Q4H PRN, Bjorn PippinWrenn, John, MD .  senna-docusate (Senokot-S) tablet 1  tablet, 1 tablet, Oral, QHS PRN, Bjorn Pippin, MD .  sodium chloride flush (NS) 0.9 % injection 10-40 mL, 10-40 mL, Intracatheter, Q12H, Bjorn Pippin, MD, 10 mL at 02/08/19 1032 .  sodium chloride flush (NS) 0.9 % injection 10-40 mL, 10-40 mL, Intracatheter, PRN, Bjorn Pippin, MD .  sodium  phosphate (FLEET) 7-19 GM/118ML enema 1 enema, 1 enema, Rectal, Once PRN, Bjorn Pippin, MD .  TPN ADULT (ION), , Intravenous, Continuous TPN, Tera Mater [facility], Last Rate: 40 mL/hr at 02/07/19 1743 .  TPN ADULT (ION), , Intravenous, Continuous TPN, Millen, Jessica B, RPH .  umeclidinium bromide (INCRUSE ELLIPTA) 62.5 MCG/INH 1 puff, 1 puff, Inhalation, Daily, Bjorn Pippin, MD, 1 puff at 02/08/19 0837 .  zolpidem (AMBIEN) tablet 5 mg, 5 mg, Oral, QHS PRN,MR X 1, Bjorn Pippin, MD  Lab Results: Recent Labs    02/05/19 1206 02/06/19 0440 02/08/19 0509  HGB 12.2* 12.5* 11.8*  HCT 37.2* 38.9* 36.5*   BMET Recent Labs    02/06/19 0440 02/08/19 0509  NA 135 134*  K 4.9 4.3  CL 97* 98  CO2 28 26  GLUCOSE 129* 122*  BUN 13 19  CREATININE 1.11 1.09  CALCIUM 9.2 9.1   No results for input(s): LABPT, INR in the last 72 hours. No results for input(s): LABURIN in the last 72 hours. Results for orders placed or performed during the hospital encounter of 01/30/19  MRSA PCR Screening     Status: None   Collection Time: 01/31/19  2:40 AM  Result Value Ref Range Status   MRSA by PCR NEGATIVE NEGATIVE Final    Comment:        The GeneXpert MRSA Assay (FDA approved for NASAL specimens only), is one component of a comprehensive MRSA colonization surveillance program. It is not intended to diagnose MRSA infection nor to guide or monitor treatment for MRSA infections. Performed at [facility], [address] [place], [place] [address]     Studies/Results: Dg Abd Acute 2+v W 1v Chest  Result Date: 02/08/2019 CLINICAL DATA:  Followup postoperative ileus. Persistent abdominal pain and abdominal distension. EXAM: DG ABDOMEN ACUTE W/ 1V CHEST COMPARISON:  02/05/2019 and earlier, including CT abdomen and pelvis 02/04/2019 and earlier. Chest x-ray 01/30/2019 and earlier. FINDINGS: Marked gaseous distension of the small bowel diffusely and moderate gaseous distention of the ascending  and transverse colon has not significantly changed over the past 6 days. The descending colon, sigmoid colon and rectum are decompressed currently. Contrast material from the CT is present throughout the colon. No evidence of free intraperitoneal air on the [employer]. Nasogastric to courses through the stomach with its tip in the proximal duodenum as noted on the recent CT. Cardiac silhouette upper normal in size, unchanged. Lungs remain clear. RIGHT arm PICC tip projects over the LOWER SVC. IMPRESSION: 1. Stable severe generalized ileus (favored over small bowel obstruction). No free intraperitoneal air. 2. No acute cardiopulmonary disease. Electronically Signed   By: Hulan Saas M.D.   On: 02/08/2019 09:46   Dg Abd Portable 1v  Result Date: 02/07/2019 CLINICAL DATA:  58 year old male with abdominal distension EXAM: PORTABLE ABDOMEN - 1 VIEW COMPARISON:  Prior abdominal radiographs 02/05/2019 FINDINGS: Persistent gaseous distension of small bowel throughout the abdomen. The maximal diameter is 5.7 cm. A gastric tube remains present. The tip overlies the gastric antrum. Previously ingested contrast material opacifies the colon. The colon is not distended. A surgical drain projects over the anatomic pelvis. A staple  line is present. The lung bases are clear. IMPRESSION: 1. Persistent gaseous distension of small bowel in the mid abdomen. Contrast material is now present throughout the colon. Findings suggest partial small bowel obstruction versus postoperative small bowel ileus. 2. Well-positioned gastrostomy tube with the tip in the region of the gastric antrum. Electronically Signed   By: Malachy Moan M.D.   On: 02/07/2019 08:13   [place] Ekg Site Rite  Result Date: 02/07/2019 If Site Rite image not attached, placement could not be confirmed due to current cardiac rhythm.   Assessment/Plan: 9 Days Post-Op Procedure(s) (LRB): EXPLORATORY LAPAROTOMY (N/A) Bladder Repair  Bladder rupture:         Maintain foley, flomax, and finasteride.          Is receiving oxybutynin despite no complaints of pain or bladder spasms. Consider d/c med to decrease                      constipation issues with ileus.   Ileus:       improving slowly.         AXR slightly improved today.        Dulcolax supp.        Amb as much as possible.  Continue PT       Continue TPN, IVF, NG until return of bowel function   LOS: 9 days   Harrie Foreman 02/08/2019, 11:30 AM

## 2019-02-08 NOTE — Progress Notes (Signed)
9 Days Post-Op   Subjective/Chief Complaint: Had a BM yesterday after Dulcolax. Felt much better, tolerated some clear liquids However, overnight he became much more bloated No nausea or vomiting Now back on NG suction and feels slightly better Some flatus Plain films pending   Objective: Vital signs in last 24 hours: Temp:  [97.2 F (36.2 C)-99 F (37.2 C)] 98 F (36.7 C) (04/21 0746) Pulse Rate:  [98-127] 112 (04/21 0746) Resp:  [15-20] 15 (04/21 0746) BP: (99-117)/(66-83) 111/83 (04/21 0746) SpO2:  [90 %-94 %] 94 % (04/21 0838) Weight:  [86.2 kg-94.8 kg] 94.8 kg (04/21 0429) Last BM Date: 02/07/19  Intake/Output from previous day: 04/20 0701 - 04/21 0700 In: 1562.5 [P.O.:480; I.V.:1082.5] Out: 1200 [Urine:1200] Intake/Output this shift: No intake/output data recorded.  General appearance: alert, cooperative and no distress GI: + BS, non-tender; distended, tympanitic  Lab Results:  Recent Labs    02/06/19 0440 02/08/19 0509  WBC 9.6 8.7  HGB 12.5* 11.8*  HCT 38.9* 36.5*  PLT 262 275   BMET Recent Labs    02/06/19 0440 02/08/19 0509  NA 135 134*  K 4.9 4.3  CL 97* 98  CO2 28 26  GLUCOSE 129* 122*  BUN 13 19  CREATININE 1.11 1.09  CALCIUM 9.2 9.1   PT/INR No results for input(s): LABPROT, INR in the last 72 hours. ABG No results for input(s): PHART, HCO3 in the last 72 hours.  Invalid input(s): PCO2, PO2  Studies/Results: Dg Abd Portable 1v  Result Date: 02/07/2019 CLINICAL DATA:  58 year old male with abdominal distension EXAM: PORTABLE ABDOMEN - 1 VIEW COMPARISON:  Prior abdominal radiographs 02/05/2019 FINDINGS: Persistent gaseous distension of small bowel throughout the abdomen. The maximal diameter is 5.7 cm. A gastric tube remains present. The tip overlies the gastric antrum. Previously ingested contrast material opacifies the colon. The colon is not distended. A surgical drain projects over the anatomic pelvis. A staple line is present. The  lung bases are clear. IMPRESSION: 1. Persistent gaseous distension of small bowel in the mid abdomen. Contrast material is now present throughout the colon. Findings suggest partial small bowel obstruction versus postoperative small bowel ileus. 2. Well-positioned gastrostomy tube with the tip in the region of the gastric antrum. Electronically Signed   By: Malachy Moan M.D.   On: 02/07/2019 08:13   [place] Ekg Site Rite  Result Date: 02/07/2019 If Site Rite image not attached, placement could not be confirmed due to current cardiac rhythm.   Anti-infectives: Anti-infectives (From admission, onward)   Start     Dose/Rate Route Frequency Ordered Stop   02/01/19 0100  cefTRIAXone (ROCEPHIN) 2 g in sodium chloride 0.9 % 100 mL IVPB  Status:  Discontinued     2 g 200 mL/hr over 30 Minutes Intravenous Every 24 hours 01/31/19 0223 02/03/19 0855   01/31/19 0030  cefTRIAXone (ROCEPHIN) 2 g in sodium chloride 0.9 % 100 mL IVPB     2 g 200 mL/hr over 30 Minutes Intravenous  Once 01/31/19 0015 01/31/19 0240      Assessment/Plan: Bladder rupture - s/pExploratory laparotomy with repair of bladder rupture.22Fr foley. NG, JP, Dr. Annabell Howells, 04/13  Postop ileus - AXR 04/20 showed gaseous distention of sm bowel and colon; repeat films pending - NGT to ILWS, IVF until return of bowel function - NPO x ice chips -Dulcolax suppository - repeat today - encourage ambulation Add reglan  LOS: 9 days    Caleb Potter 02/08/2019

## 2019-02-08 NOTE — Progress Notes (Signed)
Pt alert and oriented x4. Pt verbalizes discomfort on abdominal , denies pain. Abdomen distended, bowel sounds hypoactive. Pt passing flatus. Denies n/v. NG tube remain in place. Suctioned discontinued prior to shift. Will communicate with treatment to possible place NG tube on suction to relieve discomfort. Urine remains bloody, no blood clots visible.

## 2019-02-08 NOTE — Progress Notes (Signed)
PHARMACY - ADULT TOTAL PARENTERAL NUTRITION CONSULT NOTE   Pharmacy Consult for TPN Indication: Prolonged Ileus  Patient Measurements: Height: '5\' 6"'$  (167.6 cm) Weight: 208 lb 15.9 oz (94.8 kg) IBW/kg (Calculated) : 63.8 TPN AdjBW (KG): 72.9 Body mass index is 33.73 kg/m.  Assessment:  58 y.o.malewith a history of HTN, CAD, and nephrolithiasis who presented to the [facility] ED with abdominal pain found to have intraperitoneal fluid, suspected hemorrhage on CT abd/pelvis with subsequent CT pyelogram demonstrating ruptured bladder and hyperkalemic AKI with creatinine 7.7, K 6.1. Urology performed ex lap with bladder repair 4/13. Abdominal XR and CT abdomen and pelvis with contrast shows persistent dilated loops of small bowel. Prolonged ileus.  GI: Abdomen distended per RN notes. High output - 4L NGT output on 4/19, not recorded for 4/20 but NGT not to suction >> NGT back to suction today. Last BM 4/20. +Flatus. No N/V. Pre-albumin 11.4. Reglan '10mg'$  IV q6h added 4/21. Endo: CBGs <180 Insulin requirements in the past 24 hours: 5 units Lytes: Na slightly low at 134; CorrCa upper end of goal 10.3; all others within normal limits Renal: Scr 1.09, BUN 19; UOP 0.5 ml/kg/hr Pulm: RA Cards: BP soft; ST  Hepatobil: AST/ALT increased 87/103, Alk Phos increased 153; Tbili up 3 <<1, TG 128 Neuro: Alert and oriented x4. Abdominal pain with distension. ID: WBC 9.6, afebrile, no antibiotics presently  TPN Access: PICC placement ordered 4/20 TPN start date: 4/20 Nutritional Goals (per RD recommendation on 4/): Kcal:  1900-2100 Protein:  100-115 grams Fluid:  1.9 - 2.1 L/day  Goal TPN rate is 85 ml/hr (This TPN provides 108 g of protein, 277 g of dextrose, and 70 g of lipids which provides 1999 kCals per day, meeting 100% of patient needs)  Current Nutrition:  NPO/ice chips D5NS at 85 ml/hr + LR at 100 ml/hr  Plan:  Increase TPN to 85 mL/hr. This TPN provides 108 g of protein, 277 g of dextrose,  and 70 g of lipids which provides 1999 kCals per day, meeting 100% of patient needs Electrolytes in TPN: continue standard for now Add MVI to TPN - trace elements are on back order will only place in TPN on Monday, Wednesday and Friday Add Thiamine and Folate to TPN Initiate moderate SSI and adjust as needed Decrease IVMF D5NS to 40 ml/hr when TPN hung at 1800. Continue LR at 100 ml/hr per MD Monitor TPN labs - will monitor liver function closely Monitor output and need to adjust IVF/Fluid goal  Sloan Leiter, PharmD, BCPS, BCCCP Clinical Pharmacist Please see AMION for all Pharmacists' Contact Phone Numbers 02/08/2019, 7:30 AM

## 2019-02-09 ENCOUNTER — Inpatient Hospital Stay (HOSPITAL_COMMUNITY): Payer: BLUE CROSS/BLUE SHIELD

## 2019-02-09 DIAGNOSIS — K9189 Other postprocedural complications and disorders of digestive system: Secondary | ICD-10-CM

## 2019-02-09 DIAGNOSIS — N3289 Other specified disorders of bladder: Secondary | ICD-10-CM

## 2019-02-09 DIAGNOSIS — K567 Ileus, unspecified: Secondary | ICD-10-CM

## 2019-02-09 LAB — GLUCOSE, CAPILLARY
Glucose-Capillary: 108 mg/dL — ABNORMAL HIGH (ref 70–99)
Glucose-Capillary: 126 mg/dL — ABNORMAL HIGH (ref 70–99)
Glucose-Capillary: 132 mg/dL — ABNORMAL HIGH (ref 70–99)
Glucose-Capillary: 133 mg/dL — ABNORMAL HIGH (ref 70–99)
Glucose-Capillary: 136 mg/dL — ABNORMAL HIGH (ref 70–99)
Glucose-Capillary: 140 mg/dL — ABNORMAL HIGH (ref 70–99)

## 2019-02-09 LAB — MAGNESIUM: Magnesium: 1.9 mg/dL (ref 1.7–2.4)

## 2019-02-09 LAB — COMPREHENSIVE METABOLIC PANEL
ALT: 81 U/L — ABNORMAL HIGH (ref 0–44)
AST: 52 U/L — ABNORMAL HIGH (ref 15–41)
Albumin: 2.4 g/dL — ABNORMAL LOW (ref 3.5–5.0)
Alkaline Phosphatase: 134 U/L — ABNORMAL HIGH (ref 38–126)
Anion gap: 9 (ref 5–15)
BUN: 16 mg/dL (ref 6–20)
CO2: 24 mmol/L (ref 22–32)
Calcium: 9.2 mg/dL (ref 8.9–10.3)
Chloride: 99 mmol/L (ref 98–111)
Creatinine, Ser: 0.89 mg/dL (ref 0.61–1.24)
GFR calc Af Amer: >60 mL/min (ref >60)
GFR calc non Af Amer: >60 mL/min (ref >60)
Glucose, Bld: 133 mg/dL — ABNORMAL HIGH (ref 70–99)
Potassium: 4.4 mmol/L (ref 3.5–5.1)
Sodium: 132 mmol/L — ABNORMAL LOW (ref 135–145)
Total Bilirubin: 1.6 mg/dL — ABNORMAL HIGH (ref 0.3–1.2)
Total Protein: 5.9 g/dL — ABNORMAL LOW (ref 6.5–8.1)

## 2019-02-09 LAB — PHOSPHORUS: Phosphorus: 3.5 mg/dL (ref 2.5–4.6)

## 2019-02-09 MED ORDER — TRAVASOL 10 % IV SOLN
INTRAVENOUS | Status: DC
  Administered 2019-02-09 (×2): via INTRAVENOUS
  Filled 2019-02-09: qty 1081.2

## 2019-02-09 NOTE — Progress Notes (Signed)
PROGRESS NOTE  Caleb Potter AVW:[id number] DOB: 1961-10-06 DOA: 01/30/2019 PCP: [name], MD  Brief History:  58 y.o.malewith a history of HTN, CAD, and nephrolithiasis who presented to the [facility] ED with abdominal pain found to have intraperitoneal fluid, suspected hemorrhage on CT abd/pelvis with subsequent CT pyelogram demonstrating ruptured bladder and hyperkalemic AKI with creatinine 7.7, K 6.1. Urology performed ex lap with bladder repair 4/13. IV fluids, lokelma, bicarbonate, and calcium gluconate were provided as well. Postoperatively renal function and electrolytes have normalized, though he has an ileus vs. partial SBO with NG tube placed. Abdominal XR and CT abdomen and pelvis with contrast shows persistent dilated loops of small bowel.  Pt started on IV TPN pending return of bowel function  Assessment/Plan: Post-op Ileus -appreciate general surgery follow up -200 cc NG output this am -remains NPO - repeat abd x-rays on 02/08/19 with generalized ileus -continue TPN -no BM today but passing flatus -diet advancement per surgery  AKI -due to BOO -resolved -hold lisinopril  Transaminasemia -hepatic steatosis and TPN -continue to trend--appears to be trending down -4/22 RUQ US--cholelithiasis without biliary ductal dilatation; hepatic steatosis  Bladder Rupture -Exploratory laparotomy with repair of bladder rupture.22Fr foley. NG, JP, Dr. Annabell Howells, 01/31/19  POD#8  Essential Hypertension -continue IV metoprolol until able to take po -holding lisinopril -controlled  COPD -stable -continue incruse and Breo  Alcohol abuse -last Etoh 4 days PTA -no signs of withdrawal  Coronary artery disease -no chest pain -resume ASA and statin one able to tolerate po  Hyperglycemia -likely due to TPN -check A1C      Disposition Plan:   Home in 2-3 days  Family Communication:   No Family at bedside  Consultants:general surgery; urology is primary     Code Status:  FULL   DVT Prophylaxis:  [place] Heparin   Procedures: As Listed in Progress Note Above  Antibiotics: None       Subjective: Patient denies fevers, chills, headache, chest pain, dyspnea, nausea, vomiting, diarrhea, abdominal pain, dysuria, hematuria, hematochezia, and melena.   Objective: Vitals:   02/09/19 0715 02/09/19 0717 02/09/19 1253 02/09/19 1617  BP:  105/71 103/79 108/81  Pulse:  80 (!) 101 79  Resp:  Temp:  98.4 F (36.9 C) 98.5 F (36.9 C) 98.5 F (36.9 C)  TempSrc:  Oral Oral Oral  SpO2: 93% 93% 96% 93%  Weight:      Height:        Intake/Output Summary (Last 24 hours) at 02/09/2019 1652 Last data filed at 02/09/2019 1518 Gross per 24 hour  Intake 2611.27 ml  Output 1925 ml  Net 686.27 ml   Weight change: 12.7 kg Exam:   General:  Pt is alert, follows commands appropriately, not in acute distress  HEENT: No icterus, No thrush, No neck mass, [place]/AT  Cardiovascular: RRR, S1/S2, no rubs, no gallops  Respiratory: bibasilar crackles. No wheeze  Abdomen: Soft/+BS, non tender, non distended, no guarding  Extremities: No edema, No lymphangitis, No petechiae, No rashes, no synovitis   Data Reviewed: I have personally reviewed following labs and imaging studies Basic Metabolic Panel: Recent Labs  Lab 02/03/19 0459 02/05/19 1206 02/06/19 0440 02/08/19 0509 02/09/19 0746  NA 135 135 135 134* 132*  K 4.2 4.7 4.9 4.3 4.4  CL 100 99 97* 98 99  CO2 GLUCOSE 116* 136* 129* 122* 133*  BUN 17 14  13 19 16   CREATININE 1.00 0.96 1.11 1.09 0.89  CALCIUM 8.6* 9.1 9.2 9.1 9.2  MG  --   --   --  1.9 1.9  PHOS  --   --   --  4.1 3.5   Liver Function Tests: Recent Labs  Lab 02/08/19 0509 02/09/19 0746  AST 87* 52*  ALT 103* 81*  ALKPHOS 153* 134*  BILITOT 3.0* 1.6*  PROT 6.2* 5.9*  ALBUMIN 2.5* 2.4*   No results for input(s): LIPASE, AMYLASE in the last 168 hours. No results for input(s): AMMONIA in the  last 168 hours. Coagulation Profile: No results for input(s): INR, PROTIME in the last 168 hours. CBC: Recent Labs  Lab 02/03/19 0459 02/05/19 1206 02/06/19 0440 02/08/19 0509  WBC 8.8 8.1 9.6 8.7  NEUTROABS  --   --   --  5.2  HGB 12.8* 12.2* 12.5* 11.8*  HCT 39.7 37.2* 38.9* 36.5*  MCV 98.8 97.4 98.7 97.6  PLT 184 222 262 275   Cardiac Enzymes: No results for input(s): CKTOTAL, CKMB, CKMBINDEX, TROPONINI in the last 168 hours. BNP: Invalid input(s): POCBNP CBG: Recent Labs  Lab 02/08/19 2005 02/09/19 0035 02/09/19 0338 02/09/19 1158 02/09/19 1543  GLUCAP 121* 136* 140* 126* 133*   HbA1C: No results for input(s): HGBA1C in the last 72 hours. Urine analysis:    Component Value Date/Time   COLORURINE YELLOW 01/31/2019 0312   APPEARANCEUR CLEAR 01/31/2019 0312   LABSPEC 1.015 01/31/2019 0312   PHURINE 5.0 01/31/2019 0312   GLUCOSEU 50 (A) 01/31/2019 0312   HGBUR LARGE (A) 01/31/2019 0312   BILIRUBINUR NEGATIVE 01/31/2019 0312   KETONESUR NEGATIVE 01/31/2019 0312   PROTEINUR NEGATIVE 01/31/2019 0312   NITRITE NEGATIVE 01/31/2019 0312   LEUKOCYTESUR NEGATIVE 01/31/2019 0312   Sepsis Labs: @LABRCNTIP (procalcitonin:4,lacticidven:4) ) Recent Results (from the past 240 hour(s))  MRSA PCR Screening     Status: None   Collection Time: 01/31/19  2:40 AM  Result Value Ref Range Status   MRSA by PCR NEGATIVE NEGATIVE Final    Comment:        The GeneXpert MRSA Assay (FDA approved for NASAL specimens only), is one component of a comprehensive MRSA colonization surveillance program. It is not intended to diagnose MRSA infection nor to guide or monitor treatment for MRSA infections. Performed at [facility], [address] [place], [place] [address]      Scheduled Meds:  bisacodyl  10 mg Rectal Once   finasteride  5 mg Oral Daily   fluticasone furoate-vilanterol  1 puff Inhalation Daily   heparin injection (subcutaneous)  5,000 Units Subcutaneous  Q8H   insulin aspart  0-15 Units Subcutaneous Q4H   metoCLOPramide (REGLAN) injection  10 mg Intravenous Q6H   metoprolol tartrate  5 mg Intravenous Q6H   sodium chloride flush  10-40 mL Intracatheter Q12H   umeclidinium bromide  1 puff Inhalation Daily   Continuous Infusions:  lactated ringers Stopped (02/07/19 1900)   TPN ADULT (ION) 85 mL/hr at 02/08/19 2300   TPN ADULT (ION)      Procedures/Studies: Ct Abdomen Pelvis Wo Contrast  Result Date: 02/04/2019 CLINICAL DATA:  Bowel obstruction versus ileus. Bladder rupture on Sunday. Laparotomy 01/30/2019 with bladder repair. EXAM: CT ABDOMEN AND PELVIS WITHOUT CONTRAST TECHNIQUE: Multidetector CT imaging of the abdomen and pelvis was performed following the standard protocol without IV contrast. COMPARISON:  Plain films 02/04/2019.  Most recent CT 01/30/2019 FINDINGS: Lower chest: Right base subsegmental atelectasis. Normal heart size  without pericardial or pleural effusion. Multivessel coronary artery atherosclerosis. Hepatobiliary: Mild hepatic steatosis. Normal gallbladder, without biliary ductal dilatation. Pancreas: Normal, without mass or ductal dilatation. Spleen: Normal in size, without focal abnormality. Adrenals/Urinary Tract: Normal adrenal glands. 3 mm interpolar right renal collecting system calculus. No hydronephrosis. Foley catheter within the urinary bladder. Stomach/Bowel: Nasogastric tube terminates at the gastric body. The colon is normal in caliber. The terminal ileum is normal, including on image 60/3. Proximal and mid small bowel loops are dilated, including at up to 5.6 cm on image 50/3. The mid to distal ileum undergoes a gradual transition to normal caliber, without focal abnormality. No findings to suggest ischemia. Vascular/Lymphatic: Aortic and branch vessel atherosclerosis. No abdominopelvic adenopathy. Reproductive: Mild prostatomegaly. Other: A surgical drain from a left pelvic approach terminates in the  cul-de-sac. No residual abdominopelvic fluid. No free intraperitoneal air. Musculoskeletal: Moderate left-sided gynecomastia. Lumbosacral spondylosis. IMPRESSION: 1. Relatively diffuse small bowel dilatation, without focal transition point. Findings are most consistent with postoperative adynamic ileus. 2. Right nephrolithiasis. 3.  Aortic Atherosclerosis (ICD10-I70.0). 4. Hepatic steatosis. 5. Left gynecomastia. Electronically Signed   By: Jeronimo Greaves M.D.   On: 02/04/2019 14:41   Ct Abdomen Pelvis Wo Contrast  Result Date: 01/30/2019 CLINICAL DATA:  Gross hematuria.  Bilateral flank pain after fall. EXAM: CT ABDOMEN AND PELVIS WITHOUT CONTRAST TECHNIQUE: Multidetector CT imaging of the abdomen and pelvis was performed following the standard protocol without IV contrast. COMPARISON:  CT scan of January 26, 2019. FINDINGS: Lower chest: No acute abnormality. Hepatobiliary: No gallstones or biliary dilatation is noted. Mild amount of fluid is noted around the liver with dense material seen posteriorly consistent with hematoma. Pancreas: Unremarkable. No pancreatic ductal dilatation or surrounding inflammatory changes. Spleen: Fluid is noted around the spleen which was not present on prior exam and is concerning for hemorrhage. Adrenals/Urinary Tract: Adrenal glands appear normal. Nonobstructive right renal calculus is noted. No hydronephrosis or renal obstruction is noted. The amount of hemorrhage seen in urinary bladder on prior exam is significantly decreased, although residual hemorrhage does remain in the dependent portion of the bladder. Stomach/Bowel: The stomach appears normal. There is no evidence of bowel obstruction or inflammation. The appendix is not clearly visualized. Vascular/Lymphatic: Aortic atherosclerosis. No enlarged abdominal or pelvic lymph nodes. Reproductive: Stable mild prostatic enlargement is noted. Other: There does appear to be a moderate amount of hemorrhage present in the dependent  portion of the pelvis as well as in the left pericolic gutter. Musculoskeletal: No acute or significant osseous findings. IMPRESSION: There is interval development of a moderate amount of intraperitoneal hemorrhage seen in the pelvis, both pericolic gutters, and around the liver and spleen. Critical Value/emergent results were called by telephone at the time of interpretation on 01/30/2019 at 8:29 pm to Dr. Terance Hart , who verbally acknowledged these results. The large amount of hemorrhage seen within the urinary bladder on prior exam has significantly improved, with only a mild amount of residual hemorrhage or thrombus seen posteriorly in the urinary bladder on current exam. Nonobstructive right renal calculus. No hydronephrosis or renal obstruction is noted. Stable mild prostatic enlargement. Electronically Signed   By: Lupita Raider, M.D.   On: 01/30/2019 20:30   Dg Abd 1 View  Result Date: 02/01/2019 CLINICAL DATA:  Enteric tube placement EXAM: ABDOMEN - 1 VIEW COMPARISON:  None. FINDINGS: The tip of the enteric tube is at the expected location of the gastroduodenal junction or first portion of the duodenum. The side port is  still likely within the stomach. There are multiple dilated loops of bowel throughout the abdomen. IMPRESSION: Enteric tube tip near the gastroduodenal junction. Electronically Signed   By: Deatra Robinson M.D.   On: 02/01/2019 14:09   Ct Pelvis Wo Contrast  Result Date: 01/30/2019 CLINICAL DATA:  Considerable free fluid within the abdomen on recent CT, request is been made for CT cystography. EXAM: CT PELVIS WITHOUT CONTRAST TECHNIQUE: Multidetector CT imaging of the pelvis was performed following the standard protocol without intravenous contrast. COMPARISON:  CT from earlier in the same day. FINDINGS: Urinary Tract: Contrast material is been instilled through the Foley catheter. The Foley catheter balloon is well visualized. The bladder distends well and demonstrates diffuse  intraluminal filling defect consistent with residual thrombus. Additionally there is extravasation of contrast material from the dome of the bladder in a linear fashion which extends for approximately 2.9 cm consistent with bladder rupture. Contrast enhancement of the intraperitoneal fluid seen on the recent CT examination consistent with the extravasation. Bowel:  Bowel is again within normal limits. Vascular/Lymphatic: Atherosclerotic calcifications are seen. No lymphadenopathy is noted. Reproductive: Prostate is mildly prominent but stable from the recent exam. Other: Free fluid is again noted within the abdomen and pelvis related to the bladder perforation. Musculoskeletal: Stable in appearance when compare with the prior CT. IMPRESSION: CT cystography reveals a long segment of bladder discontinuity along the dome consistent with rupture. Passage of contrast material into the peritoneal cavity is seen. Retained thrombus within the bladder is noted. Electronically Signed   By: Alcide Clever M.D.   On: 01/30/2019 23:20   Dg Chest Portable 1 View  Result Date: 01/30/2019 CLINICAL DATA:  Chest pain EXAM: PORTABLE CHEST 1 VIEW COMPARISON:  11/03/2014 FINDINGS: Cardiac shadows within normal limits. Tortuosity of the thoracic aorta is identified somewhat accentuated by the portable technique and patient rotation. The lungs are clear. No acute bony abnormality is noted. IMPRESSION: No acute abnormality noted. Electronically Signed   By: Alcide Clever M.D.   On: 01/30/2019 19:55   Dg Abd Acute 2+v W 1v Chest  Result Date: 02/08/2019 CLINICAL DATA:  Followup postoperative ileus. Persistent abdominal pain and abdominal distension. EXAM: DG ABDOMEN ACUTE W/ 1V CHEST COMPARISON:  02/05/2019 and earlier, including CT abdomen and pelvis 02/04/2019 and earlier. Chest x-ray 01/30/2019 and earlier. FINDINGS: Marked gaseous distension of the small bowel diffusely and moderate gaseous distention of the ascending and  transverse colon has not significantly changed over the past 6 days. The descending colon, sigmoid colon and rectum are decompressed currently. Contrast material from the CT is present throughout the colon. No evidence of free intraperitoneal air on the [employer]. Nasogastric to courses through the stomach with its tip in the proximal duodenum as noted on the recent CT. Cardiac silhouette upper normal in size, unchanged. Lungs remain clear. RIGHT arm PICC tip projects over the LOWER SVC. IMPRESSION: 1. Stable severe generalized ileus (favored over small bowel obstruction). No free intraperitoneal air. 2. No acute cardiopulmonary disease. Electronically Signed   By: Hulan Saas M.D.   On: 02/08/2019 09:46   Dg Abd Portable 1v  Result Date: 02/07/2019 CLINICAL DATA:  58 year old male with abdominal distension EXAM: PORTABLE ABDOMEN - 1 VIEW COMPARISON:  Prior abdominal radiographs 02/05/2019 FINDINGS: Persistent gaseous distension of small bowel throughout the abdomen. The maximal diameter is 5.7 cm. A gastric tube remains present. The tip overlies the gastric antrum. Previously ingested contrast material opacifies the colon. The colon is not distended. A  surgical drain projects over the anatomic pelvis. A staple line is present. The lung bases are clear. IMPRESSION: 1. Persistent gaseous distension of small bowel in the mid abdomen. Contrast material is now present throughout the colon. Findings suggest partial small bowel obstruction versus postoperative small bowel ileus. 2. Well-positioned gastrostomy tube with the tip in the region of the gastric antrum. Electronically Signed   By: Malachy Moan M.D.   On: 02/07/2019 08:13   Dg Abd Portable 1v  Result Date: 02/05/2019 CLINICAL DATA:  Postoperative ileus. Hx of recent repair of ruptured bladder. Pt denies abdominal pain and constipation, stating he has only had ice and water to eat and drink and doesn't expect to be able to have a bowel movement  without food. EXAM: PORTABLE ABDOMEN - 1 VIEW COMPARISON:  None. FINDINGS: There is a nasogastric tube with the tip projecting over the antrum of the stomach/pylorus. There is gaseous distension of small bowel and colon most consistent with a postoperative ileus. There is no evidence of pneumoperitoneum, portal venous gas or pneumatosis. There are no pathologic calcifications along the expected course of the ureters. The osseous structures are unremarkable. IMPRESSION: 1. Nasogastric tube with the tip projecting over the antrum of the stomach/pylorus. 2. Persistent gaseous distension of the small bowel and colon consistent with postoperative ileus. Electronically Signed   By: Elige Ko   On: 02/05/2019 13:35   Dg Abd Portable 1v  Result Date: 02/04/2019 CLINICAL DATA:  Ileus. EXAM: PORTABLE ABDOMEN - 1 VIEW COMPARISON:  One-view abdomen 02/01/2019 FINDINGS: Multiple dilated loops of small bowel are again noted. NG tube is in place. The stomach is decompressed. Overall bowel dilation is improved. IMPRESSION: 1. Improving dilation of small bowel. Electronically Signed   By: Marin Roberts M.D.   On: 02/04/2019 08:35   Dg Abd Portable 1v  Result Date: 02/01/2019 CLINICAL DATA:  Abdominal distension. EXAM: PORTABLE ABDOMEN - 1 VIEW COMPARISON:  CT scan January 30, 2019 FINDINGS: Lung bases are normal. No free air, portal venous gas, or pneumatosis on the supine view. Dilated loops of small bowel are identified. There is also air seen within the ascending and transverse colon which are mildly prominent. The dilated small bowel loops measure up to 5.2 cm. The transverse colon measures 6.6 cm. No other acute abnormalities. IMPRESSION: 1. Dilated loops of small bowel and mildly prominent loops of colon. The small bowel appears to be dilated out of proportion to the colon suggesting early or partial small bowel obstruction. Given the amount of air in the colon, a developing ileus is also a possibility.  Recommend clinical correlation. A CT scan could further evaluate if clinically warranted. Otherwise, recommend attention on close follow-up. These results will be called to the ordering clinician or representative by the Radiologist Assistant, and communication documented in the PACS or zVision Dashboard. Electronically Signed   By: Gerome Sam III M.D   On: 02/01/2019 08:18   [place] Ekg Site Rite  Result Date: 02/07/2019 If Site Rite image not attached, placement could not be confirmed due to current cardiac rhythm.  US Abdomen Limited Ruq  Result Date: 02/09/2019 CLINICAL DATA:  General abdominal pain. EXAM: ULTRASOUND ABDOMEN LIMITED RIGHT UPPER QUADRANT COMPARISON:  CT 02/04/2019 FINDINGS: Gallbladder: Echogenic foci in the gallbladder suggestive for small stones and sludge. No gallbladder wall thickening. Patient does not have a sonographic Murphy sign. Common bile duct: Diameter: 0.4 cm Liver: Liver is mildly heterogeneous. No focal lesion. Overall, increased echogenicity in the liver compared  to the adjacent right kidney. Portal vein is patent on color Doppler imaging with normal direction of blood flow towards the liver. IMPRESSION: 1. Echogenic material in the gallbladder is suggestive for small stones and sludge. No sonographic evidence for acute cholecystitis. No biliary dilatation. 2. Liver is mildly echogenic and heterogeneous. Findings could be associated with steatosis. Electronically Signed   By: Richarda Overlie M.D.   On: 02/09/2019 08:22    Catarina Hartshorn, DO  [facility] Pager [phone]  If 7PM-7AM, please contact night-coverage [email or web address] Password [email or web address] 02/09/2019, 4:52 PM   LOS: 10 days

## 2019-02-09 NOTE — Progress Notes (Signed)
PHARMACY - ADULT TOTAL PARENTERAL NUTRITION CONSULT NOTE   Pharmacy Consult for TPN Indication: Prolonged Ileus  Patient Measurements: Height: 5' 6" (167.6 cm) Weight: 218 lb 0.6 oz (98.9 kg) IBW/kg (Calculated) : 63.8 TPN AdjBW (KG): 72.9 Body mass index is 35.19 kg/m.  Assessment:  58 y.o.malewith a history of HTN, CAD, and nephrolithiasis who presented to the [facility] ED with abdominal pain found to have intraperitoneal fluid, suspected hemorrhage on CT abd/pelvis with subsequent CT pyelogram demonstrating ruptured bladder and hyperkalemic AKI with creatinine 7.7, K 6.1. Urology performed ex lap with bladder repair 4/13. Abdominal XR and CT abdomen and pelvis with contrast shows persistent dilated loops of small bowel. Prolonged ileus.  GI: Ileus stable on xray 4/21. NGT back to suction - 150 mL output. Last BM 4/20. +Flatus. No N/V. Pre-albumin 11.4. Reglan 65m IV q6h added 4/21. Endo: CBGs <180 Insulin requirements in the past 24 hours: 10 units Lytes: Na low at 132; CorrCa elevated at 10.5 (Alb 2.4); all others within normal limits Renal: Scr 1.09, BUN 19; UOP 0.7 ml/kg/hr (+ additional unmeasured) Pulm: RA Cards: BP soft, HR improved, NSR Hepatobil: Hx hepatic steatosis and alcohol abuse. AST/ALT decreasing (52/81), Alk Phos decreased 134; Tbili down 1.6, TG 128 -[place] no acute cholecystitis Neuro: Alert and oriented x4. Abdominal pain with distension. ID: WBC within normal limits, afebrile, no antibiotics presently  TPN Access: PICC placement ordered 4/20 TPN start date: 4/20 Nutritional Goals (per RD recommendation on 4/20): Kcal:  1900-2100 Protein:  100-115 grams Fluid:  1.9 - 2.1 L/day  Goal TPN rate is 85 ml/hr (This TPN provides 108 g of protein, 277 g of dextrose, and 70 g of lipids which provides 1999 kCals per day, meeting 100% of patient needs)  Current Nutrition:  NPO/ice chips D5NS at 40 ml/hr + LR at 100 ml/hr  Plan:  Continue TPN at 85 mL/hr. This TPN  provides 108 g of protein, 277 g of dextrose, and 70 g of lipids which provides 1999 kCals per day, meeting 100% of patient needs Electrolytes in TPN: increase Na and slightly increase Mag, lower Ca; others standard Add MVI to TPN - trace elements are on back order will only place in TPN on Monday, Wednesday and Friday Add Thiamine and Folate to TPN Continue moderate SSI and adjust as needed Change D5NS to KVO Continue  LR at 100 ml/hr per MD Monitor TPN labs - will monitor liver function closely Monitor output and need to adjust IVF/Fluid goal  JSloan Leiter PharmD, BCPS, BCCCP Clinical Pharmacist Please see AMION for all Pharmacists' Contact Phone Numbers 02/09/2019, 7:43 AM

## 2019-02-09 NOTE — Progress Notes (Signed)
Physical Therapy Treatment Patient Details Name: Caleb Potter MRN: [id number] DOB: [birth year] Today's Date: 02/09/2019    History of Present Illness Patient is a 58 yo male who came in for fall with significant abdominal pain found to have free fluid in abdomen and is now s/p X-lap with repair of the bladder rupture and drainage of the intraperitoneal urine. PMH including but not limited to CAD, HTN, MI.    PT Comments    Pt presented supine in bed with HOB elevated, awake and willing to participate in therapy session. Pt making steady progress with functional mobility. He tolerated ambulation in hallway without an AD, no LOB or need for physical assistance. PT will continue to follow pt acutely.     Follow Up Recommendations  No PT follow up     Equipment Recommendations  None recommended by PT    Recommendations for Other Services       Precautions / Restrictions Precautions Precautions: Fall Precaution Comments: NGT Restrictions Weight Bearing Restrictions: No    Mobility  Bed Mobility Overal bed mobility: Needs Assistance Bed Mobility: Supine to Sit     Supine to sit: Min assist     General bed mobility comments: min A for trunk elevation to achieve upright sitting at EOB  Transfers Overall transfer level: Needs assistance Equipment used: None Transfers: Sit to/from Stand Sit to Stand: Supervision         General transfer comment: for safety  Ambulation/Gait Ambulation/Gait assistance: Supervision;Min guard Gait Distance (Feet): 300 Feet Assistive device: None Gait Pattern/deviations: Step-through pattern;Decreased stride length;Drifts right/left Gait velocity: decreased   General Gait Details: pt with mild instability but no overt LOB or need for physical assistance, min guard to supervision level   Stairs             Wheelchair Mobility    Modified Rankin (Stroke Patients Only)       Balance Overall balance assessment: History of  Falls;Needs assistance Sitting-balance support: Feet supported Sitting balance-Leahy Scale: Good     Standing balance support: No upper extremity supported Standing balance-Leahy Scale: Good                              Cognition Arousal/Alertness: Awake/alert Behavior During Therapy: WFL for tasks assessed/performed Overall Cognitive Status: Within Functional Limits for tasks assessed                                        Exercises      General Comments        Pertinent Vitals/Pain Pain Assessment: No/denies pain    Home Living                      Prior Function            PT Goals (current goals can now be found in the care plan section) Acute Rehab PT Goals PT Goal Formulation: With patient Time For Goal Achievement: 02/21/19 Potential to Achieve Goals: Good Progress towards PT goals: Progressing toward goals    Frequency    Min 5X/week      PT Plan Current plan remains appropriate    Co-evaluation              AM-PAC PT "6 Clicks" Mobility   Outcome Measure  Help needed turning from your back to  your side while in a flat bed without using bedrails?: None Help needed moving from lying on your back to sitting on the side of a flat bed without using bedrails?: None Help needed moving to and from a bed to a chair (including a wheelchair)?: None Help needed standing up from a chair using your arms (e.g., wheelchair or bedside chair)?: A Little Help needed to walk in hospital room?: A Little Help needed climbing 3-5 steps with a railing? : A Little 6 Click Score: 21    End of Session Equipment Utilized During Treatment: Gait belt Activity Tolerance: Patient tolerated treatment well Patient left: in chair;with call bell/phone within reach Nurse Communication: Mobility status PT Visit Diagnosis: History of falling (Z91.81)     Time: 0177-9390 PT Time Calculation (min) (ACUTE ONLY): 32 min  Charges:   $Gait Training: 8-22 mins $Therapeutic Activity: 8-22 mins                     Deborah Chalk, PT, DPT  Acute Rehabilitation Services Pager [phone] Office [phone]     Caleb Potter 02/09/2019, 12:07 PM

## 2019-02-09 NOTE — Progress Notes (Signed)
10 Days Post-Op    CC:  Post op ileus  Subjective: Pt just back from radiology RUQ [place].  NG hooked up most of the day yesterday, but not much drainage.  300 in cannister.  I placed him back on suction and irrigated the NG, I got about 150 from it 50 was irrigation. Replaced air filter on sump.   It is clear and looks like ice chips.  He is having some flatus, no  BM, says he feels a little softer than yesterday.  He is up walking some.  Lab is drawing labs now.  Objective: Vital signs in last 24 hours: Temp:  [97.5 F (36.4 C)-98.6 F (37 C)] 98.2 F (36.8 C) (04/22 0348) Pulse Rate:  [78-112] 78 (04/22 0348) Resp:  [15-26] 17 (04/22 0348) BP: (92-120)/(62-83) 102/72 (04/22 0348) SpO2:  [93 %-99 %] 93 % (04/22 0717) Weight:  [98.9 kg] 98.9 kg (04/22 0348) Last BM Date: 02/07/19 NPO 1375 IV 1575 urine No NG drainage recorded Afebrile VSS, Tachycardia better Last labs 4/21 Acute abd 4/21:Stable severe generalized ileus (favored over small bowel obstruction). No free intraperitoneal air.Contrast from the CT 4/17 in the colon. 2. No acute cardiopulmonary disease Intake/Output from previous day: 04/21 0701 - 04/22 0700 In: 1375.8 [I.V.:1375.8] Out: 1575 [Urine:1575] Intake/Output this shift: No intake/output data recorded.  General appearance: alert, cooperative and no distress GI: large distended abdomen, few high pitched BS, hypoactive, + flatus, no BM  Lab Results:  Recent Labs    02/08/19 0509  WBC 8.7  HGB 11.8*  HCT 36.5*  PLT 275    BMET Recent Labs    02/08/19 0509  NA 134*  K 4.3  CL 98  CO2 26  GLUCOSE 122*  BUN 19  CREATININE 1.09  CALCIUM 9.1   PT/INR No results for input(s): LABPROT, INR in the last 72 hours.  Recent Labs  Lab 02/08/19 0509  AST 87*  ALT 103*  ALKPHOS 153*  BILITOT 3.0*  PROT 6.2*  ALBUMIN 2.5*     Lipase  No results found for: LIPASE   Medications: . bisacodyl  10 mg Rectal Once  . finasteride  5 mg Oral Daily   . fluticasone furoate-vilanterol  1 puff Inhalation Daily  . heparin injection (subcutaneous)  5,000 Units Subcutaneous Q8H  . insulin aspart  0-15 Units Subcutaneous Q4H  . metoCLOPramide (REGLAN) injection  10 mg Intravenous Q6H  . metoprolol tartrate  5 mg Intravenous Q6H  . sodium chloride flush  10-40 mL Intracatheter Q12H  . umeclidinium bromide  1 puff Inhalation Daily   . dextrose 5 % and 0.9% NaCl 40 mL/hr at 02/09/19 0106  . lactated ringers Stopped (02/07/19 1900)  . TPN ADULT (ION) 85 mL/hr at 02/08/19 2300    Assessment/Plan  Clot retention with intraperitoneal bladder rupture and AKI Bladder rupture Exploratory laparotomy with repair of bladder rupture.22Fr foley. NG, JP, Dr. Annabell Howells, 01/31/19  POD#8  Postop ileus - AXR 04/20showed gaseous distention of sm bowel and colon - NGT, IVF until return of bowel function - encourage ambulation  Moderate Malnutrition - Prealbumin 11.4  Elevated LFT's  - RUQ [place] and labs pending for today  FEN:  IV fluids/TPN/NPO ID:  Rocephin 4/13-4/16/20 DVT:  Heparin Follow up: TBD  Plan:  Ice chips and sips, continue to ambulate.  Will review labs/US when completed.    LOS: 10 days    Caleb Potter 02/09/2019 [phone]

## 2019-02-09 NOTE — Progress Notes (Signed)
10 Days Post-Op  Subjective: Caleb Potter failed a trial of NG clamping.  He is doing better today.  When I saw him the tube was clamped in preparation for transport to [place].  He had no nausea and reports further flatus.  He has been ambulating.  He has some LFT elevation and is for a liver UA today.  He is now on TPN.  ROS:  Review of Systems  Constitutional: Negative for chills and fever.  Respiratory: Negative for shortness of breath.   Cardiovascular: Negative for chest pain.  Gastrointestinal: Negative for nausea and vomiting.    Anti-infectives: Anti-infectives (From admission, onward)   Start     Dose/Rate Route Frequency Ordered Stop   02/01/19 0100  cefTRIAXone (ROCEPHIN) 2 g in sodium chloride 0.9 % 100 mL IVPB  Status:  Discontinued     2 g 200 mL/hr over 30 Minutes Intravenous Every 24 hours 01/31/19 0223 02/03/19 0855   01/31/19 0030  cefTRIAXone (ROCEPHIN) 2 g in sodium chloride 0.9 % 100 mL IVPB     2 g 200 mL/hr over 30 Minutes Intravenous  Once 01/31/19 0015 01/31/19 0240      Current Facility-Administered Medications  Medication Dose Route Frequency Provider Last Rate Last Dose  . acetaminophen (TYLENOL) tablet 650 mg  650 mg Oral Q6H PRN Opyd, Lavone Neri, MD       Or  . acetaminophen (TYLENOL) suppository 650 mg  650 mg Rectal Q6H PRN Opyd, Lavone Neri, MD      . bisacodyl (DULCOLAX) suppository 10 mg  10 mg Rectal Once Manus Rudd, MD      . dextrose 5 %-0.9 % sodium chloride infusion   Intravenous Continuous Link Snuffer B, RPH 10 mL/hr at 02/09/19 1003    . diphenhydrAMINE (BENADRYL) injection 12.5-25 mg  12.5-25 mg Intravenous Q6H PRN Bjorn Pippin, MD       Or  . diphenhydrAMINE (BENADRYL) 12.5 MG/5ML elixir 12.5-25 mg  12.5-25 mg Oral Q6H PRN Bjorn Pippin, MD      . finasteride (PROSCAR) tablet 5 mg  5 mg Oral Daily Bjorn Pippin, MD   5 mg at 02/09/19 1002  . fluticasone furoate-vilanterol (BREO ELLIPTA) 100-25 MCG/INH 1 puff  1 puff Inhalation Daily Bjorn Pippin, MD    1 puff at 02/09/19 0715  . heparin injection 5,000 Units  5,000 Units Subcutaneous Q8H Bjorn Pippin, MD   5,000 Units at 02/09/19 0535  . HYDROcodone-acetaminophen (NORCO/VICODIN) 5-325 MG per tablet 1-2 tablet  1-2 tablet Oral Q4H PRN Opyd, Lavone Neri, MD      . HYDROmorphone (DILAUDID) injection 0.5-1 mg  0.5-1 mg Intravenous Q2H PRN Bjorn Pippin, MD   1 mg at 02/02/19 2104  . insulin aspart (novoLOG) injection 0-15 Units  0-15 Units Subcutaneous Q4H [facility] Mater, [facility]   2 Units at 02/09/19 1244  . lactated ringers infusion   Intravenous Continuous Sherryll Burger, Pratik D, DO   Stopped at 02/07/19 1900  . LORazepam (ATIVAN) injection 1 mg  1 mg Intravenous Q6H PRN Shon Hale, MD   1 mg at 02/08/19 0847  . menthol-cetylpyridinium (CEPACOL) lozenge 3 mg  1 lozenge Oral PRN Focht, Jessica L, PA   3 mg at 02/08/19 1453  . metoCLOPramide (REGLAN) injection 10 mg  10 mg Intravenous Q6H Manus Rudd, MD   10 mg at 02/09/19 1244  . metoprolol tartrate (LOPRESSOR) injection 5 mg  5 mg Intravenous Q6H Emokpae, Courage, MD   5 mg at 02/09/19 1244  . morphine 4  MG/ML injection 4 mg  4 mg Intravenous Q4H PRN Opyd, Lavone Neriimothy S, MD      . ondansetron (ZOFRAN) tablet 4 mg  4 mg Oral Q6H PRN Opyd, Lavone Neriimothy S, MD       Or  . ondansetron (ZOFRAN) injection 4 mg  4 mg Intravenous Q6H PRN Opyd, Lavone Neriimothy S, MD   4 mg at 02/01/19 1237  . oxyCODONE (Oxy IR/ROXICODONE) immediate release tablet 5 mg  5 mg Oral Q4H PRN Bjorn PippinWrenn, Aljean Horiuchi, MD      . senna-docusate (Senokot-S) tablet 1 tablet  1 tablet Oral QHS PRN Bjorn PippinWrenn, Josmar Messimer, MD      . sodium chloride flush (NS) 0.9 % injection 10-40 mL  10-40 mL Intracatheter Q12H Bjorn PippinWrenn, Morgan Keinath, MD   10 mL at 02/09/19 1004  . sodium chloride flush (NS) 0.9 % injection 10-40 mL  10-40 mL Intracatheter PRN Bjorn PippinWrenn, Merland Holness, MD      . sodium phosphate (FLEET) 7-19 GM/118ML enema 1 enema  1 enema Rectal Once PRN Bjorn PippinWrenn, Keyanni Whittinghill, MD      . TPN ADULT (ION)   Intravenous Continuous TPN Quenton FetterMillen, Jessica B, RPH 85  mL/hr at 02/08/19 2300    . TPN ADULT (ION)   Intravenous Continuous TPN Quenton FetterMillen, Jessica B, RPH      . umeclidinium bromide (INCRUSE ELLIPTA) 62.5 MCG/INH 1 puff  1 puff Inhalation Daily Bjorn PippinWrenn, Vidhi Delellis, MD   1 puff at 02/09/19 0716  . zolpidem (AMBIEN) tablet 5 mg  5 mg Oral QHS PRN,MR X 1 Bjorn PippinWrenn, Loran Auguste, MD         Objective: Vital signs in last 24 hours: Temp:  [97.5 F (36.4 C)-98.6 F (37 C)] 98.5 F (36.9 C) (04/22 1253) Pulse Rate:  [78-101] 101 (04/22 1253) Resp:  [16-18] 16 (04/22 1253) BP: (92-105)/(62-79) 103/79 (04/22 1253) SpO2:  [93 %-99 %] 96 % (04/22 1253) Weight:  [98.9 kg] 98.9 kg (04/22 0348)  Intake/Output from previous day: 04/21 0701 - 04/22 0700 In: 1425.8 [I.V.:1375.8; NG/GT:50] Out: 1725 [Urine:1575; Emesis/NG output:150] Intake/Output this shift: Total I/O In: -  Out: 200 [Emesis/NG output:200]   Physical Exam Vitals signs reviewed.  Constitutional:      Appearance: Normal appearance. He is obese.  Cardiovascular:     Rate and Rhythm: Regular rhythm. Tachycardia present.  Pulmonary:     Effort: Pulmonary effort is normal. No respiratory distress.     Breath sounds: Normal breath sounds.  Abdominal:     Comments: Obese, NT with +BS.  Wound intact without erythema or drainage.    Musculoskeletal: Normal range of motion.        General: No swelling or tenderness.  Skin:    General: Skin is warm and dry.  Neurological:     Mental Status: He is alert.     Lab Results:  Recent Labs    02/08/19 0509  WBC 8.7  HGB 11.8*  HCT 36.5*  PLT 275   BMET Recent Labs    02/08/19 0509 02/09/19 0746  NA 134* 132*  K 4.3 4.4  CL 98 99  CO2 26 24  GLUCOSE 122* 133*  BUN 19 16  CREATININE 1.09 0.89  CALCIUM 9.1 9.2   PT/INR No results for input(s): LABPROT, INR in the last 72 hours. ABG No results for input(s): PHART, HCO3 in the last 72 hours.  Invalid input(s): PCO2, PO2  Studies/Results: Dg Abd Acute 2+v W 1v Chest  Result Date:  02/08/2019 CLINICAL DATA:  Followup postoperative ileus. Persistent abdominal pain  and abdominal distension. EXAM: DG ABDOMEN ACUTE W/ 1V CHEST COMPARISON:  02/05/2019 and earlier, including CT abdomen and pelvis 02/04/2019 and earlier. Chest x-ray 01/30/2019 and earlier. FINDINGS: Marked gaseous distension of the small bowel diffusely and moderate gaseous distention of the ascending and transverse colon has not significantly changed over the past 6 days. The descending colon, sigmoid colon and rectum are decompressed currently. Contrast material from the CT is present throughout the colon. No evidence of free intraperitoneal air on the [employer]. Nasogastric to courses through the stomach with its tip in the proximal duodenum as noted on the recent CT. Cardiac silhouette upper normal in size, unchanged. Lungs remain clear. RIGHT arm PICC tip projects over the LOWER SVC. IMPRESSION: 1. Stable severe generalized ileus (favored over small bowel obstruction). No free intraperitoneal air. 2. No acute cardiopulmonary disease. Electronically Signed   By: Hulan Saas M.D.   On: 02/08/2019 09:46   US Abdomen Limited Ruq  Result Date: 02/09/2019 CLINICAL DATA:  General abdominal pain. EXAM: ULTRASOUND ABDOMEN LIMITED RIGHT UPPER QUADRANT COMPARISON:  CT 02/04/2019 FINDINGS: Gallbladder: Echogenic foci in the gallbladder suggestive for small stones and sludge. No gallbladder wall thickening. Patient does not have a sonographic Murphy sign. Common bile duct: Diameter: 0.4 cm Liver: Liver is mildly heterogeneous. No focal lesion. Overall, increased echogenicity in the liver compared to the adjacent right kidney. Portal vein is patent on color Doppler imaging with normal direction of blood flow towards the liver. IMPRESSION: 1. Echogenic material in the gallbladder is suggestive for small stones and sludge. No sonographic evidence for acute cholecystitis. No biliary dilatation. 2. Liver is mildly echogenic and  heterogeneous. Findings could be associated with steatosis. Electronically Signed   By: Richarda Overlie M.D.   On: 02/09/2019 08:22     Assessment and Plan:  10 Days Post-Op Procedure(s) (LRB): EXPLORATORY LAPAROTOMY (N/A) Bladder Repair  Bladder rupture:        Maintain foley, flomax, and finasteride.         Oxybutynin which was ordered prn spasm was discontinued.    Ileus:       improving slowly.        BS and distention improved today.         Amb as much as possible.  Continue PT       Continue TPN, IVF, NG until return of bowel function     LOS: 10 days    Bjorn Pippin 02/09/2019 [phone] ID: Ray Church, male   DOB: 11/27/1960, 58 y.o.   MRN: [id number]

## 2019-02-09 NOTE — Progress Notes (Signed)
Pt ambulated in hallways 274ft with NT supervision. Dionne Bucy RN

## 2019-02-10 LAB — COMPREHENSIVE METABOLIC PANEL
ALT: 76 U/L — ABNORMAL HIGH (ref 0–44)
AST: 43 U/L — ABNORMAL HIGH (ref 15–41)
Albumin: 2.5 g/dL — ABNORMAL LOW (ref 3.5–5.0)
Alkaline Phosphatase: 153 U/L — ABNORMAL HIGH (ref 38–126)
Anion gap: 7 (ref 5–15)
BUN: 17 mg/dL (ref 6–20)
CO2: 27 mmol/L (ref 22–32)
Calcium: 9.4 mg/dL (ref 8.9–10.3)
Chloride: 100 mmol/L (ref 98–111)
Creatinine, Ser: 0.89 mg/dL (ref 0.61–1.24)
GFR calc Af Amer: >60 mL/min (ref >60)
GFR calc non Af Amer: >60 mL/min (ref >60)
Glucose, Bld: 137 mg/dL — ABNORMAL HIGH (ref 70–99)
Potassium: 4.5 mmol/L (ref 3.5–5.1)
Sodium: 134 mmol/L — ABNORMAL LOW (ref 135–145)
Total Bilirubin: 1.2 mg/dL (ref 0.3–1.2)
Total Protein: 6.2 g/dL — ABNORMAL LOW (ref 6.5–8.1)

## 2019-02-10 LAB — PHOSPHORUS: Phosphorus: 3.6 mg/dL (ref 2.5–4.6)

## 2019-02-10 LAB — GLUCOSE, CAPILLARY
Glucose-Capillary: 130 mg/dL — ABNORMAL HIGH (ref 70–99)
Glucose-Capillary: 116 mg/dL — ABNORMAL HIGH (ref 70–99)
Glucose-Capillary: 113 mg/dL — ABNORMAL HIGH (ref 70–99)
Glucose-Capillary: 97 mg/dL (ref 70–99)

## 2019-02-10 LAB — HEPATITIS PANEL, ACUTE
HCV Ab: <0.1 s/co ratio (ref 0.0–0.9)
Hep A IgM: NEGATIVE
Hep B C IgM: NEGATIVE
Hepatitis B Surface Ag: NEGATIVE

## 2019-02-10 LAB — MAGNESIUM: Magnesium: 2 mg/dL (ref 1.7–2.4)

## 2019-02-10 MED ORDER — SODIUM CHLORIDE 0.9 % IV SOLN
INTRAVENOUS | Status: DC
  Administered 2019-02-10: 15:00:00 via INTRAVENOUS

## 2019-02-10 MED ORDER — POLYETHYLENE GLYCOL 3350 17 G PO PACK
17.0000 g | PACK | Freq: Once | ORAL | Status: AC
  Administered 2019-02-10: 17 g via ORAL
  Filled 2019-02-10: qty 1

## 2019-02-10 MED ORDER — TRAVASOL 10 % IV SOLN
INTRAVENOUS | Status: DC
  Administered 2019-02-10: 18:00:00 via INTRAVENOUS
  Filled 2019-02-10: qty 1081.2

## 2019-02-10 MED ORDER — BISACODYL 10 MG RE SUPP
10.0000 mg | Freq: Once | RECTAL | Status: AC
  Administered 2019-02-10: 10 mg via RECTAL
  Filled 2019-02-10: qty 1

## 2019-02-10 NOTE — Progress Notes (Signed)
11 Days Post-Op    CC:  Ruptured bladder  Subjective: He is having flatus and no BM.  He says he's had issues with chronic constipation for years.  On Miralax at home, although it is not listed in home medicines.  Drainage from the NG is just clear, looks like ice.    Objective: Vital signs in last 24 hours: Temp:  [98 F (36.7 C)-98.8 F (37.1 C)] 98 F (36.7 C) (04/23 0802) Pulse Rate:  [79-101] 88 (04/23 0802) Resp:  [16-18] 18 (04/23 0802) BP: (94-108)/(64-81) 94/64 (04/23 0802) SpO2:  [93 %-97 %] 94 % (04/23 0802) Last BM Date: 02/07/19 100 PO recorded 1935 IV 1350 urine 1200 NG No BM Afebrile, VSS K+ 4.5; LFT's OK  Intake/Output from previous day: 04/22 0701 - 04/23 0700 In: 2085.8 [P.O.:100; I.V.:1935.8; NG/GT:50] Out: 2550 [Urine:1350; Emesis/NG output:1200] Intake/Output this shift: No intake/output data recorded.  General appearance: alert, cooperative and no distress Resp: clear to auscultation bilaterally GI: soft, still distended BS very hypoactive, + flauts( not a lot), No BM  Staples taken out of abdominal incision and some separation.  Wet to dry dressing by Urology.    Lab Results:  Recent Labs    02/08/19 0509  WBC 8.7  HGB 11.8*  HCT 36.5*  PLT 275    BMET Recent Labs    02/09/19 0746 02/10/19 0500  NA 132* 134*  K 4.4 4.5  CL 99 100  CO2 24 27  GLUCOSE 133* 137*  BUN 16 17  CREATININE 0.89 0.89  CALCIUM 9.2 9.4   PT/INR No results for input(s): LABPROT, INR in the last 72 hours.  Recent Labs  Lab 02/08/19 0509 02/09/19 0746 02/10/19 0500  AST 87* 52* 43*  ALT 103* 81* 76*  ALKPHOS 153* 134* 153*  BILITOT 3.0* 1.6* 1.2  PROT 6.2* 5.9* 6.2*  ALBUMIN 2.5* 2.4* 2.5*     Lipase  No results found for: LIPASE   Medications: . bisacodyl  10 mg Rectal Once  . finasteride  5 mg Oral Daily  . fluticasone furoate-vilanterol  1 puff Inhalation Daily  . heparin injection (subcutaneous)  5,000 Units Subcutaneous Q8H  .  insulin aspart  0-15 Units Subcutaneous Q4H  . metoCLOPramide (REGLAN) injection  10 mg Intravenous Q6H  . metoprolol tartrate  5 mg Intravenous Q6H  . sodium chloride flush  10-40 mL Intracatheter Q12H  . umeclidinium bromide  1 puff Inhalation Daily    Assessment/Plan Clot retention with intraperitoneal bladder rupture and AKI Bladder rupture Exploratory laparotomy with repair of bladder rupture.22Fr foley. NG, JP, Dr. Annabell Howells, 01/31/19  POD#10  Postop ileus - AXR 04/20showed gaseous distention of sm bowel and colon - NGT, IVF until return of bowel function - encourage ambulation  Moderate Malnutrition - Prealbumin 11.4  Elevated LFT's  - RUQ [place] and labs pending for today  FEN: IV fluids/TPN/NPO ID: Rocephin 4/13-4/16/20 DVT: Heparin Follow up: TBD    Plan:  Try a dulcolax and clamp the NG.  Sips and chips.  Continue TNA.  Keep walking him.  He refused dulcolax supp 2 days ago.    LOS: 11 days    Drianna Chandran 02/10/2019 [phone]

## 2019-02-10 NOTE — Plan of Care (Signed)
  Problem: Clinical Measurements: Goal: Ability to maintain clinical measurements within normal limits will improve Outcome: Progressing Goal: Diagnostic test results will improve Outcome: Progressing   Problem: Elimination: Goal: Will not experience complications related to bowel motility Outcome: Progressing BM today after suppository and miralax NG tube remains clamped denies any nausea, bowel sounds x4, Abdomen remains distended but softer since BM. Tolerating sips of clears liquids.  Ambulated 3 times around unit, tolerated well

## 2019-02-10 NOTE — Progress Notes (Signed)
Saw pt to remove lower abdominal wound staples.  He is POD 11.  Removed top 1/2 of staples and wound began to dehisce.  Two separate areas opened with a skin bridge in between.  Wound clean with red base and minimal granulation present. No evidence of fascial dehiscence. Left remaining staples in place.  Wound packed with wet to dry dressing which will need to be changed BID. RN made aware.

## 2019-02-10 NOTE — Progress Notes (Signed)
11 Days Post-Op  Subjective: Caleb Potter is doing well without significant complaints.   His NG output has declined and the tube is currently clamped.  He has no nausea or pain.    ROS:  Review of Systems  All other systems reviewed and are negative.   Anti-infectives: Anti-infectives (From admission, onward)   Start     Dose/Rate Route Frequency Ordered Stop   02/01/19 0100  cefTRIAXone (ROCEPHIN) 2 g in sodium chloride 0.9 % 100 mL IVPB  Status:  Discontinued     2 g 200 mL/hr over 30 Minutes Intravenous Every 24 hours 01/31/19 0223 02/03/19 0855   01/31/19 0030  cefTRIAXone (ROCEPHIN) 2 g in sodium chloride 0.9 % 100 mL IVPB     2 g 200 mL/hr over 30 Minutes Intravenous  Once 01/31/19 0015 01/31/19 0240      Current Facility-Administered Medications  Medication Dose Route Frequency Provider Last Rate Last Dose  . acetaminophen (TYLENOL) tablet 650 mg  650 mg Oral Q6H PRN Opyd, Lavone Neri, MD       Or  . acetaminophen (TYLENOL) suppository 650 mg  650 mg Rectal Q6H PRN Opyd, Lavone Neri, MD      . bisacodyl (DULCOLAX) suppository 10 mg  10 mg Rectal Once Manus Rudd, MD      . diphenhydrAMINE (BENADRYL) injection 12.5-25 mg  12.5-25 mg Intravenous Q6H PRN Bjorn Pippin, MD       Or  . diphenhydrAMINE (BENADRYL) 12.5 MG/5ML elixir 12.5-25 mg  12.5-25 mg Oral Q6H PRN Bjorn Pippin, MD      . finasteride (PROSCAR) tablet 5 mg  5 mg Oral Daily Bjorn Pippin, MD   5 mg at 02/09/19 1002  . fluticasone furoate-vilanterol (BREO ELLIPTA) 100-25 MCG/INH 1 puff  1 puff Inhalation Daily Bjorn Pippin, MD   1 puff at 02/10/19 0752  . heparin injection 5,000 Units  5,000 Units Subcutaneous Q8H Bjorn Pippin, MD   5,000 Units at 02/09/19 1334  . HYDROcodone-acetaminophen (NORCO/VICODIN) 5-325 MG per tablet 1-2 tablet  1-2 tablet Oral Q4H PRN Opyd, Lavone Neri, MD      . HYDROmorphone (DILAUDID) injection 0.5-1 mg  0.5-1 mg Intravenous Q2H PRN Bjorn Pippin, MD   1 mg at 02/02/19 2104  . insulin aspart (novoLOG)  injection 0-15 Units  0-15 Units Subcutaneous Q4H [facility]   2 Units at 02/10/19 1610  . lactated ringers infusion   Intravenous Continuous Maurilio Lovely D, DO 100 mL/hr at 02/09/19 1729    . LORazepam (ATIVAN) injection 1 mg  1 mg Intravenous Q6H PRN Mariea Clonts, Courage, MD   1 mg at 02/09/19 1334  . menthol-cetylpyridinium (CEPACOL) lozenge 3 mg  1 lozenge Oral PRN Focht, Jessica L, PA   3 mg at 02/08/19 1453  . metoCLOPramide (REGLAN) injection 10 mg  10 mg Intravenous Q6H Manus Rudd, MD   10 mg at 02/10/19 9604  . metoprolol tartrate (LOPRESSOR) injection 5 mg  5 mg Intravenous Q6H Emokpae, Courage, MD   5 mg at 02/10/19 5409  . morphine 4 MG/ML injection 4 mg  4 mg Intravenous Q4H PRN Opyd, Lavone Neri, MD      . ondansetron (ZOFRAN) tablet 4 mg  4 mg Oral Q6H PRN Opyd, Lavone Neri, MD       Or  . ondansetron (ZOFRAN) injection 4 mg  4 mg Intravenous Q6H PRN Opyd, Lavone Neri, MD   4 mg at 02/01/19 1237  . oxyCODONE (Oxy IR/ROXICODONE) immediate release tablet 5 mg  5 mg Oral Q4H PRN Bjorn Pippin, MD      . senna-docusate (Senokot-S) tablet 1 tablet  1 tablet Oral QHS PRN Bjorn Pippin, MD      . sodium chloride flush (NS) 0.9 % injection 10-40 mL  10-40 mL Intracatheter Q12H Bjorn Pippin, MD   10 mL at 02/09/19 1004  . sodium chloride flush (NS) 0.9 % injection 10-40 mL  10-40 mL Intracatheter PRN Bjorn Pippin, MD      . sodium phosphate (FLEET) 7-19 GM/118ML enema 1 enema  1 enema Rectal Once PRN Bjorn Pippin, MD      . TPN ADULT (ION)   Intravenous Continuous TPN Quenton Fetter, RPH 85 mL/hr at 02/09/19 1727    . umeclidinium bromide (INCRUSE ELLIPTA) 62.5 MCG/INH 1 puff  1 puff Inhalation Daily Bjorn Pippin, MD   1 puff at 02/10/19 0752  . zolpidem (AMBIEN) tablet 5 mg  5 mg Oral QHS PRN,MR X 1 Bjorn Pippin, MD         Objective: Vital signs in last 24 hours: Temp:  [98 F (36.7 C)-98.8 F (37.1 C)] 98 F (36.7 C) (04/23 0802) Pulse Rate:  [79-101] 88 (04/23 0802) Resp:   [16-18] 18 (04/23 0802) BP: (94-108)/(64-81) 94/64 (04/23 0802) SpO2:  [93 %-97 %] 94 % (04/23 0802)  Intake/Output from previous day: 04/22 0701 - 04/23 0700 In: 2085.8 [P.O.:100; I.V.:1935.8; NG/GT:50] Out: 2550 [Urine:1350; Emesis/NG output:1200] Intake/Output this shift: No intake/output data recorded.   Physical Exam Vitals signs reviewed.  Constitutional:      Appearance: Normal appearance. He is obese.  Cardiovascular:     Rate and Rhythm: Normal rate and regular rhythm.  Pulmonary:     Effort: Pulmonary effort is normal. No respiratory distress.     Breath sounds: Normal breath sounds.  Abdominal:     Comments: Soft, obese with reduced distention and improved BS.    Genitourinary:    Comments: Urine clear in foley.  Musculoskeletal: Normal range of motion.        General: No swelling or tenderness.  Neurological:     General: No focal deficit present.     Mental Status: He is alert and oriented to person, place, and time.     Lab Results:  Recent Labs    02/08/19 0509  WBC 8.7  HGB 11.8*  HCT 36.5*  PLT 275   BMET Recent Labs    02/09/19 0746 02/10/19 0500  NA 132* 134*  K 4.4 4.5  CL 99 100  CO2 24 27  GLUCOSE 133* 137*  BUN 16 17  CREATININE 0.89 0.89  CALCIUM 9.2 9.4   PT/INR No results for input(s): LABPROT, INR in the last 72 hours. ABG No results for input(s): PHART, HCO3 in the last 72 hours.  Invalid input(s): PCO2, PO2  Studies/Results: Dg Abd Acute 2+v W 1v Chest  Result Date: 02/08/2019 CLINICAL DATA:  Followup postoperative ileus. Persistent abdominal pain and abdominal distension. EXAM: DG ABDOMEN ACUTE W/ 1V CHEST COMPARISON:  02/05/2019 and earlier, including CT abdomen and pelvis 02/04/2019 and earlier. Chest x-Caleb 01/30/2019 and earlier. FINDINGS: Marked gaseous distension of the small bowel diffusely and moderate gaseous distention of the ascending and transverse colon has not significantly changed over the past 6 days. The  descending colon, sigmoid colon and rectum are decompressed currently. Contrast material from the CT is present throughout the colon. No evidence of free intraperitoneal air on the [employer]. Nasogastric to courses through the stomach with its  tip in the proximal duodenum as noted on the recent CT. Cardiac silhouette upper normal in size, unchanged. Lungs remain clear. RIGHT arm PICC tip projects over the LOWER SVC. IMPRESSION: 1. Stable severe generalized ileus (favored over small bowel obstruction). No free intraperitoneal air. 2. No acute cardiopulmonary disease. Electronically Signed   By: Hulan Saashomas  Lawrence M.D.   On: 02/08/2019 09:46   [place] Abdomen Limited Ruq  Result Date: 02/09/2019 CLINICAL DATA:  General abdominal pain. EXAM: ULTRASOUND ABDOMEN LIMITED RIGHT UPPER QUADRANT COMPARISON:  CT 02/04/2019 FINDINGS: Gallbladder: Echogenic foci in the gallbladder suggestive for small stones and sludge. No gallbladder wall thickening. Patient does not have a sonographic Murphy sign. Common bile duct: Diameter: 0.4 cm Liver: Liver is mildly heterogeneous. No focal lesion. Overall, increased echogenicity in the liver compared to the adjacent right kidney. Portal vein is patent on color Doppler imaging with normal direction of blood flow towards the liver. IMPRESSION: 1. Echogenic material in the gallbladder is suggestive for small stones and sludge. No sonographic evidence for acute cholecystitis. No biliary dilatation. 2. Liver is mildly echogenic and heterogeneous. Findings could be associated with steatosis. Electronically Signed   By: Richarda OverlieAdam  Henn M.D.   On: 02/09/2019 08:22     Assessment and Plan: POD #11 from X-lap with repair of bladder rupture related to clot retention and a fall.  He has underlying BPH with BOO that will need to be addressed after discharge.    Prolonged post op ileus seems to be improving.   NG clamped with no nausea.  + Flatus without BM.   Appreciated General Surgery input and  will await their recommendations for advancing the diet.     Elevated LFT's are stable.        LOS: 11 days    Bjorn PippinJohn Devon Potter 02/10/2019 [phone] ID: Caleb ChurchJoseph T Potter, male   DOB: 08-28-1961, 58 y.o.   MRN: [id number]

## 2019-02-10 NOTE — Progress Notes (Signed)
PROGRESS NOTE  Caleb Potter YQM:[id number] DOB: [birth year] DOA: 01/30/2019 PCP: [name], MD  Brief History:  58 y.o.malewith a history of HTN, CAD, and nephrolithiasis who presented to the [facility] ED with abdominal pain found to have intraperitoneal fluid, suspected hemorrhage on CT abd/pelvis with subsequent CT pyelogram demonstrating ruptured bladder and hyperkalemic AKI with creatinine 7.7, K 6.1. Urology performed ex lap with bladder repair 4/13. IV fluids, lokelma, bicarbonate, and calcium gluconate were provided as well. Postoperatively renal function and electrolytes have normalized, though he has an ileus vs. partial SBO with NG tube placed. Abdominal XR and CT abdomen and pelvis with contrast shows persistent dilated loops of small bowel.Pt started onIV TPN pending return of bowel function  Assessment/Plan: Post-op Ileus -appreciate general surgery follow up -NG clamped today -remains NPO -repeatabdx-rays on 4/21/20with generalized ileus -continue TPN -no BM today but passing flatus -diet advancement per surgery -miralax and bisacodyl -4/23--case discussed with general surgery  Hypercalcemia -pt had "normal" calcium on day of admission -likely multifactorial including TPN, LR, immobility -d/c LR -start NS -intact PTH  AKI -due to BOO -resolved -hold lisinopril  Transaminasemia -hepatic steatosis and TPN -continue to trend--appears to be trending down -4/22 RUQ US--cholelithiasis without biliary ductal dilatation; hepatic steatosis  Bladder Rupture -Exploratory laparotomy with repair of bladder rupture.22Fr foley. NG, JP, Dr. Annabell Howells, 01/31/19 POD#11  Essential Hypertension -continue IV metoprolol until able to take po -holding lisinopril -controlled  COPD -stable -continue incruse and Breo  Alcohol abuse -last Etoh 4 days PTA -no signs of withdrawal  Coronary artery disease -no chest pain -resume ASA and statin one able  to tolerate po  Hyperglycemia -likely due to TPN -check A1C      Disposition Plan:   Home when bowel function returns Family Communication:   No Family at bedside  Consultants:general surgery; urology is primary    Code Status:  FULL   DVT Prophylaxis:  [place] Heparin   Procedures: As Listed in Progress Note Above  Antibiotics: None    Subjective: Pt passing flatus, but no BM.  Denies f/c, cp, sob, n/v. He has intermittent abd pain.   Objective: Vitals:   02/10/19 0331 02/10/19 0753 02/10/19 0802 02/10/19 1126  BP: 104/72  94/64 102/70  Pulse: 93  88 95  Resp: 16  18   Temp: 98.4 F (36.9 C)  98 F (36.7 C) 98.8 F (37.1 C)  TempSrc: Oral  Oral Oral  SpO2: 93% 94% 94% 92%  Weight:      Height:        Intake/Output Summary (Last 24 hours) at 02/10/2019 1543 Last data filed at 02/10/2019 1513 Gross per 24 hour  Intake 5267.97 ml  Output 3400 ml  Net 1867.97 ml   Weight change:  Exam:   General:  Pt is alert, follows commands appropriately, not in acute distress  HEENT: No icterus, No thrush, No neck mass, [place]/AT  Cardiovascular: RRR, S1/S2, no rubs, no gallops  Respiratory: CTA bilaterally, no wheezing, no crackles, no rhonchi  Abdomen: Soft/+BS, non tender, mildly distended, no guarding  Extremities: No edema, No lymphangitis, No petechiae, No rashes, no synovitis   Data Reviewed: I have personally reviewed following labs and imaging studies Basic Metabolic Panel: Recent Labs  Lab 02/05/19 1206 02/06/19 0440 02/08/19 0509 02/09/19 0746 02/10/19 0500  NA 135 135 134* 132* 134*  K 4.7 4.9 4.3 4.4 4.5  CL 99 97* 98 99 100  CO2 26 28 26 24 27   GLUCOSE 136* 129* 122* 133* 137*  BUN 14 13 19 16 17   CREATININE 0.96 1.11 1.09 0.89 0.89  CALCIUM 9.1 9.2 9.1 9.2 9.4  MG  --   --  1.9 1.9 2.0  PHOS  --   --  4.1 3.5 3.6   Liver Function Tests: Recent Labs  Lab 02/08/19 0509 02/09/19 0746 02/10/19 0500  AST 87* 52* 43*  ALT  103* 81* 76*  ALKPHOS 153* 134* 153*  BILITOT 3.0* 1.6* 1.2  PROT 6.2* 5.9* 6.2*  ALBUMIN 2.5* 2.4* 2.5*   No results for input(s): LIPASE, AMYLASE in the last 168 hours. No results for input(s): AMMONIA in the last 168 hours. Coagulation Profile: No results for input(s): INR, PROTIME in the last 168 hours. CBC: Recent Labs  Lab 02/05/19 1206 02/06/19 0440 02/08/19 0509  WBC 8.1 9.6 8.7  NEUTROABS  --   --  5.2  HGB 12.2* 12.5* 11.8*  HCT 37.2* 38.9* 36.5*  MCV 97.4 98.7 97.6  PLT 222 262 275   Cardiac Enzymes: No results for input(s): CKTOTAL, CKMB, CKMBINDEX, TROPONINI in the last 168 hours. BNP: Invalid input(s): POCBNP CBG: Recent Labs  Lab 02/09/19 1543 02/09/19 1712 02/09/19 2016 02/10/19 0801 02/10/19 1122  GLUCAP 133* 132* 108* 130* 116*   HbA1C: No results for input(s): HGBA1C in the last 72 hours. Urine analysis:    Component Value Date/Time   COLORURINE YELLOW 01/31/2019 0312   APPEARANCEUR CLEAR 01/31/2019 0312   LABSPEC 1.015 01/31/2019 0312   PHURINE 5.0 01/31/2019 0312   GLUCOSEU 50 (A) 01/31/2019 0312   HGBUR LARGE (A) 01/31/2019 0312   BILIRUBINUR NEGATIVE 01/31/2019 0312   KETONESUR NEGATIVE 01/31/2019 0312   PROTEINUR NEGATIVE 01/31/2019 0312   NITRITE NEGATIVE 01/31/2019 0312   LEUKOCYTESUR NEGATIVE 01/31/2019 0312   Sepsis Labs: @LABRCNTIP (procalcitonin:4,lacticidven:4) )No results found for this or any previous visit (from the past 240 hour(s)).   Scheduled Meds:  finasteride  5 mg Oral Daily   fluticasone furoate-vilanterol  1 puff Inhalation Daily   heparin injection (subcutaneous)  5,000 Units Subcutaneous Q8H   insulin aspart  0-15 Units Subcutaneous Q4H   metoCLOPramide (REGLAN) injection  10 mg Intravenous Q6H   metoprolol tartrate  5 mg Intravenous Q6H   sodium chloride flush  10-40 mL Intracatheter Q12H   umeclidinium bromide  1 puff Inhalation Daily   Continuous Infusions:  sodium chloride 100 mL/hr at  02/10/19 1513   TPN ADULT (ION) 85 mL/hr at 02/09/19 1727   TPN ADULT (ION)      Procedures/Studies: Ct Abdomen Pelvis Wo Contrast  Result Date: 02/04/2019 CLINICAL DATA:  Bowel obstruction versus ileus. Bladder rupture on Sunday. Laparotomy 01/30/2019 with bladder repair. EXAM: CT ABDOMEN AND PELVIS WITHOUT CONTRAST TECHNIQUE: Multidetector CT imaging of the abdomen and pelvis was performed following the standard protocol without IV contrast. COMPARISON:  Plain films 02/04/2019.  Most recent CT 01/30/2019 FINDINGS: Lower chest: Right base subsegmental atelectasis. Normal heart size without pericardial or pleural effusion. Multivessel coronary artery atherosclerosis. Hepatobiliary: Mild hepatic steatosis. Normal gallbladder, without biliary ductal dilatation. Pancreas: Normal, without mass or ductal dilatation. Spleen: Normal in size, without focal abnormality. Adrenals/Urinary Tract: Normal adrenal glands. 3 mm interpolar right renal collecting system calculus. No hydronephrosis. Foley catheter within the urinary bladder. Stomach/Bowel: Nasogastric tube terminates at the gastric body. The colon is normal in caliber. The terminal ileum is normal, including on image 60/3. Proximal and mid small bowel loops are dilated,  including at up to 5.6 cm on image 50/3. The mid to distal ileum undergoes a gradual transition to normal caliber, without focal abnormality. No findings to suggest ischemia. Vascular/Lymphatic: Aortic and branch vessel atherosclerosis. No abdominopelvic adenopathy. Reproductive: Mild prostatomegaly. Other: A surgical drain from a left pelvic approach terminates in the cul-de-sac. No residual abdominopelvic fluid. No free intraperitoneal air. Musculoskeletal: Moderate left-sided gynecomastia. Lumbosacral spondylosis. IMPRESSION: 1. Relatively diffuse small bowel dilatation, without focal transition point. Findings are most consistent with postoperative adynamic ileus. 2. Right  nephrolithiasis. 3.  Aortic Atherosclerosis (ICD10-I70.0). 4. Hepatic steatosis. 5. Left gynecomastia. Electronically Signed   By: Jeronimo Greaves M.D.   On: 02/04/2019 14:41   Ct Abdomen Pelvis Wo Contrast  Result Date: 01/30/2019 CLINICAL DATA:  Gross hematuria.  Bilateral flank pain after fall. EXAM: CT ABDOMEN AND PELVIS WITHOUT CONTRAST TECHNIQUE: Multidetector CT imaging of the abdomen and pelvis was performed following the standard protocol without IV contrast. COMPARISON:  CT scan of January 26, 2019. FINDINGS: Lower chest: No acute abnormality. Hepatobiliary: No gallstones or biliary dilatation is noted. Mild amount of fluid is noted around the liver with dense material seen posteriorly consistent with hematoma. Pancreas: Unremarkable. No pancreatic ductal dilatation or surrounding inflammatory changes. Spleen: Fluid is noted around the spleen which was not present on prior exam and is concerning for hemorrhage. Adrenals/Urinary Tract: Adrenal glands appear normal. Nonobstructive right renal calculus is noted. No hydronephrosis or renal obstruction is noted. The amount of hemorrhage seen in urinary bladder on prior exam is significantly decreased, although residual hemorrhage does remain in the dependent portion of the bladder. Stomach/Bowel: The stomach appears normal. There is no evidence of bowel obstruction or inflammation. The appendix is not clearly visualized. Vascular/Lymphatic: Aortic atherosclerosis. No enlarged abdominal or pelvic lymph nodes. Reproductive: Stable mild prostatic enlargement is noted. Other: There does appear to be a moderate amount of hemorrhage present in the dependent portion of the pelvis as well as in the left pericolic gutter. Musculoskeletal: No acute or significant osseous findings. IMPRESSION: There is interval development of a moderate amount of intraperitoneal hemorrhage seen in the pelvis, both pericolic gutters, and around the liver and spleen. Critical Value/emergent  results were called by telephone at the time of interpretation on 01/30/2019 at 8:29 pm to Dr. Terance Hart , who verbally acknowledged these results. The large amount of hemorrhage seen within the urinary bladder on prior exam has significantly improved, with only a mild amount of residual hemorrhage or thrombus seen posteriorly in the urinary bladder on current exam. Nonobstructive right renal calculus. No hydronephrosis or renal obstruction is noted. Stable mild prostatic enlargement. Electronically Signed   By: Lupita Raider, M.D.   On: 01/30/2019 20:30   Dg Abd 1 View  Result Date: 02/01/2019 CLINICAL DATA:  Enteric tube placement EXAM: ABDOMEN - 1 VIEW COMPARISON:  None. FINDINGS: The tip of the enteric tube is at the expected location of the gastroduodenal junction or first portion of the duodenum. The side port is still likely within the stomach. There are multiple dilated loops of bowel throughout the abdomen. IMPRESSION: Enteric tube tip near the gastroduodenal junction. Electronically Signed   By: Deatra Robinson M.D.   On: 02/01/2019 14:09   Ct Pelvis Wo Contrast  Result Date: 01/30/2019 CLINICAL DATA:  Considerable free fluid within the abdomen on recent CT, request is been made for CT cystography. EXAM: CT PELVIS WITHOUT CONTRAST TECHNIQUE: Multidetector CT imaging of the pelvis was performed following the standard protocol without  intravenous contrast. COMPARISON:  CT from earlier in the same day. FINDINGS: Urinary Tract: Contrast material is been instilled through the Foley catheter. The Foley catheter balloon is well visualized. The bladder distends well and demonstrates diffuse intraluminal filling defect consistent with residual thrombus. Additionally there is extravasation of contrast material from the dome of the bladder in a linear fashion which extends for approximately 2.9 cm consistent with bladder rupture. Contrast enhancement of the intraperitoneal fluid seen on the recent CT  examination consistent with the extravasation. Bowel:  Bowel is again within normal limits. Vascular/Lymphatic: Atherosclerotic calcifications are seen. No lymphadenopathy is noted. Reproductive: Prostate is mildly prominent but stable from the recent exam. Other: Free fluid is again noted within the abdomen and pelvis related to the bladder perforation. Musculoskeletal: Stable in appearance when compare with the prior CT. IMPRESSION: CT cystography reveals a long segment of bladder discontinuity along the dome consistent with rupture. Passage of contrast material into the peritoneal cavity is seen. Retained thrombus within the bladder is noted. Electronically Signed   By: Alcide Clever M.D.   On: 01/30/2019 23:20   Dg Chest Portable 1 View  Result Date: 01/30/2019 CLINICAL DATA:  Chest pain EXAM: PORTABLE CHEST 1 VIEW COMPARISON:  11/03/2014 FINDINGS: Cardiac shadows within normal limits. Tortuosity of the thoracic aorta is identified somewhat accentuated by the portable technique and patient rotation. The lungs are clear. No acute bony abnormality is noted. IMPRESSION: No acute abnormality noted. Electronically Signed   By: Alcide Clever M.D.   On: 01/30/2019 19:55   Dg Abd Acute 2+v W 1v Chest  Result Date: 02/08/2019 CLINICAL DATA:  Followup postoperative ileus. Persistent abdominal pain and abdominal distension. EXAM: DG ABDOMEN ACUTE W/ 1V CHEST COMPARISON:  02/05/2019 and earlier, including CT abdomen and pelvis 02/04/2019 and earlier. Chest x-ray 01/30/2019 and earlier. FINDINGS: Marked gaseous distension of the small bowel diffusely and moderate gaseous distention of the ascending and transverse colon has not significantly changed over the past 6 days. The descending colon, sigmoid colon and rectum are decompressed currently. Contrast material from the CT is present throughout the colon. No evidence of free intraperitoneal air on the [employer]. Nasogastric to courses through the stomach with its tip  in the proximal duodenum as noted on the recent CT. Cardiac silhouette upper normal in size, unchanged. Lungs remain clear. RIGHT arm PICC tip projects over the LOWER SVC. IMPRESSION: 1. Stable severe generalized ileus (favored over small bowel obstruction). No free intraperitoneal air. 2. No acute cardiopulmonary disease. Electronically Signed   By: Hulan Saas M.D.   On: 02/08/2019 09:46   Dg Abd Portable 1v  Result Date: 02/07/2019 CLINICAL DATA:  58 year old male with abdominal distension EXAM: PORTABLE ABDOMEN - 1 VIEW COMPARISON:  Prior abdominal radiographs 02/05/2019 FINDINGS: Persistent gaseous distension of small bowel throughout the abdomen. The maximal diameter is 5.7 cm. A gastric tube remains present. The tip overlies the gastric antrum. Previously ingested contrast material opacifies the colon. The colon is not distended. A surgical drain projects over the anatomic pelvis. A staple line is present. The lung bases are clear. IMPRESSION: 1. Persistent gaseous distension of small bowel in the mid abdomen. Contrast material is now present throughout the colon. Findings suggest partial small bowel obstruction versus postoperative small bowel ileus. 2. Well-positioned gastrostomy tube with the tip in the region of the gastric antrum. Electronically Signed   By: Malachy Moan M.D.   On: 02/07/2019 08:13   Dg Abd Portable 1v  Result Date:  02/05/2019 CLINICAL DATA:  Postoperative ileus. Hx of recent repair of ruptured bladder. Pt denies abdominal pain and constipation, stating he has only had ice and water to eat and drink and doesn't expect to be able to have a bowel movement without food. EXAM: PORTABLE ABDOMEN - 1 VIEW COMPARISON:  None. FINDINGS: There is a nasogastric tube with the tip projecting over the antrum of the stomach/pylorus. There is gaseous distension of small bowel and colon most consistent with a postoperative ileus. There is no evidence of pneumoperitoneum, portal venous  gas or pneumatosis. There are no pathologic calcifications along the expected course of the ureters. The osseous structures are unremarkable. IMPRESSION: 1. Nasogastric tube with the tip projecting over the antrum of the stomach/pylorus. 2. Persistent gaseous distension of the small bowel and colon consistent with postoperative ileus. Electronically Signed   By: Elige Ko   On: 02/05/2019 13:35   Dg Abd Portable 1v  Result Date: 02/04/2019 CLINICAL DATA:  Ileus. EXAM: PORTABLE ABDOMEN - 1 VIEW COMPARISON:  One-view abdomen 02/01/2019 FINDINGS: Multiple dilated loops of small bowel are again noted. NG tube is in place. The stomach is decompressed. Overall bowel dilation is improved. IMPRESSION: 1. Improving dilation of small bowel. Electronically Signed   By: Marin Roberts M.D.   On: 02/04/2019 08:35   Dg Abd Portable 1v  Result Date: 02/01/2019 CLINICAL DATA:  Abdominal distension. EXAM: PORTABLE ABDOMEN - 1 VIEW COMPARISON:  CT scan January 30, 2019 FINDINGS: Lung bases are normal. No free air, portal venous gas, or pneumatosis on the supine view. Dilated loops of small bowel are identified. There is also air seen within the ascending and transverse colon which are mildly prominent. The dilated small bowel loops measure up to 5.2 cm. The transverse colon measures 6.6 cm. No other acute abnormalities. IMPRESSION: 1. Dilated loops of small bowel and mildly prominent loops of colon. The small bowel appears to be dilated out of proportion to the colon suggesting early or partial small bowel obstruction. Given the amount of air in the colon, a developing ileus is also a possibility. Recommend clinical correlation. A CT scan could further evaluate if clinically warranted. Otherwise, recommend attention on close follow-up. These results will be called to the ordering clinician or representative by the Radiologist Assistant, and communication documented in the PACS or zVision Dashboard. Electronically  Signed   By: Gerome Sam III M.D   On: 02/01/2019 08:18   [place] Ekg Site Rite  Result Date: 02/07/2019 If Site Rite image not attached, placement could not be confirmed due to current cardiac rhythm.  US Abdomen Limited Ruq  Result Date: 02/09/2019 CLINICAL DATA:  General abdominal pain. EXAM: ULTRASOUND ABDOMEN LIMITED RIGHT UPPER QUADRANT COMPARISON:  CT 02/04/2019 FINDINGS: Gallbladder: Echogenic foci in the gallbladder suggestive for small stones and sludge. No gallbladder wall thickening. Patient does not have a sonographic Murphy sign. Common bile duct: Diameter: 0.4 cm Liver: Liver is mildly heterogeneous. No focal lesion. Overall, increased echogenicity in the liver compared to the adjacent right kidney. Portal vein is patent on color Doppler imaging with normal direction of blood flow towards the liver. IMPRESSION: 1. Echogenic material in the gallbladder is suggestive for small stones and sludge. No sonographic evidence for acute cholecystitis. No biliary dilatation. 2. Liver is mildly echogenic and heterogeneous. Findings could be associated with steatosis. Electronically Signed   By: Richarda Overlie M.D.   On: 02/09/2019 08:22    Catarina Hartshorn, DO  [facility] Pager [phone]  If  7PM-7AM, please contact night-coverage [email or web address] Password [facility] 02/10/2019, 3:43 PM   LOS: 11 days

## 2019-02-10 NOTE — Progress Notes (Signed)
Physical Therapy Treatment Patient Details Name: Caleb ChurchJoseph T Potter MRN: [id number] DOB: June 13, 1961 Today's Date: 02/10/2019    History of Present Illness Patient is a 58 yo male who came in for fall with significant abdominal pain found to have free fluid in abdomen and is now s/p X-lap with repair of the bladder rupture and drainage of the intraperitoneal urine. PMH including but not limited to CAD, HTN, MI.    PT Comments    Pt continuing to make steady progress with mobility. Continue with current POC and mobilization while admitted to facilitate safe d/c home.   Follow Up Recommendations  No PT follow up     Equipment Recommendations  None recommended by PT    Recommendations for Other Services       Precautions / Restrictions Precautions Precautions: Fall Precaution Comments: NGT Restrictions Weight Bearing Restrictions: No    Mobility  Bed Mobility Overal bed mobility: Needs Assistance Bed Mobility: Supine to Sit     Supine to sit: Min guard     General bed mobility comments: min guard for safety with use of bed rail to achieve upright sitting  Transfers Overall transfer level: Needs assistance Equipment used: None Transfers: Sit to/from Stand Sit to Stand: Supervision         General transfer comment: for safety  Ambulation/Gait Ambulation/Gait assistance: Supervision;Min guard Gait Distance (Feet): 400 Feet Assistive device: None Gait Pattern/deviations: Step-through pattern;Decreased stride length;Drifts right/left Gait velocity: decreased   General Gait Details: pt with mild instability but no overt LOB or need for physical assistance, min guard to supervision level   Stairs             Wheelchair Mobility    Modified Rankin (Stroke Patients Only)       Balance Overall balance assessment: History of Falls;Needs assistance Sitting-balance support: Feet supported Sitting balance-Leahy Scale: Good     Standing balance support: No  upper extremity supported Standing balance-Leahy Scale: Good Standing balance comment: pt able to stand at sink to wash hands and brush his teeth without UE supports or physical assistance, supervision for safety                            Cognition Arousal/Alertness: Awake/alert Behavior During Therapy: WFL for tasks assessed/performed Overall Cognitive Status: Within Functional Limits for tasks assessed                                        Exercises      General Comments        Pertinent Vitals/Pain Pain Assessment: No/denies pain    Home Living                      Prior Function            PT Goals (current goals can now be found in the care plan section) Acute Rehab PT Goals PT Goal Formulation: With patient Time For Goal Achievement: 02/21/19 Potential to Achieve Goals: Good Progress towards PT goals: Progressing toward goals    Frequency    Min 5X/week      PT Plan Current plan remains appropriate    Co-evaluation              AM-PAC PT "6 Clicks" Mobility   Outcome Measure  Help needed turning from your back to your  side while in a flat bed without using bedrails?: None Help needed moving from lying on your back to sitting on the side of a flat bed without using bedrails?: None Help needed moving to and from a bed to a chair (including a wheelchair)?: None Help needed standing up from a chair using your arms (e.g., wheelchair or bedside chair)?: A Little Help needed to walk in hospital room?: A Little Help needed climbing 3-5 steps with a railing? : A Little 6 Click Score: 21    End of Session   Activity Tolerance: Patient tolerated treatment well Patient left: in chair;with call bell/phone within reach Nurse Communication: Mobility status PT Visit Diagnosis: History of falling (Z91.81)     Time: 7858-8502 PT Time Calculation (min) (ACUTE ONLY): 26 min  Charges:  $Gait Training: 8-22  mins $Therapeutic Activity: 8-22 mins                     Deborah Chalk, PT, DPT  Acute Rehabilitation Services Pager [phone] Office [phone]     Caleb Potter

## 2019-02-10 NOTE — Progress Notes (Signed)
PHARMACY - ADULT TOTAL PARENTERAL NUTRITION CONSULT NOTE   Pharmacy Consult for TPN Indication: Prolonged Ileus  Patient Measurements: Height: '5\' 6"'$  (167.6 cm) Weight: 218 lb 0.6 oz (98.9 kg) IBW/kg (Calculated) : 63.8 TPN AdjBW (KG): 72.9 Body mass index is 35.19 kg/m.  Assessment:  58 y.o.malewith a history of HTN, CAD, and nephrolithiasis who presented to the [facility] ED with abdominal pain found to have intraperitoneal fluid, suspected hemorrhage on CT abd/pelvis with subsequent CT pyelogram demonstrating ruptured bladder and hyperkalemic AKI with creatinine 7.7, K 6.1. Urology performed ex lap with bladder repair 4/13. Abdominal XR and CT abdomen and pelvis with contrast shows persistent dilated loops of small bowel. Prolonged ileus.  GI: Ileus stable on xray 4/21. NGT back to suction - 1200 mL output last 24hrs. Last BM 4/20. +Flatus. No N/V. Pre-albumin 11.4. Reglan '10mg'$  IV q6h added 4/21. Endo: CBGs <180 Insulin requirements in the past 24 hours: 8 units Lytes: Na improving at 134; CorrCa elevated at 10.6 (Alb 2.5); all others within normal limits Renal: Scr 0.89, BUN 17; UOP 0.6 ml/kg/hr Pulm: RA Cards: BP soft, HR improved, NSR Hepatobil: Hx hepatic steatosis and alcohol abuse. AST/ALT decreasing (43/76), Alk Phos 153; Tbili down 1.2, TG 128 -[place]- no acute cholecystitis Neuro: Alert and oriented x4. Abdominal pain with distension. ID: WBC within normal limits, afebrile, no antibiotics presently  TPN Access: PICC placement ordered 4/20 TPN start date: 4/20 Nutritional Goals (per RD recommendation on 4/20): Kcal:  1900-2100 Protein:  100-115 grams Fluid:  1.9 - 2.1 L/day  Goal TPN rate is 85 ml/hr (This TPN provides 108 g of protein, 277 g of dextrose, and 70 g of lipids which provides 1999 kCals per day, meeting 100% of patient needs)  Current Nutrition:  Sips and chips LR at 100 ml/hr  Plan:  Continue TPN at 85 mL/hr. This TPN provides 108 g of protein, 277 g of  dextrose, and 70 g of lipids which provides 1999 kCals per day, meeting 100% of patient needs Electrolytes in TPN: remove Ca, continue increased Na and slightly increased Mag; others standard Add MVI to TPN - trace elements are on back order will only place in TPN on Monday, Wednesday and Friday Add Thiamine and Folate to TPN Continue moderate SSI and adjust as needed Continue  LR at 100 ml/hr per MD Monitor TPN labs - will monitor liver function closely Monitor output and need to adjust IVF/Fluid goal  Sloan Leiter, PharmD, BCPS, BCCCP Clinical Pharmacist Please see AMION for all Pharmacists' Contact Phone Numbers 02/10/2019, 7:58 AM

## 2019-02-11 LAB — COMPREHENSIVE METABOLIC PANEL
ALT: 66 U/L — ABNORMAL HIGH (ref 0–44)
AST: 39 U/L (ref 15–41)
Albumin: 2.5 g/dL — ABNORMAL LOW (ref 3.5–5.0)
Alkaline Phosphatase: 168 U/L — ABNORMAL HIGH (ref 38–126)
Anion gap: 9 (ref 5–15)
BUN: 16 mg/dL (ref 6–20)
CO2: 24 mmol/L (ref 22–32)
Calcium: 9.3 mg/dL (ref 8.9–10.3)
Chloride: 100 mmol/L (ref 98–111)
Creatinine, Ser: 1 mg/dL (ref 0.61–1.24)
GFR calc Af Amer: >60 mL/min (ref >60)
GFR calc non Af Amer: >60 mL/min (ref >60)
Glucose, Bld: 131 mg/dL — ABNORMAL HIGH (ref 70–99)
Potassium: 4.6 mmol/L (ref 3.5–5.1)
Sodium: 133 mmol/L — ABNORMAL LOW (ref 135–145)
Total Bilirubin: 1.1 mg/dL (ref 0.3–1.2)
Total Protein: 6 g/dL — ABNORMAL LOW (ref 6.5–8.1)

## 2019-02-11 LAB — CBC
HCT: 35.3 % — ABNORMAL LOW (ref 39.0–52.0)
Hemoglobin: 11.5 g/dL — ABNORMAL LOW (ref 13.0–17.0)
MCH: 31.8 pg (ref 26.0–34.0)
MCHC: 32.6 g/dL (ref 30.0–36.0)
MCV: 97.5 fL (ref 80.0–100.0)
Platelets: 337 10*3/uL (ref 150–400)
RBC: 3.62 MIL/uL — ABNORMAL LOW (ref 4.22–5.81)
RDW: 13 % (ref 11.5–15.5)
WBC: 9.4 10*3/uL (ref 4.0–10.5)
nRBC: 0 % (ref 0.0–0.2)

## 2019-02-11 LAB — GLUCOSE, CAPILLARY
Glucose-Capillary: 108 mg/dL — ABNORMAL HIGH (ref 70–99)
Glucose-Capillary: 121 mg/dL — ABNORMAL HIGH (ref 70–99)
Glucose-Capillary: 124 mg/dL — ABNORMAL HIGH (ref 70–99)
Glucose-Capillary: 146 mg/dL — ABNORMAL HIGH (ref 70–99)

## 2019-02-11 LAB — HEMOGLOBIN A1C
Hgb A1c MFr Bld: 5.7 % — ABNORMAL HIGH (ref 4.8–5.6)
Mean Plasma Glucose: 116.89 mg/dL

## 2019-02-11 LAB — PARATHYROID HORMONE, INTACT (NO CA): PTH: 12 pg/mL — ABNORMAL LOW (ref 15–65)

## 2019-02-11 MED ORDER — FINASTERIDE 5 MG PO TABS: 5.0000 mg | ORAL_TABLET | Freq: Every day | ORAL | 11 refills | Status: DC

## 2019-02-11 MED ORDER — METOPROLOL SUCCINATE ER 25 MG PO TB24
50.0000 mg | ORAL_TABLET | Freq: Every day | ORAL | Status: DC
  Administered 2019-02-11 – 2019-02-13 (×2): 50 mg via ORAL
  Filled 2019-02-11 (×3): qty 2

## 2019-02-11 MED ORDER — TRAVASOL 10 % IV SOLN
INTRAVENOUS | Status: DC
  Filled 2019-02-11: qty 1081.2

## 2019-02-11 MED ORDER — POLYETHYLENE GLYCOL 3350 17 G PO PACK: 17.0000 g | PACK | Freq: Every day | ORAL | 11 refills | Status: AC

## 2019-02-11 MED ORDER — ASPIRIN 81 MG PO CHEW
81.0000 mg | CHEWABLE_TABLET | Freq: Every day | ORAL | Status: DC
  Administered 2019-02-12 – 2019-02-13 (×2): 81 mg via ORAL
  Filled 2019-02-11 (×2): qty 1

## 2019-02-11 MED ORDER — HYDROCODONE-ACETAMINOPHEN 5-325 MG PO TABS: 1.0000 | ORAL_TABLET | Freq: Four times a day (QID) | ORAL | 0 refills | Status: DC | PRN

## 2019-02-11 MED ORDER — SIMVASTATIN 20 MG PO TABS
40.0000 mg | ORAL_TABLET | Freq: Every day | ORAL | Status: DC
  Administered 2019-02-12: 40 mg via ORAL
  Filled 2019-02-11: qty 2

## 2019-02-11 MED ORDER — FOLIC ACID 1 MG PO TABS
1.0000 mg | ORAL_TABLET | Freq: Every day | ORAL | Status: DC
  Administered 2019-02-12 – 2019-02-13 (×2): 1 mg via ORAL
  Filled 2019-02-11 (×2): qty 1

## 2019-02-11 MED ORDER — VITAMIN B-1 100 MG PO TABS
100.0000 mg | ORAL_TABLET | Freq: Every day | ORAL | Status: DC
  Administered 2019-02-12 – 2019-02-13 (×2): 100 mg via ORAL
  Filled 2019-02-11 (×2): qty 1

## 2019-02-11 MED ORDER — BOOST / RESOURCE BREEZE PO LIQD CUSTOM
1.0000 | Freq: Three times a day (TID) | ORAL | Status: DC
  Administered 2019-02-11 – 2019-02-13 (×6): 1 via ORAL

## 2019-02-11 MED ORDER — ADULT MULTIVITAMIN W/MINERALS CH
1.0000 | ORAL_TABLET | Freq: Every day | ORAL | Status: DC
  Administered 2019-02-12 – 2019-02-13 (×2): 1 via ORAL
  Filled 2019-02-11 (×2): qty 1

## 2019-02-11 MED ORDER — TAMSULOSIN HCL 0.4 MG PO CAPS: 0.8000 mg | ORAL_CAPSULE | Freq: Every day | ORAL | 11 refills | Status: DC

## 2019-02-11 NOTE — Progress Notes (Addendum)
PHARMACY - ADULT TOTAL PARENTERAL NUTRITION CONSULT NOTE   Pharmacy Consult for TPN Indication: Prolonged Ileus  Patient Measurements: Height: _0  (167.6 cm) Weight: 218 lb 0.6 oz (98.9 kg) IBW/kg (Calculated) : 63.8 TPN AdjBW (KG): 72.9 Body mass index is 35.19 kg/m.  Assessment:  58 y.o.malewith a history of HTN, CAD, and nephrolithiasis who presented to the [facility] ED with abdominal pain found to have intraperitoneal fluid, suspected hemorrhage on CT abd/pelvis with subsequent CT pyelogram demonstrating ruptured bladder and hyperkalemic AKI with creatinine 7.7, K 6.1. Urology performed ex lap with bladder repair 4/13. Abdominal XR and CT abdomen and pelvis with contrast shows persistent dilated loops of small bowel. Prolonged ileus.  GI: Ileus stable on xray 4/21. NGT clamped 4/23, no N/V. Last BM 4/23. +Flatus. No N/V. Miralax + bisacodyl x1 4/23. Pre-albumin 11.4. Reglan 53m IV q6h added 4/21. Endo: CBGs <180 Insulin requirements in the past 24 hours: 2 units Lytes: Na 133; CorrCa elevated at 10.3 (Alb 2.5); all others within normal limits Renal: Scr 1, BUN 16; UOP 0.8 ml/kg/hr, +4.7L over last 24h Pulm: RA Cards: BP soft, HR 90s, NSR Hepatobil: Hx hepatic steatosis and alcohol abuse. AST/ALT decreasing (39/66), Alk Phos 168; Tbili down to nml, TG 128 -[place] no acute cholecystitis Neuro: Alert and oriented x4. Abdominal pain with distension. ID: WBC within normal limits, Tm 100.4, superficial wound dehiscence 4/23, no antibiotics presently  TPN Access: PICC placement ordered 4/20 TPN start date: 4/20 Nutritional Goals (per RD recommendation on 4/20): Kcal:  1900-2100 Protein:  100-115 grams Fluid:  1.9 - 2.1 L/day  Goal TPN rate is 85 ml/hr (This TPN provides 108 g of protein, 277 g of dextrose, and 70 g of lipids which provides 1999 kCals per day, meeting 100% of patient needs)  Current Nutrition:  Sips and chips, clear liquids started today LR at 100 ml/hr  Plan:   Continue TPN at 85 mL/hr. This TPN provides 108 g of protein, 277 g of dextrose, and 70 g of lipids which provides 1999 kCals per day, meeting 100% of patient needs Electrolytes in TPN: remove Ca, continue increased Na and slightly increased Mag; others standard Add MVI to TPN - trace elements are on back order, will only place in TPN on Monday, Wednesday and Friday Add Thiamine and Folate to TPN Continue moderate SSI and adjust as needed Continue  LR at 100 ml/hr per MD - consider d/c Monitor TPN labs - will monitor liver function closely Monitor output and need to adjust IVF/Fluid goal F/U diet toleration and ability to wean TPN   JRenold Genta PharmD, BCPS Clinical Pharmacist Clinical phone for 02/11/2019 until 3p is [phone] 02/11/2019 7:17 AM  **Pharmacist phone directory can now be found on [email or web address] listed under [facility]*  Addendum: Ok to wean TPN per surgery Decrease TPN to 42.5 ml/hr for 2 hr then turn off Remove TPN orders D/C SSI with no hx DM  JRenold Genta PharmD, BCPS 02/11/2019 12:47 PM

## 2019-02-11 NOTE — Progress Notes (Signed)
Dressing changed wet to dry. Site had some bleeding. No foul odor or redness. Some staples removed from provider earlier, 3 staples remain towards the bottom of the surgical incision. Towards the top of incision, approximated edges separated (dehinced). Provider aware, area was packed prior to shift.

## 2019-02-11 NOTE — Progress Notes (Signed)
Physical Therapy Treatment Patient Details Name: Caleb Potter MRN: [id number] DOB: February 09, 1961 Today's Date: 02/11/2019    History of Present Illness Patient is a 58 yo male who came in for fall with significant abdominal pain found to have free fluid in abdomen and is now s/p X-lap with repair of the bladder rupture and drainage of the intraperitoneal urine. PMH including but not limited to CAD, HTN, MI.    PT Comments    Patient is making good progress with PT.  From a mobility standpoint anticipate patient will be ready for DC home when medically ready.   Follow Up Recommendations  No PT follow up     Equipment Recommendations  None recommended by PT    Recommendations for Other Services       Precautions / Restrictions Precautions Precautions: Fall    Mobility  Bed Mobility Overal bed mobility: Modified Independent Bed Mobility: Supine to Sit              Transfers Overall transfer level: Needs assistance Equipment used: None Transfers: Sit to/from Stand Sit to Stand: Supervision         General transfer comment: for safety  Ambulation/Gait Ambulation/Gait assistance: Supervision Gait Distance (Feet): 400 Feet Assistive device: None Gait Pattern/deviations: Step-through pattern;Decreased stride length     General Gait Details: pt with grossly steady gait; decreased cadence   Stairs Stairs: Yes Stairs assistance: Supervision Stair Management: Two rails;Forwards;Step to pattern Number of Stairs: (2 steps then 3 steps) General stair comments: supervision for safety   Wheelchair Mobility    Modified Rankin (Stroke Patients Only)       Balance Overall balance assessment: History of Falls;Needs assistance Sitting-balance support: Feet supported Sitting balance-Leahy Scale: Good     Standing balance support: No upper extremity supported Standing balance-Leahy Scale: Good                              Cognition  Arousal/Alertness: Awake/alert Behavior During Therapy: WFL for tasks assessed/performed Overall Cognitive Status: Within Functional Limits for tasks assessed                                        Exercises      General Comments        Pertinent Vitals/Pain Pain Assessment: No/denies pain    Home Living                      Prior Function            PT Goals (current goals can now be found in the care plan section) Progress towards PT goals: Progressing toward goals    Frequency    Min 5X/week      PT Plan Current plan remains appropriate    Co-evaluation              AM-PAC PT "6 Clicks" Mobility   Outcome Measure  Help needed turning from your back to your side while in a flat bed without using bedrails?: None Help needed moving from lying on your back to sitting on the side of a flat bed without using bedrails?: None Help needed moving to and from a bed to a chair (including a wheelchair)?: None Help needed standing up from a chair using your arms (e.g., wheelchair or bedside chair)?: None Help needed to  walk in hospital room?: A Little Help needed climbing 3-5 steps with a railing? : A Little 6 Click Score: 22    End of Session Equipment Utilized During Treatment: Gait belt Activity Tolerance: Patient tolerated treatment well Patient left: in chair;with call bell/phone within reach Nurse Communication: Mobility status PT Visit Diagnosis: History of falling (Z91.81)     Time: [id number] PT Time Calculation (min) (ACUTE ONLY): 22 min  Charges:  $Gait Training: 8-22 mins                     Erline LevineKellyn Tyrea Froberg, PTA Acute Rehabilitation Services Pager: [phone] Office: [phone]     Carolynne EdouardKellyn R Chanteria Haggard 02/11/2019, 4:45 PM

## 2019-02-11 NOTE — Consult Note (Addendum)
WOC Nurse wound consult note Reason for Consult: Consult requested for abd wound.  Urology team has ordered wet to dry dressings BID.  Agree with plan of care.  Wound type: Midline abd with 2 areas of full thickness post-op wounds; .8X.3X.2cm and 1.2X1.2X.3cm Wound bed: beefy red Drainage (amount, consistency, odor) no odor, small amt pink drainage Periwound: Intact skin surrounding Dressing procedure/placement/frequency: Pt states his wife is a Licensed conveyancer and will be able to assist with dressing changes at home.  Demonstrated procedure for moist gauze dressings and lightly packing the wounds, then covering with dry dressing and tape.  Pt watched procedure and asked appropriate questions. He should follow-up with the surgeon after discharge.  Please re-consult if further assistance is needed.  Thank-you,  Cammie Mcgee MSN, RN, CWOCN, [place], CNS [phone]

## 2019-02-11 NOTE — Discharge Instructions (Signed)
Wound Dehiscence  Wound dehiscence is when a cut from surgery (an incision) opens up and does not heal like it should. This problem usually happens 7-10 days after surgery. You may have bleeding from the cut. You may also have pain or a fever. This condition should be treated early. Follow these instructions at home: Medicines  Take over-the-counter and prescription medicines only as told by your doctor.  If you were prescribed an antibiotic medicine, take it as told by your doctor. Do not stop taking it even if you start to feel better.  Use medicines that help to stop itching as told by your doctor. The wound may feel itchy as it heals. Wound care   Follow instructions from your doctor about how to take care of your wound. Make sure you: ? Wash your hands with soap and water before you change your bandage (dressing) or wash the wound area. If you cannot use soap and water, use hand sanitizer. ? Wash your wound with mild soap and water 2 times a day, or as told. Rinse off the soap. Pat the area dry with a clean towel. Do not rub the wound. ? Change bandages as told by your doctor.  Do not pick or scratch at the wound.  Check your wound area every day for signs of infection. Check for: ? More redness, swelling, or pain. ? More fluid or blood. ? Warmth. ? Pus or a bad smell. Activity  Avoid exercises that make you sweat or could stretch your wound.  Do not lift anything that is heavier than 10 lb (4.5 kg) until the wound is healed or until your doctor says that it is safe. General instructions  Do not take baths, swim, or use a hot tub until your doctor approves. You may take showers.  Keep all follow-up visits as told by your doctor. This is important. Contact a doctor if:  Your wound does not seem to be healing right.  You have a fever. Get help right away if:  You have more redness, swelling, or pain around your wound.  You have more fluid coming from your  wound.  Your wound feels warm to the touch.  You have pus or a bad smell coming from your wound.  More of the wound breaks open.  You have red streaks spreading from your wound.  You have a lot of bleeding from your wound. Summary  Wound dehiscence is when a cut from surgery (an incision) opens up and does not heal like it should.  Follow instructions from your doctor about how to take care of your wound.  Check your wound area every day for signs of infection. These signs can be redness, swelling, pain, fluid, blood, warmth, pus, or a bad smell.  If you were prescribed an antibiotic medicine, take it as told by your doctor. Do not stop taking it even if you start to feel better.  Contact a doctor if your wound does not seem to be healing right. This information is not intended to replace advice given to you by your health care provider. Make sure you discuss any questions you have with your health care provider. Document Released: 09/24/2009 Document Revised: 09/10/2016 Document Reviewed: 09/10/2016 Elsevier Interactive Patient Education  2019 [employer] Indwelling Urinary Catheter Care, Adult An indwelling urinary catheter is a thin, flexible, germ-free (sterile) tube that is placed into the bladder to help drain urine out of the body. The catheter is inserted into the part of the  body that drains urine from the bladder (urethra). Urine drains from the catheter into a drainage bag outside of the body. Taking good care of your catheter will keep it working properly and help to prevent problems from developing. What are the risks?  Bacteria may get into your bladder and cause a urinary tract infection.  Urine flow can become blocked. This can happen if the catheter is not working correctly, or if you have sediment or a blood clot in your bladder or the catheter.  Tissue near the catheter may become irritated and bleed. How to wear your catheter and your drainage bag Supplies  needed  Adhesive tape or a leg strap.  Alcohol wipe or soap and water (if you use tape).  A clean towel (if you use tape).  Overnight drainage bag.  Smaller drainage bag (leg bag). Wearing your catheter and bag Use adhesive tape or a leg strap to attach your catheter to your leg.  Make sure the catheter is not pulled tight.  If a leg strap gets wet, replace it with a dry one.  If you use adhesive tape: 1. Use an alcohol wipe or soap and water to wash off any stickiness on your skin where you had tape before. 2. Use a clean towel to pat-dry the area. 3. Apply the new tape. You should have received a large overnight drainage bag and a smaller leg bag that fits underneath clothing.  You may wear the overnight bag at any time, but you should not wear the leg bag at night.  Always wear the leg bag below your knee.  Make sure the overnight drainage bag is always lower than the level of your bladder, but do not let it touch the floor. Before you go to sleep, hang the bag inside a wastebasket that is covered by a clean plastic bag. How to care for your skin around the catheter     Supplies needed  A clean washcloth.  Water and mild soap.  A clean towel. Caring for your skin and catheter  Every day, use a clean washcloth and soapy water to clean the skin around your catheter. ? Wash your hands with soap and water. ? Wet a washcloth in warm water and mild soap. ? Clean the skin around your urethra. ? If you are male: ? Use one hand to gently spread the folds of skin around your vagina (labia). ? With the washcloth in your other hand, wipe the inner side of your labia on each side. Do this in a front-to-back direction. ? If you are male: ? Use one hand to pull back any skin that covers the end of your penis (foreskin). ? With the washcloth in your other hand, wipe your penis in small circles. Start wiping at the tip of your penis, then move outward from the catheter. ? With  your free hand, hold the catheter close to where it enters your body. Keep holding the catheter during cleaning so it does not get pulled out. ? Use your other hand to clean the catheter with the washcloth. ? Only wipe downward on the catheter. ? Do not wipe upward toward your body, because that may push bacteria into your urethra and cause infection. ? Use a clean towel to pat-dry the catheter and the skin around it. Make sure to wipe off all soap. ? Wash your hands with soap and water.  Shower every day. Do not take baths.  Do not use cream, ointment, or lotion  on the area where the catheter enters your body, unless your health care provider tells you to do that.  Do not use powders, sprays, or lotions on your genital area.  Check your skin around the catheter every day for signs of infection. Check for: ? Redness, swelling, or pain. ? Fluid or blood. ? Warmth. ? Pus or a bad smell. How to empty the drainage bag Supplies needed  Rubbing alcohol.  Gauze pad or cotton ball.  Adhesive tape or a leg strap. Emptying the bag Empty your drainage bag (your overnight drainage bag or your leg bag) when it is ?- full, or at least 2-3 times a day. Clean the drainage bag according to the manufacturer's instructions or as told by your health care provider. 1. Wash your hands with soap and water. 2. Detach the drainage bag from your leg. 3. Hold the drainage bag over the toilet or a clean container. Make sure the drainage bag is lower than your hips and bladder. This stops urine from going back into the tubing and into your bladder. 4. Open the pour spout at the bottom of the bag. 5. Empty the urine into the toilet or container. Do not let the pour spout touch any surface. This precaution is important to prevent bacteria from getting in the bag and causing infection. 6. Apply rubbing alcohol to a gauze pad or cotton ball. 7. Use the gauze pad or cotton ball to clean the pour spout. 8. Close  the pour spout. 9. Attach the bag to your leg with adhesive tape or a leg strap. 10. Wash your hands with soap and water. How to change the drainage bag Supplies needed:  Alcohol wipes.  A clean drainage bag.  Adhesive tape or a leg strap. Changing the bag Replace your drainage bag with a clean bag once a month. Replace the bag sooner if it leaks, starts to smell bad, or looks dirty. 1. Wash your hands with soap and water. 2. Detach the dirty drainage bag from your leg. 3. Pinch the catheter with your fingers so that urine does not spill out. 4. Disconnect the catheter tube from the drainage tube at the connection valve. Do not let the tubes touch any surface. 5. Clean the end of the catheter tube with an alcohol wipe. Use a different alcohol wipe to clean the end of the drainage tube. 6. Connect the catheter tube to the drainage tube of the clean bag. 7. Attach the clean bag to your leg with adhesive tape or a leg strap. Avoid attaching the new bag too tightly. 8. Wash your hands with soap and water. General instructions   Never pull on your catheter or try to remove it. Pulling can damage your internal tissues.  Always wash your hands before and after you handle your catheter or drainage bag. Use a mild, fragrance-free soap. If soap and water are not available, use hand sanitizer.  Always make sure there are no twists or bends (kinks) in the catheter tube.  Always make sure there are no leaks in the catheter or drainage bag.  Drink enough fluid to keep your urine pale yellow.  Do not take baths, swim, or use a hot tub.  If you are male, wipe from front to back after having a bowel movement. Contact a health care provider if:  Your urine is cloudy.  Your urine smells unusually bad.  Your catheter gets clogged.  Your catheter starts to leak.  Your bladder feels full. Get  help right away if:  You have redness, swelling, or pain where the catheter enters your  body.  You have fluid, blood, pus, or a bad smell coming from the area where the catheter enters your body.  The area where the catheter enters your body feels warm to the touch.  You have a fever.  You have pain in your abdomen, legs, lower back, or bladder.  You see blood in the catheter.  Your urine is pink or red.  You have nausea, vomiting, or chills.  Your urine is not draining into the bag.  Your catheter gets pulled out. Summary  An indwelling urinary catheter is a thin, flexible, germ-free (sterile) tube that is placed into the bladder to help drain urine out of the body.  The catheter is inserted into the part of the body that drains urine from the bladder (urethra).  Take good care of your catheter to keep it working properly and help prevent problems from developing.  Always wash your hands before and after you handle your catheter or drainage bag.  Never pull on your catheter or try to remove it. This information is not intended to replace advice given to you by your health care provider. Make sure you discuss any questions you have with your health care provider. Document Released: 10/06/2005 Document Revised: 03/29/2018 Document Reviewed: 05/22/2017 Elsevier Interactive Patient Education  2019 [employer].

## 2019-02-11 NOTE — Progress Notes (Signed)
NG tubed removed. Pt tolerated well. Pt ambulated to chair at this time.  Caleb Potter

## 2019-02-11 NOTE — Progress Notes (Signed)
12 Days Post-Op  Subjective: He is doing better with 2 BM's and no nausea with clamped NG.  Superficial wound dehiscence noted on staple removal yesterday. Dressing changes instituted.   He is having discomfort from the NG and would like it removed.   He has no other complaints. Tmax 100.4. WBC normal and Chemistries stable.   ROS:  Review of Systems  All other systems reviewed and are negative.   Anti-infectives: Anti-infectives (From admission, onward)   Start     Dose/Rate Route Frequency Ordered Stop   02/01/19 0100  cefTRIAXone (ROCEPHIN) 2 g in sodium chloride 0.9 % 100 mL IVPB  Status:  Discontinued     2 g 200 mL/hr over 30 Minutes Intravenous Every 24 hours 01/31/19 0223 02/03/19 0855   01/31/19 0030  cefTRIAXone (ROCEPHIN) 2 g in sodium chloride 0.9 % 100 mL IVPB     2 g 200 mL/hr over 30 Minutes Intravenous  Once 01/31/19 0015 01/31/19 0240      Current Facility-Administered Medications  Medication Dose Route Frequency Provider Last Rate Last Dose  . 0.9 %  sodium chloride infusion   Intravenous Continuous Tat, David, MD 100 mL/hr at 02/10/19 1825    . acetaminophen (TYLENOL) tablet 650 mg  650 mg Oral Q6H PRN Opyd, Lavone Neri, MD       Or  . acetaminophen (TYLENOL) suppository 650 mg  650 mg Rectal Q6H PRN Opyd, Lavone Neri, MD      . diphenhydrAMINE (BENADRYL) injection 12.5-25 mg  12.5-25 mg Intravenous Q6H PRN Bjorn Pippin, MD       Or  . diphenhydrAMINE (BENADRYL) 12.5 MG/5ML elixir 12.5-25 mg  12.5-25 mg Oral Q6H PRN Bjorn Pippin, MD      . finasteride (PROSCAR) tablet 5 mg  5 mg Oral Daily Bjorn Pippin, MD   5 mg at 02/10/19 1111  . fluticasone furoate-vilanterol (BREO ELLIPTA) 100-25 MCG/INH 1 puff  1 puff Inhalation Daily Bjorn Pippin, MD   1 puff at 02/10/19 0752  . heparin injection 5,000 Units  5,000 Units Subcutaneous Q8H Bjorn Pippin, MD   5,000 Units at 02/11/19 0537  . HYDROcodone-acetaminophen (NORCO/VICODIN) 5-325 MG per tablet 1-2 tablet  1-2 tablet Oral Q4H  PRN Opyd, Lavone Neri, MD      . HYDROmorphone (DILAUDID) injection 0.5-1 mg  0.5-1 mg Intravenous Q2H PRN Bjorn Pippin, MD   1 mg at 02/02/19 2104  . insulin aspart (novoLOG) injection 0-15 Units  0-15 Units Subcutaneous Q4H [facility], [facility]   2 Units at 02/11/19 4401  . LORazepam (ATIVAN) injection 1 mg  1 mg Intravenous Q6H PRN Mariea Clonts, Courage, MD   1 mg at 02/09/19 1334  . menthol-cetylpyridinium (CEPACOL) lozenge 3 mg  1 lozenge Oral PRN Focht, Jessica L, PA   3 mg at 02/08/19 1453  . metoCLOPramide (REGLAN) injection 10 mg  10 mg Intravenous Q6H Manus Rudd, MD   10 mg at 02/11/19 0536  . metoprolol tartrate (LOPRESSOR) injection 5 mg  5 mg Intravenous Q6H Emokpae, Courage, MD   5 mg at 02/11/19 0536  . morphine 4 MG/ML injection 4 mg  4 mg Intravenous Q4H PRN Opyd, Lavone Neri, MD      . ondansetron (ZOFRAN) tablet 4 mg  4 mg Oral Q6H PRN Opyd, Lavone Neri, MD       Or  . ondansetron (ZOFRAN) injection 4 mg  4 mg Intravenous Q6H PRN Opyd, Lavone Neri, MD   4 mg at 02/01/19 1237  .  oxyCODONE (Oxy IR/ROXICODONE) immediate release tablet 5 mg  5 mg Oral Q4H PRN Bjorn PippinWrenn, Keia Rask, MD      . senna-docusate (Senokot-S) tablet 1 tablet  1 tablet Oral QHS PRN Bjorn PippinWrenn, Claudie Brickhouse, MD      . sodium chloride flush (NS) 0.9 % injection 10-40 mL  10-40 mL Intracatheter Q12H Bjorn PippinWrenn, Henretta Quist, MD   10 mL at 02/09/19 1004  . sodium chloride flush (NS) 0.9 % injection 10-40 mL  10-40 mL Intracatheter PRN Bjorn PippinWrenn, Kace Hartje, MD      . sodium phosphate (FLEET) 7-19 GM/118ML enema 1 enema  1 enema Rectal Once PRN Bjorn PippinWrenn, Gustav Knueppel, MD      . TPN ADULT (ION)   Intravenous Continuous TPN Quenton FetterMillen, Jessica B, RPH 85 mL/hr at 02/10/19 1825    . umeclidinium bromide (INCRUSE ELLIPTA) 62.5 MCG/INH 1 puff  1 puff Inhalation Daily Bjorn PippinWrenn, Sameer Teeple, MD   1 puff at 02/10/19 0752  . zolpidem (AMBIEN) tablet 5 mg  5 mg Oral QHS PRN,MR X 1 Bjorn PippinWrenn, Amariyon Maynes, MD         Objective: Vital signs in last 24 hours: Temp:  [98 F (36.7 C)-100.4 F (38 C)] 98.7 F  (37.1 C) (04/24 0549) Pulse Rate:  [88-104] 104 (04/24 0437) Resp:  [17-19] 18 (04/24 0437) BP: (94-117)/(61-82) 112/61 (04/24 0437) SpO2:  [92 %-96 %] 92 % (04/24 0549)  Intake/Output from previous day: 04/23 0701 - 04/24 0700 In: 7042.1 [P.O.:840; I.V.:5802.1] Out: 2251 [Urine:1950; Emesis/NG output:300; Stool:1] Intake/Output this shift: No intake/output data recorded.   Physical Exam Vitals signs reviewed.  Constitutional:      Appearance: Normal appearance. He is obese.  Neck:     Musculoskeletal: Normal range of motion and neck supple.  Cardiovascular:     Rate and Rhythm: Regular rhythm. Tachycardia present.     Heart sounds: Normal heart sounds.  Pulmonary:     Effort: Pulmonary effort is normal. No respiratory distress.     Breath sounds: Normal breath sounds.  Abdominal:     General: Bowel sounds are normal. There is no distension.     Palpations: Abdomen is soft.     Tenderness: There is no abdominal tenderness.     Comments: Inferior wound staples intact without drainage or erythema.  Superior wound dressing dry.    Musculoskeletal: Normal range of motion.        General: No swelling or tenderness.  Skin:    General: Skin is warm and dry.  Neurological:     General: No focal deficit present.     Mental Status: He is alert and oriented to person, place, and time.  Psychiatric:        Mood and Affect: Mood normal.        Behavior: Behavior normal.     Lab Results:  Recent Labs    02/11/19 0500  WBC 9.4  HGB 11.5*  HCT 35.3*  PLT 337   BMET Recent Labs    02/10/19 0500 02/11/19 0500  NA 134* 133*  K 4.5 4.6  CL 100 100  CO2 27 24  GLUCOSE 137* 131*  BUN 17 16  CREATININE 0.89 1.00  CALCIUM 9.4 9.3   PT/INR No results for input(s): LABPROT, INR in the last 72 hours. ABG No results for input(s): PHART, HCO3 in the last 72 hours.  Invalid input(s): PCO2, PO2  Studies/Results: No results found.   Assessment and Plan: POD #12 from  repair of bladder rupture secondary to BPH with BOO and clot  retention.  Foley draining clear urine.  He will need to keep foley until office f/u.  He has been started on finasteride and will need to resume tamsulosin.   Prolonged postop ileus requiring NG drainage and TPN now improving.  2 BM's and excellent BS.   I will D/C the NG and initiate clear liquids.  Appreciate surgery input.  Superficial wound dehiscence.  Continue wet to drys and I will consult wound team.    Elevated LFT's stable on TPN.    Hx of AKI from peritoneal urinary absorption.  Resolved.       LOS: 12 days    Bjorn Pippin 02/11/2019 [phone] ID: Ray Church, male   DOB: [birth year], 58 y.o.   MRN: [id number]

## 2019-02-11 NOTE — Progress Notes (Signed)
PROGRESS NOTE  Caleb Potter WNU:[id number] DOB: 1961/04/09 DOA: 01/30/2019 PCP: [name], MD  Brief History: 58 y.o.malewith a history of HTN, CAD, and nephrolithiasis who presented to the [facility] ED with abdominal pain found to have intraperitoneal fluid, suspected hemorrhage on CT abd/pelvis with subsequent CT pyelogram demonstrating ruptured bladder and hyperkalemic AKI with creatinine 7.7, K 6.1. Urology performed ex lap with bladder repair 4/13. IV fluids, lokelma, bicarbonate, and calcium gluconate were provided as well. Postoperatively renal function and electrolytes have normalized, though he has an ileus vs. partial SBO with NG tube placed. Abdominal XR and CT abdomen and pelvis with contrast shows persistent dilated loops of small bowel.Pt started onIV TPN pending return of bowel function  Assessment/Plan: Post-op Ileus -appreciate general surgery follow up -repeatabdx-rays on 4/21/20with generalized ileus -had 2 BMs am 4/24 -NG removed 4/24 -4/24--started clears -weaning TPN -diet advancement per surgery -miralax and bisacodyl  Hypercalcemia -pt had "normal" calcium on day of admission -likely multifactorial including TPN, LR, immobility -d/c LR -started NS>>saline lock now that calcium removed from TPN and advancing diet -intact PTH--12 -4/24 corrected calcium 10.5 -would repeat CMP 1 week after discharge and pt is stable and tolerating diet to re-assess calcium  AKI -due to BOO -resolved -hold lisinopril  Transaminasemia -hepatic steatosis and TPN -continue to trend--> trending down -4/22 RUQ US--cholelithiasis without biliary ductal dilatation; hepatic steatosis  Bladder Rupture -Exploratory laparotomy with repair of bladder rupture.22Fr foley. NG, JP, Dr. Annabell Howells, 01/31/19 POD#12  Essential Hypertension -continue IV metoprolol until able to take po>>>transition to po metoprolol -holding lisinopril due to soft BPs and  AKI -controlled  COPD -stable -continue incruse and Breo  Alcohol abuse -last Etoh 4 days PTA -no signs of withdrawal  Coronary artery disease -no chest pain -resume ASA and statin 4/25  Hyperglycemia/Impaired glucose tolerance -partly due to TPN -check A1C--5.7      Disposition Plan: Home when bowel function returns; likely 4/25 or 4/26 Family Communication:NoFamily at bedside  Consultants:general surgery; urology is primary  Code Status: FULL   DVT Prophylaxis:  Heparin   Procedures: As Listed in Progress Note Above  Antibiotics: None      Subjective:   Objective: Vitals:   02/11/19 0825 02/11/19 0849 02/11/19 1209 02/11/19 1213  BP: 121/82   107/72  Pulse: 93 94 (!) 109 (!) 107  Resp: Temp: 98.4 F (36.9 C)   98.5 F (36.9 C)  TempSrc: Oral   Oral  SpO2: 93% 93% 94% 97%  Weight:      Height:        Intake/Output Summary (Last 24 hours) at 02/11/2019 1519 Last data filed at 02/11/2019 1225 Gross per 24 hour  Intake 4617.08 ml  Output 2201 ml  Net 2416.08 ml   Weight change:  Exam:   General:  Pt is alert, follows commands appropriately, not in acute distress  HEENT: No icterus, No thrush, No neck mass, [place]/AT  Cardiovascular: RRR, S1/S2, no rubs, no gallops  Respiratory: CTA bilaterally, no wheezing, no crackles, no rhonchi  Abdomen: Soft/+BS, non tender, non distended, no guarding  Extremities: No edema, No lymphangitis, No petechiae, No rashes, no synovitis   Data Reviewed: I have personally reviewed following labs and imaging studies Basic Metabolic Panel: Recent Labs  Lab 02/06/19 0440 02/08/19 0509 02/09/19 0746 02/10/19 0500 02/11/19 0500  NA 135 134* 132* 134* 133*  K 4.9 4.3 4.4 4.5 4.6  CL 97* 98 99 100 100  CO2 GLUCOSE 129* 122* 133* 137* 131*  BUN CREATININE 1.11 1.09 0.89 0.89 1.00  CALCIUM 9.2 9.1 9.2 9.4 9.3  MG  --  1.9 1.9 2.0   --   PHOS  --  4.1 3.5 3.6  --    Liver Function Tests: Recent Labs  Lab 02/08/19 0509 02/09/19 0746 02/10/19 0500 02/11/19 0500  AST 87* 52* 43* 39  ALT 103* 81* 76* 66*  ALKPHOS 153* 134* 153* 168*  BILITOT 3.0* 1.6* 1.2 1.1  PROT 6.2* 5.9* 6.2* 6.0*  ALBUMIN 2.5* 2.4* 2.5* 2.5*   No results for input(s): LIPASE, AMYLASE in the last 168 hours. No results for input(s): AMMONIA in the last 168 hours. Coagulation Profile: No results for input(s): INR, PROTIME in the last 168 hours. CBC: Recent Labs  Lab 02/05/19 1206 02/06/19 0440 02/08/19 0509 02/11/19 0500  WBC 8.1 9.6 8.7 9.4  NEUTROABS  --   --  5.2  --   HGB 12.2* 12.5* 11.8* 11.5*  HCT 37.2* 38.9* 36.5* 35.3*  MCV 97.4 98.7 97.6 97.5  PLT 222 262 275 337   Cardiac Enzymes: No results for input(s): CKTOTAL, CKMB, CKMBINDEX, TROPONINI in the last 168 hours. BNP: Invalid input(s): POCBNP CBG: Recent Labs  Lab 02/10/19 1929 02/11/19 0000 02/11/19 0439 02/11/19 0820 02/11/19 1154  GLUCAP 97 108* 121* 124* 146*   HbA1C: Recent Labs    02/11/19 0500  HGBA1C 5.7*   Urine analysis:    Component Value Date/Time   COLORURINE YELLOW 01/31/2019 0312   APPEARANCEUR CLEAR 01/31/2019 0312   LABSPEC 1.015 01/31/2019 0312   PHURINE 5.0 01/31/2019 0312   GLUCOSEU 50 (A) 01/31/2019 0312   HGBUR LARGE (A) 01/31/2019 0312   BILIRUBINUR NEGATIVE 01/31/2019 0312   KETONESUR NEGATIVE 01/31/2019 0312   PROTEINUR NEGATIVE 01/31/2019 0312   NITRITE NEGATIVE 01/31/2019 0312   LEUKOCYTESUR NEGATIVE 01/31/2019 0312   Sepsis Labs: (procalcitonin:4,lacticidven:4) )No results found for this or any previous visit (from the past 240 hour(s)).   Scheduled Meds:  feeding supplement  1 Container Oral TID BM   finasteride  5 mg Oral Daily   fluticasone furoate-vilanterol  1 puff Inhalation Daily   [START ON 02/12/2019] folic acid  1 mg Oral Daily   heparin injection (subcutaneous)  5,000 Units Subcutaneous  Q8H   insulin aspart  0-15 Units Subcutaneous Q4H   metoCLOPramide (REGLAN) injection  10 mg Intravenous Q6H   metoprolol tartrate  5 mg Intravenous Q6H   [START ON 02/12/2019] multivitamin with minerals  1 tablet Oral Daily   sodium chloride flush  10-40 mL Intracatheter Q12H   [START ON 02/12/2019] thiamine  100 mg Oral Daily   umeclidinium bromide  1 puff Inhalation Daily   Continuous Infusions:  TPN ADULT (ION) 42.5 mL/hr at 02/11/19 1354    Procedures/Studies: Ct Abdomen Pelvis Wo Contrast  Result Date: 02/04/2019 CLINICAL DATA:  Bowel obstruction versus ileus. Bladder rupture on Sunday. Laparotomy 01/30/2019 with bladder repair. EXAM: CT ABDOMEN AND PELVIS WITHOUT CONTRAST TECHNIQUE: Multidetector CT imaging of the abdomen and pelvis was performed following the standard protocol without IV contrast. COMPARISON:  Plain films 02/04/2019.  Most recent CT 01/30/2019 FINDINGS: Lower chest: Right base subsegmental atelectasis. Normal heart size without pericardial or pleural effusion. Multivessel coronary artery atherosclerosis. Hepatobiliary: Mild hepatic steatosis. Normal gallbladder, without biliary ductal dilatation. Pancreas: Normal, without mass or ductal dilatation. Spleen: Normal  in size, without focal abnormality. Adrenals/Urinary Tract: Normal adrenal glands. 3 mm interpolar right renal collecting system calculus. No hydronephrosis. Foley catheter within the urinary bladder. Stomach/Bowel: Nasogastric tube terminates at the gastric body. The colon is normal in caliber. The terminal ileum is normal, including on image 60/3. Proximal and mid small bowel loops are dilated, including at up to 5.6 cm on image 50/3. The mid to distal ileum undergoes a gradual transition to normal caliber, without focal abnormality. No findings to suggest ischemia. Vascular/Lymphatic: Aortic and branch vessel atherosclerosis. No abdominopelvic adenopathy. Reproductive: Mild prostatomegaly. Other: A surgical  drain from a left pelvic approach terminates in the cul-de-sac. No residual abdominopelvic fluid. No free intraperitoneal air. Musculoskeletal: Moderate left-sided gynecomastia. Lumbosacral spondylosis. IMPRESSION: 1. Relatively diffuse small bowel dilatation, without focal transition point. Findings are most consistent with postoperative adynamic ileus. 2. Right nephrolithiasis. 3.  Aortic Atherosclerosis (ICD10-I70.0). 4. Hepatic steatosis. 5. Left gynecomastia. Electronically Signed   By: Jeronimo Greaves M.D.   On: 02/04/2019 14:41   Ct Abdomen Pelvis Wo Contrast  Result Date: 01/30/2019 CLINICAL DATA:  Gross hematuria.  Bilateral flank pain after fall. EXAM: CT ABDOMEN AND PELVIS WITHOUT CONTRAST TECHNIQUE: Multidetector CT imaging of the abdomen and pelvis was performed following the standard protocol without IV contrast. COMPARISON:  CT scan of January 26, 2019. FINDINGS: Lower chest: No acute abnormality. Hepatobiliary: No gallstones or biliary dilatation is noted. Mild amount of fluid is noted around the liver with dense material seen posteriorly consistent with hematoma. Pancreas: Unremarkable. No pancreatic ductal dilatation or surrounding inflammatory changes. Spleen: Fluid is noted around the spleen which was not present on prior exam and is concerning for hemorrhage. Adrenals/Urinary Tract: Adrenal glands appear normal. Nonobstructive right renal calculus is noted. No hydronephrosis or renal obstruction is noted. The amount of hemorrhage seen in urinary bladder on prior exam is significantly decreased, although residual hemorrhage does remain in the dependent portion of the bladder. Stomach/Bowel: The stomach appears normal. There is no evidence of bowel obstruction or inflammation. The appendix is not clearly visualized. Vascular/Lymphatic: Aortic atherosclerosis. No enlarged abdominal or pelvic lymph nodes. Reproductive: Stable mild prostatic enlargement is noted. Other: There does appear to be a  moderate amount of hemorrhage present in the dependent portion of the pelvis as well as in the left pericolic gutter. Musculoskeletal: No acute or significant osseous findings. IMPRESSION: There is interval development of a moderate amount of intraperitoneal hemorrhage seen in the pelvis, both pericolic gutters, and around the liver and spleen. Critical Value/emergent results were called by telephone at the time of interpretation on 01/30/2019 at 8:29 pm to Dr. Terance Hart , who verbally acknowledged these results. The large amount of hemorrhage seen within the urinary bladder on prior exam has significantly improved, with only a mild amount of residual hemorrhage or thrombus seen posteriorly in the urinary bladder on current exam. Nonobstructive right renal calculus. No hydronephrosis or renal obstruction is noted. Stable mild prostatic enlargement. Electronically Signed   By: Lupita Raider, M.D.   On: 01/30/2019 20:30   Dg Abd 1 View  Result Date: 02/01/2019 CLINICAL DATA:  Enteric tube placement EXAM: ABDOMEN - 1 VIEW COMPARISON:  None. FINDINGS: The tip of the enteric tube is at the expected location of the gastroduodenal junction or first portion of the duodenum. The side port is still likely within the stomach. There are multiple dilated loops of bowel throughout the abdomen. IMPRESSION: Enteric tube tip near the gastroduodenal junction. Electronically Signed   By:  Deatra RobinsonKevin  Herman M.D.   On: 02/01/2019 14:09   Ct Pelvis Wo Contrast  Result Date: 01/30/2019 CLINICAL DATA:  Considerable free fluid within the abdomen on recent CT, request is been made for CT cystography. EXAM: CT PELVIS WITHOUT CONTRAST TECHNIQUE: Multidetector CT imaging of the pelvis was performed following the standard protocol without intravenous contrast. COMPARISON:  CT from earlier in the same day. FINDINGS: Urinary Tract: Contrast material is been instilled through the Foley catheter. The Foley catheter balloon is well visualized.  The bladder distends well and demonstrates diffuse intraluminal filling defect consistent with residual thrombus. Additionally there is extravasation of contrast material from the dome of the bladder in a linear fashion which extends for approximately 2.9 cm consistent with bladder rupture. Contrast enhancement of the intraperitoneal fluid seen on the recent CT examination consistent with the extravasation. Bowel:  Bowel is again within normal limits. Vascular/Lymphatic: Atherosclerotic calcifications are seen. No lymphadenopathy is noted. Reproductive: Prostate is mildly prominent but stable from the recent exam. Other: Free fluid is again noted within the abdomen and pelvis related to the bladder perforation. Musculoskeletal: Stable in appearance when compare with the prior CT. IMPRESSION: CT cystography reveals a long segment of bladder discontinuity along the dome consistent with rupture. Passage of contrast material into the peritoneal cavity is seen. Retained thrombus within the bladder is noted. Electronically Signed   By: Alcide CleverMark  Lukens M.D.   On: 01/30/2019 23:20   Dg Chest Portable 1 View  Result Date: 01/30/2019 CLINICAL DATA:  Chest pain EXAM: PORTABLE CHEST 1 VIEW COMPARISON:  11/03/2014 FINDINGS: Cardiac shadows within normal limits. Tortuosity of the thoracic aorta is identified somewhat accentuated by the portable technique and patient rotation. The lungs are clear. No acute bony abnormality is noted. IMPRESSION: No acute abnormality noted. Electronically Signed   By: Alcide CleverMark  Lukens M.D.   On: 01/30/2019 19:55   Dg Abd Acute 2+v W 1v Chest  Result Date: 02/08/2019 CLINICAL DATA:  Followup postoperative ileus. Persistent abdominal pain and abdominal distension. EXAM: DG ABDOMEN ACUTE W/ 1V CHEST COMPARISON:  02/05/2019 and earlier, including CT abdomen and pelvis 02/04/2019 and earlier. Chest x-ray 01/30/2019 and earlier. FINDINGS: Marked gaseous distension of the small bowel diffusely and  moderate gaseous distention of the ascending and transverse colon has not significantly changed over the past 6 days. The descending colon, sigmoid colon and rectum are decompressed currently. Contrast material from the CT is present throughout the colon. No evidence of free intraperitoneal air on the [employer] image. Nasogastric to courses through the stomach with its tip in the proximal duodenum as noted on the recent CT. Cardiac silhouette upper normal in size, unchanged. Lungs remain clear. RIGHT arm PICC tip projects over the LOWER SVC. IMPRESSION: 1. Stable severe generalized ileus (favored over small bowel obstruction). No free intraperitoneal air. 2. No acute cardiopulmonary disease. Electronically Signed   By: Hulan Saashomas  Lawrence M.D.   On: 02/08/2019 09:46   Dg Abd Portable 1v  Result Date: 02/07/2019 CLINICAL DATA:  58 year old male with abdominal distension EXAM: PORTABLE ABDOMEN - 1 VIEW COMPARISON:  Prior abdominal radiographs 02/05/2019 FINDINGS: Persistent gaseous distension of small bowel throughout the abdomen. The maximal diameter is 5.7 cm. A gastric tube remains present. The tip overlies the gastric antrum. Previously ingested contrast material opacifies the colon. The colon is not distended. A surgical drain projects over the anatomic pelvis. A staple line is present. The lung bases are clear. IMPRESSION: 1. Persistent gaseous distension of small bowel in the mid  abdomen. Contrast material is now present throughout the colon. Findings suggest partial small bowel obstruction versus postoperative small bowel ileus. 2. Well-positioned gastrostomy tube with the tip in the region of the gastric antrum. Electronically Signed   By: Malachy Moan M.D.   On: 02/07/2019 08:13   Dg Abd Portable 1v  Result Date: 02/05/2019 CLINICAL DATA:  Postoperative ileus. Hx of recent repair of ruptured bladder. Pt denies abdominal pain and constipation, stating he has only had ice and water to eat and drink and  doesn't expect to be able to have a bowel movement without food. EXAM: PORTABLE ABDOMEN - 1 VIEW COMPARISON:  None. FINDINGS: There is a nasogastric tube with the tip projecting over the antrum of the stomach/pylorus. There is gaseous distension of small bowel and colon most consistent with a postoperative ileus. There is no evidence of pneumoperitoneum, portal venous gas or pneumatosis. There are no pathologic calcifications along the expected course of the ureters. The osseous structures are unremarkable. IMPRESSION: 1. Nasogastric tube with the tip projecting over the antrum of the stomach/pylorus. 2. Persistent gaseous distension of the small bowel and colon consistent with postoperative ileus. Electronically Signed   By: Elige Ko   On: 02/05/2019 13:35   Dg Abd Portable 1v  Result Date: 02/04/2019 CLINICAL DATA:  Ileus. EXAM: PORTABLE ABDOMEN - 1 VIEW COMPARISON:  One-view abdomen 02/01/2019 FINDINGS: Multiple dilated loops of small bowel are again noted. NG tube is in place. The stomach is decompressed. Overall bowel dilation is improved. IMPRESSION: 1. Improving dilation of small bowel. Electronically Signed   By: Marin Roberts M.D.   On: 02/04/2019 08:35   Dg Abd Portable 1v  Result Date: 02/01/2019 CLINICAL DATA:  Abdominal distension. EXAM: PORTABLE ABDOMEN - 1 VIEW COMPARISON:  CT scan January 30, 2019 FINDINGS: Lung bases are normal. No free air, portal venous gas, or pneumatosis on the supine view. Dilated loops of small bowel are identified. There is also air seen within the ascending and transverse colon which are mildly prominent. The dilated small bowel loops measure up to 5.2 cm. The transverse colon measures 6.6 cm. No other acute abnormalities. IMPRESSION: 1. Dilated loops of small bowel and mildly prominent loops of colon. The small bowel appears to be dilated out of proportion to the colon suggesting early or partial small bowel obstruction. Given the amount of air in the  colon, a developing ileus is also a possibility. Recommend clinical correlation. A CT scan could further evaluate if clinically warranted. Otherwise, recommend attention on close follow-up. These results will be called to the ordering clinician or representative by the Radiologist Assistant, and communication documented in the PACS or zVision Dashboard. Electronically Signed   By: Gerome Sam III M.D   On: 02/01/2019 08:18   [place] Ekg Site Rite  Result Date: 02/07/2019 If Site Rite image not attached, placement could not be confirmed due to current cardiac rhythm.  US Abdomen Limited Ruq  Result Date: 02/09/2019 CLINICAL DATA:  General abdominal pain. EXAM: ULTRASOUND ABDOMEN LIMITED RIGHT UPPER QUADRANT COMPARISON:  CT 02/04/2019 FINDINGS: Gallbladder: Echogenic foci in the gallbladder suggestive for small stones and sludge. No gallbladder wall thickening. Patient does not have a sonographic Murphy sign. Common bile duct: Diameter: 0.4 cm Liver: Liver is mildly heterogeneous. No focal lesion. Overall, increased echogenicity in the liver compared to the adjacent right kidney. Portal vein is patent on color Doppler imaging with normal direction of blood flow towards the liver. IMPRESSION: 1. Echogenic material in the  gallbladder is suggestive for small stones and sludge. No sonographic evidence for acute cholecystitis. No biliary dilatation. 2. Liver is mildly echogenic and heterogeneous. Findings could be associated with steatosis. Electronically Signed   By: Richarda Overlie M.D.   On: 02/09/2019 08:22    Catarina Hartshorn, DO  [facility] Pager [phone]  If 7PM-7AM, please contact night-coverage [email or web address] Password [facility] 02/11/2019, 3:19 PM   LOS: 12 days

## 2019-02-11 NOTE — Progress Notes (Signed)
Nutrition Follow-up   RD working remotely.  DOCUMENTATION CODES:   Obesity unspecified  INTERVENTION:  TPN per pharmacy.   Provide Boost Breeze po TID, each supplement provides 250 kcal and 9 grams of protein.  Encourage adequate PO intake.   NUTRITION DIAGNOSIS:   Inadequate oral intake related to inability to eat as evidenced by NPO status; diet advanced; progressing  GOAL:   Patient will meet greater than or equal to 90% of their needs; met with TPN  MONITOR:   PO intake, Supplement acceptance, Diet advancement, Weight trends, Labs, Skin, I & O's  REASON FOR ASSESSMENT:   NPO/Clear Liquid Diet    ASSESSMENT:   58 y.o. male with a history of HTN, CAD, and nephrolithiasis who presented to the [facility] ED with abdominal pain found to have intraperitoneal fluid, suspected hemorrhage on CT abd/pelvis with subsequent CT pyelogram demonstrating ruptured bladder. Urology performed ex lap with bladder repair 4/13.  Post op pt with  ileus vs. partial SBO  Per MD, ileus improving. NGT removed today. Pt with no nausea. Per MD, may advance diet if pt po well with clear liquids. Plans to wean off TPN tomorrow. Per Pharmacy, TPN continues at goal rate of 85 ml/hr to provide 1999 kcal (100% of needs) and 108 grams of protein. RD to order nutritional supplements to aid in po intake. Labs and medications reviewed.    Diet Order:   Diet Order            Diet clear liquid Room service appropriate? No; Fluid consistency: Thin  Diet effective now              EDUCATION NEEDS:   Not appropriate for education at this time  Skin:  Skin Assessment: Skin Integrity Issues: Skin Integrity Issues:: Incisions Incisions: abdomen  Last BM:  4/23  Height:   Ht Readings from Last 1 Encounters:  02/04/19 '5\' 6"'$  (1.676 m)    Weight:   Wt Readings from Last 1 Encounters:  02/09/19 98.9 kg    Ideal Body Weight:  64.5 kg  BMI:  Body mass index is 35.19 kg/m.  Estimated Nutritional  Needs:   Kcal:  1900-2100  Protein:  100-115 grams  Fluid:  1.9 - 2.1 L/day   Corrin Parker, MS, RD, LDN Pager # [phone] After hours/ weekend pager # [phone]

## 2019-02-11 NOTE — Progress Notes (Signed)
12 Days Post-Op    CC:  ruptured bladder  Subjective: Pt up in chair again this AM.  Dr. Annabell Howells has pulled his NG, and started clears.  Large BM yesterday and this AM.  He is feeling better asking about going home.  Objective: Vital signs in last 24 hours: Temp:  [98.4 F (36.9 C)-100.4 F (38 C)] 98.4 F (36.9 C) (04/24 0825) Pulse Rate:  [90-104] 94 (04/24 0849) Resp:  [17-19] 18 (04/24 0849) BP: (102-121)/(61-82) 121/82 (04/24 0825) SpO2:  [92 %-96 %] 93 % (04/24 0849) Last BM Date: 02/10/19  Intake/Output from previous day: 04/23 0701 - 04/24 0700 In: 7042.1 [P.O.:840; I.V.:5802.1] Out: 2251 [Urine:1950; Emesis/NG output:300; Stool:1] Intake/Output this shift: No intake/output data recorded.  General appearance: alert, cooperative and no distress GI: large abdomen, not overly tender, few BS, + BM x 2  Lab Results:  Recent Labs    02/11/19 0500  WBC 9.4  HGB 11.5*  HCT 35.3*  PLT 337    BMET Recent Labs    02/10/19 0500 02/11/19 0500  NA 134* 133*  K 4.5 4.6  CL 100 100  CO2 27 24  GLUCOSE 137* 131*  BUN 17 16  CREATININE 0.89 1.00  CALCIUM 9.4 9.3   PT/INR No results for input(s): LABPROT, INR in the last 72 hours.  Recent Labs  Lab 02/08/19 0509 02/09/19 0746 02/10/19 0500 02/11/19 0500  AST 87* 52* 43* 39  ALT 103* 81* 76* 66*  ALKPHOS 153* 134* 153* 168*  BILITOT 3.0* 1.6* 1.2 1.1  PROT 6.2* 5.9* 6.2* 6.0*  ALBUMIN 2.5* 2.4* 2.5* 2.5*     Lipase  No results found for: LIPASE   Medications: . finasteride  5 mg Oral Daily  . fluticasone furoate-vilanterol  1 puff Inhalation Daily  . heparin injection (subcutaneous)  5,000 Units Subcutaneous Q8H  . insulin aspart  0-15 Units Subcutaneous Q4H  . metoCLOPramide (REGLAN) injection  10 mg Intravenous Q6H  . metoprolol tartrate  5 mg Intravenous Q6H  . sodium chloride flush  10-40 mL Intracatheter Q12H  . umeclidinium bromide  1 puff Inhalation Daily   . TPN ADULT (ION) 85 mL/hr at  02/10/19 1825  . TPN ADULT (ION)      Assessment/Plan Clot retention with intraperitoneal bladder rupture and AKI Bladder rupture Exploratory laparotomy with repair of bladder rupture.22Fr foley. NG, JP, Dr. Annabell Howells, 01/31/19 POD#11  Postop ileus - AXR 04/20showed gaseous distention of sm bowel and colon - NGT, IVF until return of bowel function >> NG out 4/24 after + BM >> clear liquids - encourage ambulation  Moderate Malnutrition - Prealbumin 11.4  Elevated LFT's - stable  Hx of chronic constipation:   On Miralax at home.    FEN: IV fluids/TPN/Clear liquids ID: Rocephin 4/13-4/16/20 DVT: Heparin Follow up: TBD  Plan:  If he does well with clears we can advance his diet, wean TNA tomorrow.  I would keep him on the Miralax.          LOS: 12 days    Skyanne Welle 02/11/2019 [phone]

## 2019-02-12 LAB — CBC
HCT: 36.4 % — ABNORMAL LOW (ref 39.0–52.0)
Hemoglobin: 12.2 g/dL — ABNORMAL LOW (ref 13.0–17.0)
MCH: 32.3 pg (ref 26.0–34.0)
MCHC: 33.5 g/dL (ref 30.0–36.0)
MCV: 96.3 fL (ref 80.0–100.0)
Platelets: 325 10*3/uL (ref 150–400)
RBC: 3.78 MIL/uL — ABNORMAL LOW (ref 4.22–5.81)
RDW: 13 % (ref 11.5–15.5)
WBC: 8.1 10*3/uL (ref 4.0–10.5)
nRBC: 0 % (ref 0.0–0.2)

## 2019-02-12 LAB — COMPREHENSIVE METABOLIC PANEL
ALT: 59 U/L — ABNORMAL HIGH (ref 0–44)
AST: 35 U/L (ref 15–41)
Albumin: 2.6 g/dL — ABNORMAL LOW (ref 3.5–5.0)
Alkaline Phosphatase: 170 U/L — ABNORMAL HIGH (ref 38–126)
Anion gap: 10 (ref 5–15)
BUN: 15 mg/dL (ref 6–20)
CO2: 23 mmol/L (ref 22–32)
Calcium: 9.6 mg/dL (ref 8.9–10.3)
Chloride: 100 mmol/L (ref 98–111)
Creatinine, Ser: 1.02 mg/dL (ref 0.61–1.24)
GFR calc Af Amer: >60 mL/min (ref >60)
GFR calc non Af Amer: >60 mL/min (ref >60)
Glucose, Bld: 105 mg/dL — ABNORMAL HIGH (ref 70–99)
Potassium: 4.6 mmol/L (ref 3.5–5.1)
Sodium: 133 mmol/L — ABNORMAL LOW (ref 135–145)
Total Bilirubin: 1.2 mg/dL (ref 0.3–1.2)
Total Protein: 6.3 g/dL — ABNORMAL LOW (ref 6.5–8.1)

## 2019-02-12 NOTE — TOC Initial Note (Addendum)
Transition of Care [facility]) - Initial/Assessment Note    Patient Details  Name: Caleb Potter MRN: [id number] Date of Birth: 1961/03/15  Transition of Care [facility]) CM/SW Contact:    Lawerance Sabal, RN Phone Number: 02/12/2019, 3:09 PM  Clinical Narrative:              Consult for [facility] RN. [facility] able to take. [facility], [facility], [facility], [facility], [facility], [facility], all said no or were out of network. [facility] never responded. Confirmed w [facility] that they would be able to do next day start. If DC's Sunday will start Monday.       Expected Discharge Plan: Home w Home Health Services Barriers to Discharge: Continued Medical Work up   Patient Goals and CMS Choice Patient states their goals for this hospitalization and ongoing recovery are:: to go home CMS Medicare.gov Compare Post Acute Care list provided to:: Patient Choice offered to / list presented to : Patient  Expected Discharge Plan and Services Expected Discharge Plan: Home w Home Health Services   Discharge Planning Services: CM Consult Post Acute Care Choice: Home Health Living arrangements for the past 2 months: Single Family Home(2 levels but he stays on ground level)                           HH Arranged: RN HH Agency: [facility] Date HH Agency Contacted: 02/12/19 Time HH Agency Contacted: 1509 Representative spoke with at [facility] Agency: Melissa  Prior Living Arrangements/Services Living arrangements for the past 2 months: Single Family Home(2 levels but he stays on ground level) Lives with:: Significant Other Patient language and need for interpreter reviewed:: Yes Do you feel safe going back to the place where you live?: Yes      Need for Family Participation in Patient Care: Yes (Comment) Care giver support system in place?: Yes (comment)(wife and daughter) Current home services: DME(states he has all he would need) Criminal Activity/Legal Involvement Pertinent to Current  Situation/Hospitalization: No - Comment as needed  Activities of Daily Living Home Assistive Devices/Equipment: None ADL Screening (condition at time of admission) Patient's cognitive ability adequate to safely complete daily activities?: Yes Is the patient deaf or have difficulty hearing?: No Does the patient have difficulty seeing, even when wearing glasses/contacts?: No Does the patient have difficulty concentrating, remembering, or making decisions?: No Patient able to express need for assistance with ADLs?: Yes Does the patient have difficulty dressing or bathing?: No Independently performs ADLs?: No Communication: Independent Dressing (OT): Needs assistance Is this a change from baseline?: Change from baseline, expected to last <3days Grooming: Needs assistance Is this a change from baseline?: Change from baseline, expected to last <3 days Feeding: Independent Bathing: Needs assistance Is this a change from baseline?: Change from baseline, expected to last <3 days Toileting: Needs assistance Is this a change from baseline?: Change from baseline, expected to last <3 days In/Out Bed: Needs assistance Is this a change from baseline?: Change from baseline, expected to last <3 days Walks in Home: Needs assistance Is this a change from baseline?: Change from baseline, expected to last <3 days Does the patient have difficulty walking or climbing stairs?: No Weakness of Legs: None Weakness of Arms/Hands: None  Permission Sought/Granted Permission sought to share information with : Family Supports Permission granted to share information with : Yes, Verbal Permission Granted  Share Information with NAME: Boyd Kerbs or Marcelino Duster     Permission granted to share info w  Relationship: wife or daughter  Permission granted to share info w Contact Information: [phone] or [phone]  Emotional Assessment Appearance:: Appears stated age Attitude/Demeanor/Rapport: Engaged Affect (typically  observed): Accepting, Appropriate, Pleasant Orientation: : Oriented to Self, Oriented to Place, Oriented to  Time, Oriented to Situation   Psych Involvement: No (comment)  Admission diagnosis:  Bladder rupture [N32.89] Intraperitoneal hemorrhage [K66.1] Acute renal failure, unspecified acute renal failure type (HCC) [N17.9] Intraperitoneal rupture of bladder [N32.89] Patient Active Problem List   Diagnosis Date Noted  . Hypercalcemia 02/10/2019  . Bladder rupture   . Ileus, postoperative (HCC)   . 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 Hypertension 01/31/2019  . Coronary artery disease 01/31/2019  . AKI (acute kidney injury) (HCC) 01/31/2019  . Hyperkalemia 01/31/2019  . Anxiety 01/31/2019  . Intraperitoneal hematoma 01/30/2019   PCP:  Richardean Chimeraaniel, Terry G, MD Pharmacy:   [facility] [address] - [place], [place] - [address] [place] [address] [place] [address] [place] [address] Phone: [phone] Fax: [phone]  [facility] [phone]#[id number] - [place],  - [address] [facility] [address]  [place] [id number] Phone: [phone] Fax: [phone]     Social Determinants of Health (SDOH) Interventions    Readmission Risk Interventions No flowsheet data found.

## 2019-02-12 NOTE — Progress Notes (Signed)
PROGRESS NOTE    Caleb Potter  ION:[id number] DOB: [birth year] DOA: 01/30/2019 PCP: [name], MD      Brief Narrative:  Caleb Potter is a 58 y.o.malewith a history of HTN, CAD, and nephrolithiasis who presented to the [facility] ED with abdominal pain found to have intraperitoneal fluid, suspected hemorrhage on CT abd/pelvis with subsequent CT pyelogram demonstrating ruptured bladder and hyperkalemic AKI with creatinine 7.7, K 6.1. Taken to OR for ex lap with bladder repair 4/13.       Assessment & Plan:  Ruptured bladder -Per primary team  Ileus Appears to be resolved. He has a fully eaten plate of regular diet in front of him. -Per General Surgery  Medical issues: Hypercalcemia Resolved, no follow up needed  AKI Resolved, this was a post-obstructive nephropathy, now resovled. No follow up needed.  Elevated LFTs This is mild and now mostly resolved with some mild ALT elevation.  Suspect NASH -Repeat LFTs with PCP in 1 month  Imapried fasting glucose Routine diabetes screening by PCP  Hypertension BP soft -Continue metoprolol, hold lisinorpil at discahrge  DISCHARGE MED REC:  -Continue Trelegy, finasteride, Flomax, allopurinol, baby aspirin, metoprolol, simvastatin -Reasonable to resume Valium and Celebrex, but continued use should pend discussion with PCP -Stop lisinopril until evaluated by PCP     MDM and disposition: The below labs and imaging reports were reviewed and summarized above.  Medication management as above.  The patient was admitted with ruptured bladder.  Our service was consulted for assistance with medical managmeent of Hypercalcemia, AKI, transaminitis, COPD, CAD, and Hypertension.   He is now stable for discharge from the medical standpoint, defer discharge planning from post-operative standpoint to Urology.             Subjective: Feeling well.  Left wrist a little sorre from IV site, no redness or swelling.  No leg swelling,  dyspnea, cough, confusion, fever, vomiting, diarrhea.    Objective: Vitals:   02/11/19 2341 02/12/19 0350 02/12/19 0740 02/12/19 0803  BP:   Pulse: 90 86 87   Resp: Temp: 99.2 F (37.3 C) 98.8 F (37.1 C) 98.6 F (37 C)   TempSrc: Oral Oral Oral   SpO2: 96% 94% 93% 95%  Weight:      Height:        Intake/Output Summary (Last 24 hours) at 02/12/2019 1015 Last data filed at 02/12/2019 2440 Gross per 24 hour  Intake 1427.5 ml  Output 2400 ml  Net -972.5 ml   Filed Weights   02/07/19 1642 02/08/19 0429 02/09/19 0348  Weight: 86.2 kg 94.8 kg 98.9 kg    Examination: General appearance:  adult male, alert and in no acute distress.   HEENT: Anicteric, conjunctiva pink, lids and lashes normal. No nasal deformity, discharge, epistaxis.  Lips moist.   Skin: Warm and dry.  no jaundice.  No suspicious rashes or lesions. Cardiac: RRR, nl S1-S2, no murmurs appreciated.  Capillary refill is brisk.  JVP not visible.  No LE edema.  Radia  pulses 2+ and symmetric. Respiratory: Normal respiratory rate and rhythm.  CTAB without rales or wheezes. Abdomen: Abdomen soft.  No TTP. No ascites although his belly is round.    MSK: No deformities or effusions. Neuro: Awake and alert.  EOMI, moves all extremities. Speech fluent.    Psych: Sensorium intact and responding to questions, attention normal. Affect normal.  Judgment and insight appear normal.    Data Reviewed: I  have personally reviewed following labs and imaging studies:  CBC: Recent Labs  Lab 02/05/19 1206 02/06/19 0440 02/08/19 0509 02/11/19 0500 02/12/19 0344  WBC 8.1 9.6 8.7 9.4 8.1  NEUTROABS  --   --  5.2  --   --   HGB 12.2* 12.5* 11.8* 11.5* 12.2*  HCT 37.2* 38.9* 36.5* 35.3* 36.4*  MCV 97.4 98.7 97.6 97.5 96.3  PLT 222 262 275 337 325   Basic Metabolic Panel: Recent Labs  Lab 02/08/19 0509 02/09/19 0746 02/10/19 0500 02/11/19 0500 02/12/19 0344  NA 134* 132* 134* 133* 133*  K 4.3  4.4 4.5 4.6 4.6  CL 98 99 100 100 100  CO2 26 24 27 24 23   GLUCOSE 122* 133* 137* 131* 105*  BUN 19 16 17 16 15   CREATININE 1.09 0.89 0.89 1.00 1.02  CALCIUM 9.1 9.2 9.4 9.3 9.6  MG 1.9 1.9 2.0  --   --   PHOS 4.1 3.5 3.6  --   --    GFR: Estimated Creatinine Clearance: 85.8 mL/min (by C-G formula based on SCr of 1.02 mg/dL). Liver Function Tests: Recent Labs  Lab 02/08/19 0509 02/09/19 0746 02/10/19 0500 02/11/19 0500 02/12/19 0344  AST 87* 52* 43* 39 35  ALT 103* 81* 76* 66* 59*  ALKPHOS 153* 134* 153* 168* 170*  BILITOT 3.0* 1.6* 1.2 1.1 1.2  PROT 6.2* 5.9* 6.2* 6.0* 6.3*  ALBUMIN 2.5* 2.4* 2.5* 2.5* 2.6*   No results for input(s): LIPASE, AMYLASE in the last 168 hours. No results for input(s): AMMONIA in the last 168 hours. Coagulation Profile: No results for input(s): INR, PROTIME in the last 168 hours. Cardiac Enzymes: No results for input(s): CKTOTAL, CKMB, CKMBINDEX, TROPONINI in the last 168 hours. BNP (last 3 results) No results for input(s): PROBNP in the last 8760 hours. HbA1C: Recent Labs    02/11/19 0500  HGBA1C 5.7*   CBG: Recent Labs  Lab 02/10/19 1929 02/11/19 0000 02/11/19 0439 02/11/19 0820 02/11/19 1154  GLUCAP 97 108* 121* 124* 146*   Lipid Profile: No results for input(s): CHOL, HDL, LDLCALC, TRIG, CHOLHDL, LDLDIRECT in the last 72 hours. Thyroid Function Tests: No results for input(s): TSH, T4TOTAL, FREET4, T3FREE, THYROIDAB in the last 72 hours. Anemia Panel: No results for input(s): VITAMINB12, FOLATE, FERRITIN, TIBC, IRON, RETICCTPCT in the last 72 hours. Urine analysis:    Component Value Date/Time   COLORURINE YELLOW 01/31/2019 0312   APPEARANCEUR CLEAR 01/31/2019 0312   LABSPEC 1.015 01/31/2019 0312   PHURINE 5.0 01/31/2019 0312   GLUCOSEU 50 (A) 01/31/2019 0312   HGBUR LARGE (A) 01/31/2019 0312   BILIRUBINUR NEGATIVE 01/31/2019 0312   KETONESUR NEGATIVE 01/31/2019 0312   PROTEINUR NEGATIVE 01/31/2019 0312   NITRITE  NEGATIVE 01/31/2019 0312   LEUKOCYTESUR NEGATIVE 01/31/2019 0312   Sepsis Labs: @LABRCNTIP (procalcitonin:4,lacticacidven:4)  )No results found for this or any previous visit (from the past 240 hour(s)).       Radiology Studies: No results found.      Scheduled Meds: . aspirin  81 mg Oral Daily  . feeding supplement  1 Container Oral TID BM  . finasteride  5 mg Oral Daily  . fluticasone furoate-vilanterol  1 puff Inhalation Daily  . folic acid  1 mg Oral Daily  . heparin injection (subcutaneous)  5,000 Units Subcutaneous Q8H  . metoCLOPramide (REGLAN) injection  10 mg Intravenous Q6H  . metoprolol succinate  50 mg Oral Daily  . multivitamin with minerals  1 tablet Oral Daily  .  simvastatin  40 mg Oral q1800  . sodium chloride flush  10-40 mL Intracatheter Q12H  . thiamine  100 mg Oral Daily  . umeclidinium bromide  1 puff Inhalation Daily   Continuous Infusions:   LOS: 13 days    Time spent: 25 minutes    Alberteen Sam, MD [facility] 02/12/2019, 10:15 AM     Please page through AMION:  [email or web address] Password [email or web address] If 7PM-7AM, please contact night-coverage

## 2019-02-12 NOTE — Progress Notes (Signed)
Progress Note: General Surgery Service   Assessment/Plan: Principal Problem:   Intraperitoneal rupture of bladder Active Problems:   Intraperitoneal hematoma   Hypertension   Coronary artery disease   AKI (acute kidney injury) (HCC)   Hyperkalemia   Anxiety   Bladder rupture   Ileus, postoperative (HCC)   Hypercalcemia  s/p Procedure(s): EXPLORATORY LAPAROTOMY Bladder Repair 01/30/2019  Clot retention with intraperitoneal bladder rupture and AKI Bladder rupture Exploratory laparotomy with repair of bladder rupture.22Fr foley. NG, JP, Dr. Annabell Howells, 01/31/19 POD#12  Postop ileus - AXR 04/20showed gaseous distention of sm bowel and colon - NGT, IVF until return of bowel function >> NG out 4/24 after + BM -advance to reg diet -stop TPN - encourage ambulation  Moderate Malnutrition - Prealbumin 11.4  Elevated LFT's - stable  Hx of chronic constipation:   On Miralax at home.    FEN: IV fluids/reg diet ID: Rocephin 4/13-4/16/20 DVT: Heparin Follow up: TBD  Plan:  I would keep him on the Miralax.  If tolerates diet, can be discharged from gen surgery standpoint and will signoff   LOS: 13 days  Chief Complaint/Subjective: Tolerating liquids, additional BM yesterday, no abdominal pain or nausea  Objective: Vital signs in last 24 hours: Temp:  [98.4 F (36.9 C)-99.2 F (37.3 C)] 98.6 F (37 C) (04/25 0740) Pulse Rate:  [86-109] 87 (04/25 0740) Resp:  [16-20] 16 (04/25 0740) BP: (93-121)/(66-82) 97/68 (04/25 0740) SpO2:  [93 %-98 %] 95 % (04/25 0803) Last BM Date: 02/11/19  Intake/Output from previous day: 04/24 0701 - 04/25 0700 In: 2287.9 [P.O.:1200; I.V.:1087.9] Out: 2800 [Urine:2800] Intake/Output this shift: No intake/output data recorded.  Lungs: nonlabored breathing  Cardiovascular: RRR  Abd: soft, NT, ND, wound dressing dry  Extremities: no edema  Neuro: AOx4  Lab Results: CBC  Recent Labs    02/11/19 0500 02/12/19 0344  WBC  9.4 8.1  HGB 11.5* 12.2*  HCT 35.3* 36.4*  PLT 337 325   BMET Recent Labs    02/11/19 0500 02/12/19 0344  NA 133* 133*  K 4.6 4.6  CL 100 100  CO2 24 23  GLUCOSE 131* 105*  BUN 16 15  CREATININE 1.00 1.02  CALCIUM 9.3 9.6   PT/INR No results for input(s): LABPROT, INR in the last 72 hours. ABG No results for input(s): PHART, HCO3 in the last 72 hours.  Invalid input(s): PCO2, PO2  Studies/Results:  Anti-infectives: Anti-infectives (From admission, onward)   Start     Dose/Rate Route Frequency Ordered Stop   02/01/19 0100  cefTRIAXone (ROCEPHIN) 2 g in sodium chloride 0.9 % 100 mL IVPB  Status:  Discontinued     2 g 200 mL/hr over 30 Minutes Intravenous Every 24 hours 01/31/19 0223 02/03/19 0855   01/31/19 0030  cefTRIAXone (ROCEPHIN) 2 g in sodium chloride 0.9 % 100 mL IVPB     2 g 200 mL/hr over 30 Minutes Intravenous  Once 01/31/19 0015 01/31/19 0240      Medications: Scheduled Meds: . aspirin  81 mg Oral Daily  . feeding supplement  1 Container Oral TID BM  . finasteride  5 mg Oral Daily  . fluticasone furoate-vilanterol  1 puff Inhalation Daily  . folic acid  1 mg Oral Daily  . heparin injection (subcutaneous)  5,000 Units Subcutaneous Q8H  . metoCLOPramide (REGLAN) injection  10 mg Intravenous Q6H  . metoprolol succinate  50 mg Oral Daily  . multivitamin with minerals  1 tablet Oral Daily  . simvastatin  40  mg Oral q1800  . sodium chloride flush  10-40 mL Intracatheter Q12H  . thiamine  100 mg Oral Daily  . umeclidinium bromide  1 puff Inhalation Daily   Continuous Infusions: PRN Meds:.acetaminophen **OR** acetaminophen, diphenhydrAMINE **OR** diphenhydrAMINE, HYDROcodone-acetaminophen, HYDROmorphone (DILAUDID) injection, LORazepam, menthol-cetylpyridinium, morphine injection, ondansetron **OR** ondansetron (ZOFRAN) IV, oxyCODONE, senna-docusate, sodium chloride flush, sodium phosphate, zolpidem  Caleb PickleLuke Aaron Milayah Krell, MD [facility]

## 2019-02-12 NOTE — Progress Notes (Signed)
Patient ID: Caleb Potter, male   DOB: 1961/09/03, 58 y.o.   MRN: [id number]  13 Days Post-Op Subjective: Pt off TPN and tolerating diet since NG removed yesterday. Slight temp yesterday. None overnight.  Having bowel movements.  Objective: Vital signs in last 24 hours: Temp:  [98.5 F (36.9 C)-99.2 F (37.3 C)] 98.6 F (37 C) (04/25 0740) Pulse Rate:  [86-109] 87 (04/25 0740) Resp:  [16-20] 16 (04/25 0740) BP: (93-107)/(66-77) 97/68 (04/25 0740) SpO2:  [93 %-98 %] 95 % (04/25 0803)  Intake/Output from previous day: 04/24 0701 - 04/25 0700 In: 2287.9 [P.O.:1200; I.V.:1087.9] Out: 2800 [Urine:2800] Intake/Output this shift: Total I/O In: -  Out: 200 [Urine:200]  Physical Exam:  General: Alert and oriented Abdomen: Soft, ND, obese Incisions: Wound granulating, packed with wet to dry dressing (last changed about 18 hours ago) Ext: NT, No erythema GU: Urine clear in Foley  Lab Results: Recent Labs    02/11/19 0500 02/12/19 0344  HGB 11.5* 12.2*  HCT 35.3* 36.4*   BMET Recent Labs    02/11/19 0500 02/12/19 0344  NA 133* 133*  K 4.6 4.6  CL 100 100  CO2 24 23  GLUCOSE 131* 105*  BUN 16 15  CREATININE 1.00 1.02  CALCIUM 9.3 9.6     Studies/Results: No results found.  Assessment/Plan: POD # 13 s/p bladder rupture and repair - Ileus resolving.  Tolerating regular diet thus far. - Wound healing appropriately.  Continue wet to dry dressings. - Discharge planning: Stable for discharge per General Surgery and Internal Medicine.  Will see if we can arrange home health or confirm ability of wife to perform wet to dry dressing changes. (I spoke with wife today and she feels that she would be able to do this with teaching from home health nurse since we cannot do in hospital teaching).   LOS: 13 days   Crecencio Mc 02/12/2019, 11:53 AM

## 2019-02-13 NOTE — Discharge Summary (Signed)
Date of admission: 01/30/2019  Date of discharge: 02/13/2019  Admission diagnosis: Intraperitoneal bladder rupture  Discharge diagnosis: Intraperitoneal bladder rupture  Secondary diagnoses: Hypertension, diabetes, COPD  History and Physical: For full details, please see admission history and physical. Briefly, Caleb Potter is a 58 y.o. year old patient who presented with an intraperitoneal bladder rupture.  Hospital Course: He presented on 4/12 and underwent exploratory laparotomy and repair of his bladder.  He developed a postoperative ileus requiring NG tube placement which resolved eventually with conservative therapy.  He also developed superficial wound separation managed with wet to dry dressing changes.  Internal medicine was consulted to address his acute on chronic medical issues.  By 02/13/19, his ileus was resolved and his medical issues were all stabilized and home health was arranged and he was able to be discharged home with his catheter.  Laboratory values:  Recent Labs    02/11/19 0500 02/12/19 0344  HGB 11.5* 12.2*  HCT 35.3* 36.4*   Recent Labs    02/11/19 0500 02/12/19 0344  CREATININE 1.00 1.02    Disposition: Home  Discharge instruction: The patient was instructed to be ambulatory but told to refrain from heavy lifting, strenuous activity, or driving.   Discharge medications:  Allergies as of 02/13/2019      Reactions   Citalopram Other (See Comments)   Grind teeth. "high power cocaine"       Medication List    STOP taking these medications   senna-docusate 8.6-50 MG tablet Commonly known as:  Senokot-S   sulfamethoxazole-trimethoprim 800-160 MG tablet Commonly known as:  BACTRIM DS     TAKE these medications   allopurinol 300 MG tablet Commonly known as:  ZYLOPRIM Take 300 mg by mouth daily.   aspirin EC 81 MG tablet Take 81 mg by mouth daily.   celecoxib 200 MG capsule Commonly known as:  CELEBREX Take 200 mg by mouth daily.    cholecalciferol 1000 units tablet Commonly known as:  VITAMIN D Take 1,000 Units by mouth daily.   diazepam 10 MG tablet Commonly known as:  VALIUM Take 10 mg by mouth 3 (three) times daily as needed for anxiety.   DULoxetine 60 MG capsule Commonly known as:  CYMBALTA Take 60 mg by mouth daily. For pain and depression   finasteride 5 MG tablet Commonly known as:  PROSCAR Take 1 tablet (5 mg total) by mouth daily.   Fish Oil 1200 MG Caps Take 1,200 mg by mouth daily.   HYDROcodone-acetaminophen 5-325 MG tablet Commonly known as:  NORCO/VICODIN Take 1 tablet by mouth every 6 (six) hours as needed for moderate pain.   lisinopril 20 MG tablet Commonly known as:  ZESTRIL Take 20 mg by mouth daily. For high blood pressure   metoprolol succinate 50 MG 24 hr tablet Commonly known as:  TOPROL-XL Take 50 mg by mouth daily. Take with or immediately following a meal.   omeprazole 20 MG capsule Commonly known as:  PRILOSEC Take 20 mg by mouth daily. For acid reflux   polyethylene glycol 17 g packet Commonly known as:  MiraLax Take 17 g by mouth daily.   simvastatin 40 MG tablet Commonly known as:  ZOCOR Take 40 mg by mouth daily. For high cholesterol   tamsulosin 0.4 MG Caps capsule Commonly known as:  FLOMAX Take 2 capsules (0.8 mg total) by mouth daily.   Trelegy Ellipta 100-62.5-25 MCG/INH Aepb Generic drug:  Fluticasone-Umeclidin-Vilant Inhale 1 puff into the lungs daily. For COPD  vitamin C 1000 MG tablet Take 1,000 mg by mouth daily.       Followup:  Follow-up Information    Bjorn Pippin, MD. Call in 1 day(s).   Specialty:  Urology Why:  Call on Monday to schedule follow up appointment. Contact information: [address] [place] [address] [phone]        [facility] Follow up.   Specialty:  Home Health Services Why:  They will contact you in 1-2 days to set up your home schedule

## 2019-02-13 NOTE — TOC Transition Note (Signed)
Transition of Care [facility]) - CM/SW Discharge Note   Patient Details  Name: Caleb Potter MRN: [id number] Date of Birth: [birth year]  [facility]) CM/SW Contact:  Lawerance Sabal, RN Phone Number: 02/13/2019, 9:12 AM   Clinical Narrative:    Spoke w wife to encourage her to come to hospital for wound care teaching, she deferred. She will be sending her daughter to provide transport home. She stated that she "could do it." Spoke w Melissa at [facility] who will schedule [facility] RN to be at their house tomorrow morning for dressing care and to teach wife.     Final next level of care: Home w Home Health Services Barriers to Discharge: No Barriers Identified   Patient Goals and CMS Choice Patient states their goals for this hospitalization and ongoing recovery are:: to go home CMS Medicare.gov Compare Post Acute Care list provided to:: Patient Choice offered to / list presented to : Patient  Discharge Placement                       Discharge Plan and Services   Discharge Planning Services: CM Consult Post Acute Care Choice: Home Health                    HH Arranged: RN [facility] Agency: [facility] Date [facility] Agency Contacted: 02/12/19 Time HH Agency Contacted: 1509 Representative spoke with at [facility] Agency: Melissa  Social Determinants of Health (SDOH) Interventions     Readmission Risk Interventions No flowsheet data found.

## 2019-02-13 NOTE — Progress Notes (Signed)
Pt given discharge summary and discharged home with urinary catheter via daughter as transportation.

## 2019-02-13 NOTE — Progress Notes (Signed)
Patient ID: Caleb Potter, male   DOB: 28-Aug-1961, 58 y.o.   MRN: [id number]  14 Days Post-Op Subjective: Pt continues to do well since NG removed. Tolerating regular diet last 24 hrs and having BMs.  Denies nausea or vomiting.  I spoke with case management yesterday and home health will begin tomorrow.  Per case management, it will be ok for his wife to get teaching on dressing changes in the hospital today prior to discharge.  Objective: Vital signs in last 24 hours: Temp:  [98 F (36.7 C)-98.7 F (37.1 C)] 98 F (36.7 C) (04/26 0302) Pulse Rate:  [91-105] 95 (04/26 0302) Resp:  [16-18] 18 (04/26 0302) BP: (96-106)/(66-76) 103/66 (04/26 0302) SpO2:  [94 %-96 %] 96 % (04/26 0302)  Intake/Output from previous day: 04/25 0701 - 04/26 0700 In: -  Out: 1400 [Urine:1400] Intake/Output this shift: No intake/output data recorded.  Physical Exam:  General: Alert and oriented Abdomen: Soft, ND Incisions: Clean, wound granulating well, no erythema or drainage. Ext: NT, No erythema  Lab Results: Recent Labs    02/11/19 0500 02/12/19 0344  HGB 11.5* 12.2*  HCT 35.3* 36.4*   BMET Recent Labs    02/11/19 0500 02/12/19 0344  NA 133* 133*  K 4.6 4.6  CL 100 100  CO2 24 23  GLUCOSE 131* 105*  BUN 16 15  CREATININE 1.00 1.02  CALCIUM 9.3 9.6     Studies/Results: No results found.  Assessment/Plan: Bladder rupture s/p repair 2 weeks ago - Ileus resolved - HH arranged to assist with dressing changes - Ok for discharge today.  Will have nursing staff provide teaching for catheter and leg bag and dressing changes for patient and his wife - Will have Dr. Annabell Potter arrange follow up for likely cystogram and voiding trial as outpatient   LOS: 14 days   Caleb Potter 02/13/2019, 7:50 AM

## 2019-02-17 DIAGNOSIS — Z6830 Body mass index (BMI) 30.0-30.9, adult: Secondary | ICD-10-CM | POA: Diagnosis not present

## 2019-02-17 DIAGNOSIS — N3289 Other specified disorders of bladder: Secondary | ICD-10-CM | POA: Diagnosis not present

## 2019-02-17 DIAGNOSIS — R319 Hematuria, unspecified: Secondary | ICD-10-CM | POA: Diagnosis not present

## 2019-02-18 DIAGNOSIS — R338 Other retention of urine: Secondary | ICD-10-CM | POA: Diagnosis not present

## 2019-02-18 DIAGNOSIS — S3729XA Other injury of bladder, initial encounter: Secondary | ICD-10-CM | POA: Diagnosis not present

## 2019-02-18 LAB — GLUCOSE, CAPILLARY
Glucose-Capillary: 128 mg/dL — ABNORMAL HIGH (ref 70–99)
Glucose-Capillary: 128 mg/dL — ABNORMAL HIGH (ref 70–99)

## 2019-02-19 DIAGNOSIS — Z7951 Long term (current) use of inhaled steroids: Secondary | ICD-10-CM | POA: Diagnosis not present

## 2019-02-19 DIAGNOSIS — F17210 Nicotine dependence, cigarettes, uncomplicated: Secondary | ICD-10-CM | POA: Diagnosis not present

## 2019-02-19 DIAGNOSIS — Z5181 Encounter for therapeutic drug level monitoring: Secondary | ICD-10-CM | POA: Diagnosis not present

## 2019-02-19 DIAGNOSIS — E119 Type 2 diabetes mellitus without complications: Secondary | ICD-10-CM | POA: Diagnosis not present

## 2019-02-19 DIAGNOSIS — I2510 Atherosclerotic heart disease of native coronary artery without angina pectoris: Secondary | ICD-10-CM | POA: Diagnosis not present

## 2019-02-19 DIAGNOSIS — I10 Essential (primary) hypertension: Secondary | ICD-10-CM | POA: Diagnosis not present

## 2019-02-19 DIAGNOSIS — Z960 Presence of urogenital implants: Secondary | ICD-10-CM | POA: Diagnosis not present

## 2019-02-19 DIAGNOSIS — Z466 Encounter for fitting and adjustment of urinary device: Secondary | ICD-10-CM | POA: Diagnosis not present

## 2019-02-19 DIAGNOSIS — Z791 Long term (current) use of non-steroidal anti-inflammatories (NSAID): Secondary | ICD-10-CM | POA: Diagnosis not present

## 2019-02-19 DIAGNOSIS — Z48815 Encounter for surgical aftercare following surgery on the digestive system: Secondary | ICD-10-CM | POA: Diagnosis not present

## 2019-02-19 DIAGNOSIS — I252 Old myocardial infarction: Secondary | ICD-10-CM | POA: Diagnosis not present

## 2019-02-19 DIAGNOSIS — J449 Chronic obstructive pulmonary disease, unspecified: Secondary | ICD-10-CM | POA: Diagnosis not present

## 2019-02-21 DIAGNOSIS — R3912 Poor urinary stream: Secondary | ICD-10-CM | POA: Diagnosis not present

## 2019-02-21 DIAGNOSIS — N401 Enlarged prostate with lower urinary tract symptoms: Secondary | ICD-10-CM | POA: Diagnosis not present

## 2019-02-22 DIAGNOSIS — Z466 Encounter for fitting and adjustment of urinary device: Secondary | ICD-10-CM | POA: Diagnosis not present

## 2019-02-22 DIAGNOSIS — I2510 Atherosclerotic heart disease of native coronary artery without angina pectoris: Secondary | ICD-10-CM | POA: Diagnosis not present

## 2019-02-22 DIAGNOSIS — I10 Essential (primary) hypertension: Secondary | ICD-10-CM | POA: Diagnosis not present

## 2019-02-22 DIAGNOSIS — Z960 Presence of urogenital implants: Secondary | ICD-10-CM | POA: Diagnosis not present

## 2019-02-22 DIAGNOSIS — Z791 Long term (current) use of non-steroidal anti-inflammatories (NSAID): Secondary | ICD-10-CM | POA: Diagnosis not present

## 2019-02-22 DIAGNOSIS — Z48815 Encounter for surgical aftercare following surgery on the digestive system: Secondary | ICD-10-CM | POA: Diagnosis not present

## 2019-02-22 DIAGNOSIS — E119 Type 2 diabetes mellitus without complications: Secondary | ICD-10-CM | POA: Diagnosis not present

## 2019-02-22 DIAGNOSIS — Z7951 Long term (current) use of inhaled steroids: Secondary | ICD-10-CM | POA: Diagnosis not present

## 2019-02-22 DIAGNOSIS — J449 Chronic obstructive pulmonary disease, unspecified: Secondary | ICD-10-CM | POA: Diagnosis not present

## 2019-02-22 DIAGNOSIS — I252 Old myocardial infarction: Secondary | ICD-10-CM | POA: Diagnosis not present

## 2019-02-22 DIAGNOSIS — Z5181 Encounter for therapeutic drug level monitoring: Secondary | ICD-10-CM | POA: Diagnosis not present

## 2019-02-22 DIAGNOSIS — F17210 Nicotine dependence, cigarettes, uncomplicated: Secondary | ICD-10-CM | POA: Diagnosis not present

## 2019-02-25 DIAGNOSIS — Z5181 Encounter for therapeutic drug level monitoring: Secondary | ICD-10-CM | POA: Diagnosis not present

## 2019-02-25 DIAGNOSIS — I252 Old myocardial infarction: Secondary | ICD-10-CM | POA: Diagnosis not present

## 2019-02-25 DIAGNOSIS — J449 Chronic obstructive pulmonary disease, unspecified: Secondary | ICD-10-CM | POA: Diagnosis not present

## 2019-02-25 DIAGNOSIS — Z791 Long term (current) use of non-steroidal anti-inflammatories (NSAID): Secondary | ICD-10-CM | POA: Diagnosis not present

## 2019-02-25 DIAGNOSIS — E119 Type 2 diabetes mellitus without complications: Secondary | ICD-10-CM | POA: Diagnosis not present

## 2019-02-25 DIAGNOSIS — Z466 Encounter for fitting and adjustment of urinary device: Secondary | ICD-10-CM | POA: Diagnosis not present

## 2019-02-25 DIAGNOSIS — I2510 Atherosclerotic heart disease of native coronary artery without angina pectoris: Secondary | ICD-10-CM | POA: Diagnosis not present

## 2019-02-25 DIAGNOSIS — I10 Essential (primary) hypertension: Secondary | ICD-10-CM | POA: Diagnosis not present

## 2019-02-25 DIAGNOSIS — F17210 Nicotine dependence, cigarettes, uncomplicated: Secondary | ICD-10-CM | POA: Diagnosis not present

## 2019-02-25 DIAGNOSIS — Z960 Presence of urogenital implants: Secondary | ICD-10-CM | POA: Diagnosis not present

## 2019-02-25 DIAGNOSIS — Z7951 Long term (current) use of inhaled steroids: Secondary | ICD-10-CM | POA: Diagnosis not present

## 2019-02-25 DIAGNOSIS — Z48815 Encounter for surgical aftercare following surgery on the digestive system: Secondary | ICD-10-CM | POA: Diagnosis not present

## 2019-03-02 DIAGNOSIS — R338 Other retention of urine: Secondary | ICD-10-CM | POA: Diagnosis not present

## 2019-03-02 DIAGNOSIS — N401 Enlarged prostate with lower urinary tract symptoms: Secondary | ICD-10-CM | POA: Diagnosis not present

## 2019-03-04 DIAGNOSIS — I2510 Atherosclerotic heart disease of native coronary artery without angina pectoris: Secondary | ICD-10-CM | POA: Diagnosis not present

## 2019-03-04 DIAGNOSIS — F17210 Nicotine dependence, cigarettes, uncomplicated: Secondary | ICD-10-CM | POA: Diagnosis not present

## 2019-03-04 DIAGNOSIS — Z7951 Long term (current) use of inhaled steroids: Secondary | ICD-10-CM | POA: Diagnosis not present

## 2019-03-04 DIAGNOSIS — Z960 Presence of urogenital implants: Secondary | ICD-10-CM | POA: Diagnosis not present

## 2019-03-04 DIAGNOSIS — J449 Chronic obstructive pulmonary disease, unspecified: Secondary | ICD-10-CM | POA: Diagnosis not present

## 2019-03-04 DIAGNOSIS — Z48815 Encounter for surgical aftercare following surgery on the digestive system: Secondary | ICD-10-CM | POA: Diagnosis not present

## 2019-03-04 DIAGNOSIS — I252 Old myocardial infarction: Secondary | ICD-10-CM | POA: Diagnosis not present

## 2019-03-04 DIAGNOSIS — I10 Essential (primary) hypertension: Secondary | ICD-10-CM | POA: Diagnosis not present

## 2019-03-04 DIAGNOSIS — Z791 Long term (current) use of non-steroidal anti-inflammatories (NSAID): Secondary | ICD-10-CM | POA: Diagnosis not present

## 2019-03-04 DIAGNOSIS — E119 Type 2 diabetes mellitus without complications: Secondary | ICD-10-CM | POA: Diagnosis not present

## 2019-03-04 DIAGNOSIS — Z466 Encounter for fitting and adjustment of urinary device: Secondary | ICD-10-CM | POA: Diagnosis not present

## 2019-03-04 DIAGNOSIS — Z5181 Encounter for therapeutic drug level monitoring: Secondary | ICD-10-CM | POA: Diagnosis not present

## 2019-03-28 DIAGNOSIS — Z6833 Body mass index (BMI) 33.0-33.9, adult: Secondary | ICD-10-CM | POA: Diagnosis not present

## 2019-03-28 DIAGNOSIS — M545 Low back pain: Secondary | ICD-10-CM | POA: Diagnosis not present

## 2019-04-29 DIAGNOSIS — E8881 Metabolic syndrome: Secondary | ICD-10-CM | POA: Diagnosis not present

## 2019-04-29 DIAGNOSIS — I10 Essential (primary) hypertension: Secondary | ICD-10-CM | POA: Diagnosis not present

## 2019-04-29 DIAGNOSIS — R7301 Impaired fasting glucose: Secondary | ICD-10-CM | POA: Diagnosis not present

## 2019-04-29 DIAGNOSIS — E782 Mixed hyperlipidemia: Secondary | ICD-10-CM | POA: Diagnosis not present

## 2019-04-29 DIAGNOSIS — R739 Hyperglycemia, unspecified: Secondary | ICD-10-CM | POA: Diagnosis not present

## 2019-05-02 DIAGNOSIS — I2510 Atherosclerotic heart disease of native coronary artery without angina pectoris: Secondary | ICD-10-CM | POA: Diagnosis not present

## 2019-05-02 DIAGNOSIS — I10 Essential (primary) hypertension: Secondary | ICD-10-CM | POA: Diagnosis not present

## 2019-05-02 DIAGNOSIS — F17210 Nicotine dependence, cigarettes, uncomplicated: Secondary | ICD-10-CM | POA: Diagnosis not present

## 2019-05-02 DIAGNOSIS — E8881 Metabolic syndrome: Secondary | ICD-10-CM | POA: Diagnosis not present

## 2019-05-02 DIAGNOSIS — E782 Mixed hyperlipidemia: Secondary | ICD-10-CM | POA: Diagnosis not present

## 2019-06-06 DIAGNOSIS — N401 Enlarged prostate with lower urinary tract symptoms: Secondary | ICD-10-CM | POA: Diagnosis not present

## 2019-06-06 DIAGNOSIS — R3911 Hesitancy of micturition: Secondary | ICD-10-CM | POA: Diagnosis not present

## 2019-06-16 DIAGNOSIS — N401 Enlarged prostate with lower urinary tract symptoms: Secondary | ICD-10-CM | POA: Diagnosis not present

## 2019-06-16 DIAGNOSIS — M2550 Pain in unspecified joint: Secondary | ICD-10-CM | POA: Diagnosis not present

## 2019-06-16 DIAGNOSIS — Z6834 Body mass index (BMI) 34.0-34.9, adult: Secondary | ICD-10-CM | POA: Diagnosis not present

## 2019-06-20 ENCOUNTER — Other Ambulatory Visit: Payer: Self-pay

## 2019-06-20 DIAGNOSIS — Z20822 Contact with and (suspected) exposure to covid-19: Secondary | ICD-10-CM

## 2019-06-20 DIAGNOSIS — R6889 Other general symptoms and signs: Secondary | ICD-10-CM | POA: Diagnosis not present

## 2019-06-22 LAB — NOVEL CORONAVIRUS, NAA: SARS-CoV-2, NAA: NOT DETECTED

## 2019-07-13 DIAGNOSIS — N401 Enlarged prostate with lower urinary tract symptoms: Secondary | ICD-10-CM | POA: Diagnosis not present

## 2019-07-13 DIAGNOSIS — R3912 Poor urinary stream: Secondary | ICD-10-CM | POA: Diagnosis not present

## 2019-07-22 DIAGNOSIS — R3912 Poor urinary stream: Secondary | ICD-10-CM | POA: Diagnosis not present

## 2019-07-22 DIAGNOSIS — R3914 Feeling of incomplete bladder emptying: Secondary | ICD-10-CM | POA: Diagnosis not present

## 2019-07-22 DIAGNOSIS — R3911 Hesitancy of micturition: Secondary | ICD-10-CM | POA: Diagnosis not present

## 2019-07-22 DIAGNOSIS — N401 Enlarged prostate with lower urinary tract symptoms: Secondary | ICD-10-CM | POA: Diagnosis not present

## 2019-08-30 DIAGNOSIS — Z0001 Encounter for general adult medical examination with abnormal findings: Secondary | ICD-10-CM | POA: Diagnosis not present

## 2019-09-02 DIAGNOSIS — I10 Essential (primary) hypertension: Secondary | ICD-10-CM | POA: Diagnosis not present

## 2019-09-02 DIAGNOSIS — I2510 Atherosclerotic heart disease of native coronary artery without angina pectoris: Secondary | ICD-10-CM | POA: Diagnosis not present

## 2019-09-02 DIAGNOSIS — R7301 Impaired fasting glucose: Secondary | ICD-10-CM | POA: Diagnosis not present

## 2019-09-02 DIAGNOSIS — F17210 Nicotine dependence, cigarettes, uncomplicated: Secondary | ICD-10-CM | POA: Diagnosis not present

## 2019-09-02 DIAGNOSIS — Z23 Encounter for immunization: Secondary | ICD-10-CM | POA: Diagnosis not present

## 2019-09-05 DIAGNOSIS — N401 Enlarged prostate with lower urinary tract symptoms: Secondary | ICD-10-CM | POA: Diagnosis not present

## 2019-09-05 DIAGNOSIS — R3911 Hesitancy of micturition: Secondary | ICD-10-CM | POA: Diagnosis not present

## 2019-09-05 DIAGNOSIS — R3914 Feeling of incomplete bladder emptying: Secondary | ICD-10-CM | POA: Diagnosis not present

## 2019-10-11 DIAGNOSIS — R3911 Hesitancy of micturition: Secondary | ICD-10-CM | POA: Diagnosis not present

## 2019-10-11 DIAGNOSIS — N401 Enlarged prostate with lower urinary tract symptoms: Secondary | ICD-10-CM | POA: Diagnosis not present

## 2019-10-11 DIAGNOSIS — R3914 Feeling of incomplete bladder emptying: Secondary | ICD-10-CM | POA: Diagnosis not present

## 2020-01-12 ENCOUNTER — Other Ambulatory Visit: Payer: Self-pay | Admitting: Family Medicine

## 2020-01-12 ENCOUNTER — Other Ambulatory Visit (HOSPITAL_COMMUNITY): Payer: Self-pay | Admitting: Family Medicine

## 2020-01-12 DIAGNOSIS — F17210 Nicotine dependence, cigarettes, uncomplicated: Secondary | ICD-10-CM

## 2020-01-26 ENCOUNTER — Ambulatory Visit (HOSPITAL_COMMUNITY): Admission: RE | Admit: 2020-01-26 | Payer: 59 | Source: Ambulatory Visit

## 2020-01-26 ENCOUNTER — Encounter (HOSPITAL_COMMUNITY): Payer: Self-pay

## 2020-02-12 ENCOUNTER — Emergency Department (HOSPITAL_COMMUNITY): Payer: 59

## 2020-02-12 ENCOUNTER — Encounter (HOSPITAL_COMMUNITY): Payer: Self-pay | Admitting: Emergency Medicine

## 2020-02-12 ENCOUNTER — Inpatient Hospital Stay (HOSPITAL_COMMUNITY)
Admission: EM | Admit: 2020-02-12 | Discharge: 2020-02-14 | DRG: 683 | Disposition: A | Discharge status: Home / Self Care | Payer: 59 | Attending: Family Medicine | Admitting: Family Medicine

## 2020-02-12 ENCOUNTER — Other Ambulatory Visit: Payer: Self-pay

## 2020-02-12 DIAGNOSIS — K529 Noninfective gastroenteritis and colitis, unspecified: Secondary | ICD-10-CM | POA: Diagnosis not present

## 2020-02-12 DIAGNOSIS — J449 Chronic obstructive pulmonary disease, unspecified: Secondary | ICD-10-CM | POA: Diagnosis present

## 2020-02-12 DIAGNOSIS — R55 Syncope and collapse: Secondary | ICD-10-CM | POA: Diagnosis present

## 2020-02-12 DIAGNOSIS — K219 Gastro-esophageal reflux disease without esophagitis: Secondary | ICD-10-CM | POA: Diagnosis present

## 2020-02-12 DIAGNOSIS — E876 Hypokalemia: Secondary | ICD-10-CM | POA: Diagnosis present

## 2020-02-12 DIAGNOSIS — F419 Anxiety disorder, unspecified: Secondary | ICD-10-CM | POA: Diagnosis present

## 2020-02-12 DIAGNOSIS — E785 Hyperlipidemia, unspecified: Secondary | ICD-10-CM | POA: Diagnosis present

## 2020-02-12 DIAGNOSIS — Z7951 Long term (current) use of inhaled steroids: Secondary | ICD-10-CM

## 2020-02-12 DIAGNOSIS — F17210 Nicotine dependence, cigarettes, uncomplicated: Secondary | ICD-10-CM | POA: Diagnosis present

## 2020-02-12 DIAGNOSIS — A020 Salmonella enteritis: Secondary | ICD-10-CM | POA: Diagnosis present

## 2020-02-12 DIAGNOSIS — Z7982 Long term (current) use of aspirin: Secondary | ICD-10-CM

## 2020-02-12 DIAGNOSIS — Z79899 Other long term (current) drug therapy: Secondary | ICD-10-CM | POA: Diagnosis not present

## 2020-02-12 DIAGNOSIS — I10 Essential (primary) hypertension: Secondary | ICD-10-CM | POA: Diagnosis present

## 2020-02-12 DIAGNOSIS — E860 Dehydration: Secondary | ICD-10-CM | POA: Diagnosis present

## 2020-02-12 DIAGNOSIS — E871 Hypo-osmolality and hyponatremia: Secondary | ICD-10-CM | POA: Diagnosis present

## 2020-02-12 DIAGNOSIS — I959 Hypotension, unspecified: Secondary | ICD-10-CM | POA: Diagnosis present

## 2020-02-12 DIAGNOSIS — M109 Gout, unspecified: Secondary | ICD-10-CM | POA: Diagnosis present

## 2020-02-12 DIAGNOSIS — Z20822 Contact with and (suspected) exposure to covid-19: Secondary | ICD-10-CM | POA: Diagnosis present

## 2020-02-12 DIAGNOSIS — N400 Enlarged prostate without lower urinary tract symptoms: Secondary | ICD-10-CM | POA: Diagnosis present

## 2020-02-12 DIAGNOSIS — N179 Acute kidney failure, unspecified: Principal | ICD-10-CM | POA: Diagnosis present

## 2020-02-12 DIAGNOSIS — Z9861 Coronary angioplasty status: Secondary | ICD-10-CM

## 2020-02-12 DIAGNOSIS — I2510 Atherosclerotic heart disease of native coronary artery without angina pectoris: Secondary | ICD-10-CM | POA: Diagnosis present

## 2020-02-12 DIAGNOSIS — F329 Major depressive disorder, single episode, unspecified: Secondary | ICD-10-CM | POA: Diagnosis present

## 2020-02-12 DIAGNOSIS — R000 Tachycardia, unspecified: Secondary | ICD-10-CM

## 2020-02-12 DIAGNOSIS — I252 Old myocardial infarction: Secondary | ICD-10-CM

## 2020-02-12 LAB — COMPREHENSIVE METABOLIC PANEL
ALT: 32 U/L (ref 0–44)
AST: 34 U/L (ref 15–41)
Albumin: 3.6 g/dL (ref 3.5–5.0)
Alkaline Phosphatase: 78 U/L (ref 38–126)
Anion gap: 15 (ref 5–15)
BUN: 45 mg/dL — ABNORMAL HIGH (ref 6–20)
CO2: 22 mmol/L (ref 22–32)
Calcium: 8.5 mg/dL — ABNORMAL LOW (ref 8.9–10.3)
Chloride: 91 mmol/L — ABNORMAL LOW (ref 98–111)
Creatinine, Ser: 4.8 mg/dL — ABNORMAL HIGH (ref 0.61–1.24)
GFR calc Af Amer: 14 mL/min — ABNORMAL LOW (ref >60)
GFR calc non Af Amer: 12 mL/min — ABNORMAL LOW (ref >60)
Glucose, Bld: 128 mg/dL — ABNORMAL HIGH (ref 70–99)
Potassium: 2.8 mmol/L — ABNORMAL LOW (ref 3.5–5.1)
Sodium: 128 mmol/L — ABNORMAL LOW (ref 135–145)
Total Bilirubin: 0.6 mg/dL (ref 0.3–1.2)
Total Protein: 7.1 g/dL (ref 6.5–8.1)

## 2020-02-12 LAB — CBC
HCT: 49 % (ref 39.0–52.0)
Hemoglobin: 16.8 g/dL (ref 13.0–17.0)
MCH: 33.6 pg (ref 26.0–34.0)
MCHC: 34.3 g/dL (ref 30.0–36.0)
MCV: 98 fL (ref 80.0–100.0)
Platelets: 164 10*3/uL (ref 150–400)
RBC: 5 MIL/uL (ref 4.22–5.81)
RDW: 13.2 % (ref 11.5–15.5)
WBC: 6.6 10*3/uL (ref 4.0–10.5)
nRBC: 0 % (ref 0.0–0.2)

## 2020-02-12 LAB — TROPONIN I (HIGH SENSITIVITY)
Troponin I (High Sensitivity): 9 ng/L (ref <18)
Troponin I (High Sensitivity): 7 ng/L (ref <18)

## 2020-02-12 LAB — RESPIRATORY PANEL BY RT PCR (FLU A&B, COVID)
Influenza A by PCR: NEGATIVE
Influenza B by PCR: NEGATIVE
SARS Coronavirus 2 by RT PCR: NEGATIVE

## 2020-02-12 LAB — BRAIN NATRIURETIC PEPTIDE: B Natriuretic Peptide: 18 pg/mL (ref 0.0–100.0)

## 2020-02-12 LAB — LACTIC ACID, PLASMA
Lactic Acid, Venous: 1.9 mmol/L (ref 0.5–1.9)
Lactic Acid, Venous: 1.4 mmol/L (ref 0.5–1.9)

## 2020-02-12 LAB — ETHANOL: Alcohol, Ethyl (B): <10 mg/dL (ref <10)

## 2020-02-12 LAB — CBG MONITORING, ED: Glucose-Capillary: 159 mg/dL — ABNORMAL HIGH (ref 70–99)

## 2020-02-12 MED ORDER — LACTATED RINGERS IV SOLN: INTRAVENOUS | Status: DC

## 2020-02-12 MED ORDER — SODIUM CHLORIDE 0.9 % IV SOLN: 250.0000 mL | INTRAVENOUS | Status: DC | PRN

## 2020-02-12 MED ORDER — ONDANSETRON HCL 4 MG PO TABS: 4.0000 mg | ORAL_TABLET | Freq: Four times a day (QID) | ORAL | Status: DC | PRN

## 2020-02-12 MED ORDER — DIAZEPAM 5 MG PO TABS: 10.0000 mg | ORAL_TABLET | Freq: Three times a day (TID) | ORAL | Status: DC | PRN

## 2020-02-12 MED ORDER — VITAMIN D 25 MCG (1000 UNIT) PO TABS
1000.0000 [IU] | ORAL_TABLET | Freq: Every day | ORAL | Status: DC
  Administered 2020-02-13 – 2020-02-14 (×2): 1000 [IU] via ORAL
  Filled 2020-02-12 (×4): qty 1

## 2020-02-12 MED ORDER — ASCORBIC ACID 500 MG PO TABS
1000.0000 mg | ORAL_TABLET | Freq: Every day | ORAL | Status: DC
  Administered 2020-02-13 – 2020-02-14 (×2): 1000 mg via ORAL
  Filled 2020-02-12 (×2): qty 2

## 2020-02-12 MED ORDER — FLUTICASONE-UMECLIDIN-VILANT 100-62.5-25 MCG/INH IN AEPB: 1.0000 | INHALATION_SPRAY | Freq: Every day | RESPIRATORY_TRACT | Status: DC

## 2020-02-12 MED ORDER — BISACODYL 10 MG RE SUPP: 10.0000 mg | Freq: Every day | RECTAL | Status: DC | PRN

## 2020-02-12 MED ORDER — SODIUM CHLORIDE 0.9% FLUSH: 3.0000 mL | INTRAVENOUS | Status: DC | PRN

## 2020-02-12 MED ORDER — HEPARIN SODIUM (PORCINE) 5000 UNIT/ML IJ SOLN
5000.0000 [IU] | Freq: Three times a day (TID) | INTRAMUSCULAR | Status: DC
  Administered 2020-02-13 – 2020-02-14 (×6): 5000 [IU] via SUBCUTANEOUS
  Filled 2020-02-12 (×6): qty 1

## 2020-02-12 MED ORDER — ASPIRIN EC 81 MG PO TBEC
81.0000 mg | DELAYED_RELEASE_TABLET | Freq: Every day | ORAL | Status: DC
  Administered 2020-02-13 – 2020-02-14 (×2): 81 mg via ORAL
  Filled 2020-02-12 (×2): qty 1

## 2020-02-12 MED ORDER — SODIUM CHLORIDE 0.9 % IV BOLUS
1000.0000 mL | Freq: Once | INTRAVENOUS | Status: AC
  Administered 2020-02-12: 1000 mL via INTRAVENOUS

## 2020-02-12 MED ORDER — SIMVASTATIN 20 MG PO TABS
40.0000 mg | ORAL_TABLET | Freq: Every day | ORAL | Status: DC
  Administered 2020-02-13 – 2020-02-14 (×2): 40 mg via ORAL
  Filled 2020-02-12: qty 4
  Filled 2020-02-12: qty 2

## 2020-02-12 MED ORDER — TAMSULOSIN HCL 0.4 MG PO CAPS
0.8000 mg | ORAL_CAPSULE | Freq: Every day | ORAL | Status: DC
  Administered 2020-02-13 – 2020-02-14 (×2): 0.8 mg via ORAL
  Filled 2020-02-12 (×3): qty 2

## 2020-02-12 MED ORDER — ALLOPURINOL 100 MG PO TABS
300.0000 mg | ORAL_TABLET | Freq: Every day | ORAL | Status: DC
  Administered 2020-02-13 – 2020-02-14 (×2): 300 mg via ORAL
  Filled 2020-02-12 (×4): qty 3

## 2020-02-12 MED ORDER — SODIUM CHLORIDE 0.9% FLUSH
3.0000 mL | Freq: Two times a day (BID) | INTRAVENOUS | Status: DC
  Administered 2020-02-13 – 2020-02-14 (×3): 3 mL via INTRAVENOUS

## 2020-02-12 MED ORDER — ONDANSETRON HCL 4 MG/2ML IJ SOLN: 4.0000 mg | Freq: Four times a day (QID) | INTRAMUSCULAR | Status: DC | PRN

## 2020-02-12 MED ORDER — DULOXETINE HCL 60 MG PO CPEP
60.0000 mg | ORAL_CAPSULE | Freq: Every day | ORAL | Status: DC
  Administered 2020-02-13 – 2020-02-14 (×2): 60 mg via ORAL
  Filled 2020-02-12: qty 1
  Filled 2020-02-12: qty 2

## 2020-02-12 MED ORDER — CYCLOBENZAPRINE HCL 10 MG PO TABS: 10.0000 mg | ORAL_TABLET | Freq: Three times a day (TID) | ORAL | Status: DC | PRN

## 2020-02-12 NOTE — ED Provider Notes (Signed)
[facility] EMERGENCY DEPARTMENT Provider Note   CSN: [id number] Arrival date & time: 02/12/20  1933     History Chief Complaint  Patient presents with  . Near Syncope    Caleb Potter is a 59 y.o. male.  HPI    Patient presents with his wife who provides much of the history, and the patient agrees with details. Patient presents with ongoing lightheadedness, and after episode of near syncope that occurred at his house today. Patient notes that he had been doing generally well until a week ago when he developed diarrhea.  He has no abdominal pain, but he has had multiple episodes of loose stool in the interim. No vomiting, though he does have nausea, diminished intake from anorexia.  No chest pain, no dyspnea. Today, patient was feeling particularly weak, had an episode of syncope.  If notes that she witnessed the episode during which the patient fell backwards, striking his head.  Patient himself complains of pain in the posterior head, denies neck pain, denies weakness in any extremity.  Past Medical History:  Diagnosis Date  . Coronary artery disease   . History of hiatal hernia   . Hypertension   . Myocardial infarction ([place]) 2000   light MI    Patient Active Problem List   Diagnosis Date Noted  . Hypercalcemia 02/10/2019  . Bladder rupture   . Ileus, postoperative ([place])   . 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 Hypertension 01/31/2019  . Coronary artery disease 01/31/2019  . AKI (acute kidney injury) ([place]) 01/31/2019  . Hyperkalemia 01/31/2019  . Anxiety 01/31/2019  . Intraperitoneal hematoma 01/30/2019    Past Surgical History:  Procedure Laterality Date  . BLADDER REPAIR  01/30/2019   Procedure: Bladder Repair;  Surgeon: Irine Seal, MD;  Location: [place];  Service: Urology;;  . Wilmon Pali RELEASE  2010   right wrist  . CORONARY ANGIOPLASTY  2000  . CYST REMOVAL NECK  2008  . CYSTOSCOPY WITH RETROGRADE PYELOGRAM, URETEROSCOPY AND STENT  PLACEMENT Bilateral 11/07/2014   Procedure: CYSTOSCOPY WITH RETROGRADE PYELOGRAM, URETEROSCOPY AND STENT PLACEMENT;  Surgeon: Alexis Frock, MD;  Location: [facility];  Service: Urology;  Laterality: Bilateral;  . HERNIA REPAIR  1994   left inguinal  . HIATAL HERNIA REPAIR  2007  . HOLMIUM LASER APPLICATION Bilateral 3/47/4259   Procedure: HOLMIUM LASER APPLICATION;  Surgeon: Alexis Frock, MD;  Location: [facility];  Service: Urology;  Laterality: Bilateral;  . LAPAROTOMY N/A 01/30/2019   Procedure: EXPLORATORY LAPAROTOMY;  Surgeon: Irine Seal, MD;  Location: [place];  Service: Urology;  Laterality: N/A;  . right knee reconstruction  2010   right       History reviewed. No pertinent family history.  Social History   Tobacco Use  . Smoking status: Current Every Day Smoker    Packs/day: 1.00    Years: 40.00    Pack years: 40.00    Types: Cigarettes  . Smokeless tobacco: Former Network engineer Use Topics  . Alcohol use: Yes    Comment: occassionally  . Drug use: Not on file    Home Medications Prior to Admission medications   Medication Sig Start Date End Date Taking? Authorizing Provider  allopurinol (ZYLOPRIM) 300 MG tablet Take 300 mg by mouth daily.    [provider]  Ascorbic Acid (VITAMIN C) 1000 MG tablet Take 1,000 mg by mouth daily.    [provider]  aspirin EC 81 MG tablet Take 81 mg by mouth daily.  [provider]  celecoxib (CELEBREX) 200 MG capsule Take 200 mg by mouth daily.    [provider]  cholecalciferol (VITAMIN D) 1000 UNITS tablet Take 1,000 Units by mouth daily.    [provider]  diazepam (VALIUM) 10 MG tablet Take 10 mg by mouth 3 (three) times daily as needed for anxiety.     [provider]  DULoxetine (CYMBALTA) 60 MG capsule Take 60 mg by mouth daily. For pain and depression    [provider]  finasteride (PROSCAR) 5 MG tablet Take 1 tablet (5 mg total) by mouth daily. 02/11/19   Bjorn Pippin, MD  Fluticasone-Umeclidin-Vilant (TRELEGY ELLIPTA) 100-62.5-25 MCG/INH AEPB Inhale 1 puff into the lungs daily. For COPD    [provider]  HYDROcodone-acetaminophen (NORCO/VICODIN) 5-325 MG tablet Take 1 tablet by mouth every 6 (six) hours as needed for moderate pain. 02/11/19   Bjorn Pippin, MD  lisinopril (PRINIVIL,ZESTRIL) 20 MG tablet Take 20 mg by mouth daily. For high blood pressure    [provider]  metoprolol succinate (TOPROL-XL) 50 MG 24 hr tablet Take 50 mg by mouth daily. Take with or immediately following a meal.     [provider]  Omega-3 Fatty Acids (FISH OIL) 1200 MG CAPS Take 1,200 mg by mouth daily.    [provider]  omeprazole (PRILOSEC) 20 MG capsule Take 20 mg by mouth daily. For acid reflux    [provider]  polyethylene glycol (MIRALAX) 17 g packet Take 17 g by mouth daily. 02/11/19   Bjorn Pippin, MD  simvastatin (ZOCOR) 40 MG tablet Take 40 mg by mouth daily. For high cholesterol    [provider]  tamsulosin (FLOMAX) 0.4 MG CAPS capsule Take 2 capsules (0.8 mg total) by mouth daily. 02/11/19   Bjorn Pippin, MD    Allergies    Citalopram  Review of Systems   Review of Systems  Constitutional:       Per HPI, otherwise negative  HENT:       Per HPI, otherwise negative  Respiratory:       Per HPI, otherwise negative  Cardiovascular:       Per HPI, otherwise negative  Gastrointestinal: Positive for diarrhea and nausea. Negative for abdominal pain and vomiting.  Endocrine:       Negative aside from HPI  Genitourinary:       Neg aside from HPI   Musculoskeletal:       Per HPI, otherwise negative  Skin: Negative.   Neurological: Positive for syncope, weakness and light-headedness.    Physical Exam Updated Vital Signs BP (!) 86/61   Pulse (!) 101   Temp 98.3 F (36.8 C) (Oral)   Resp (!) 30   Ht 5\' 6"  (1.676 m)   Wt 102.1 kg   SpO2 99%   BMI 36.32 kg/m   Physical Exam Vitals and nursing  note reviewed.  Constitutional:      General: He is not in acute distress.    Appearance: He is well-developed.  HENT:     Head: Normocephalic and atraumatic.  Eyes:     Conjunctiva/sclera: Conjunctivae normal.  Cardiovascular:     Rate and Rhythm: Normal rate and regular rhythm.  Pulmonary:     Effort: Pulmonary effort is normal. No respiratory distress.     Breath sounds: No stridor.  Abdominal:     General: There is no distension.     Comments: Protuberant, diffusely, mildly tender abdomen, with no rebound  Skin:    General: Skin is warm and dry.  Neurological:     Mental Status: He is alert and oriented to person, place, and time.     Cranial Nerves: No dysarthria.     Motor: Weakness and atrophy present. No tremor or abnormal muscle tone.     Coordination: Coordination normal.      ED Results / Procedures / Treatments   Labs (all labs ordered are listed, but only abnormal results are displayed) Labs Reviewed  COMPREHENSIVE METABOLIC PANEL - Abnormal; Notable for the following components:      Result Value   Sodium 128 (*)    Potassium 2.8 (*)    Chloride 91 (*)    Glucose, Bld 128 (*)    BUN 45 (*)    Creatinine, Ser 4.80 (*)    Calcium 8.5 (*)    GFR calc non Af Amer 12 (*)    GFR calc Af Amer 14 (*)    All other components within normal limits  RESPIRATORY PANEL BY RT PCR (FLU A&B, COVID)  CBC  ETHANOL  LACTIC ACID, PLASMA  BRAIN NATRIURETIC PEPTIDE  URINALYSIS, ROUTINE W REFLEX MICROSCOPIC  LACTIC ACID, PLASMA  CBG MONITORING, ED  TROPONIN I (HIGH SENSITIVITY)  TROPONIN I (HIGH SENSITIVITY)    EKG  Sinus tach, 105, nonspecific T wave abnormalities abnormal EKG  Radiology CT ABDOMEN PELVIS WO CONTRAST  Result Date: 02/12/2020 CLINICAL DATA:  Dizziness, fell, diarrhea for 5 days EXAM: CT ABDOMEN AND PELVIS WITHOUT CONTRAST TECHNIQUE: Multidetector CT imaging of the abdomen and pelvis was performed following the standard protocol without IV contrast.  COMPARISON:  02/04/2019 FINDINGS: Lower chest: No acute pleural or parenchymal lung disease. Hepatobiliary: Gallbladder sludge and small calcified gallstones identified without cholecystitis. Liver is unremarkable. Pancreas: Unremarkable. No pancreatic ductal dilatation or surrounding inflammatory changes. Spleen: Normal in size without focal abnormality. Adrenals/Urinary Tract: Stable 6 mm nonobstructing right renal calculus. Left kidney is unremarkable. The ureters are grossly normal. Bladder is decompressed which limits its evaluation. The adrenals are unremarkable. Stomach/Bowel: Gas fluid levels are seen throughout the large and small bowel, with no evidence of obstruction. Findings likely reflect gastroenteritis and diarrhea. No bowel obstruction or ileus. No bowel wall thickening or inflammatory change. Normal appendix right lower quadrant. Vascular/Lymphatic: Aortic atherosclerosis. No enlarged abdominal or pelvic lymph nodes. Reproductive: Prostate is unremarkable. Other: No abdominal wall hernia or abnormality. No abdominopelvic ascites. Musculoskeletal: No acute or destructive bony lesions. Reconstructed images demonstrate no additional findings. IMPRESSION: 1. Diffuse gas fluid levels throughout the large and small bowel, consistent with gastroenteritis and diarrhea. 2. Nonobstructing 6 mm right renal calculus. 3.  Aortic Atherosclerosis (ICD10-I70.0). Electronically Signed   By: Sharlet Salina M.D.   On: 02/12/2020 21:46   CT Head Wo Contrast  Result Date: 02/12/2020 CLINICAL DATA:  Syncopal episode. EXAM: CT HEAD WITHOUT CONTRAST TECHNIQUE: Contiguous axial images were obtained from the base of the skull through the vertex without intravenous contrast. COMPARISON:  None. FINDINGS: Brain: There is mild cerebral atrophy with widening of the extra-axial spaces and ventricular dilatation. There are areas of decreased attenuation within the white matter tracts of the supratentorial brain, consistent  with microvascular disease changes. Vascular: No hyperdense vessel or unexpected calcification. Skull: Normal. Negative for fracture or focal lesion. Sinuses/Orbits: No acute finding. Other: None. IMPRESSION: No acute intracranial pathology. Electronically Signed   By: Aram Candela M.D.   On: 02/12/2020 21:24   DG Chest Port 1 View  Result Date: 02/12/2020  CLINICAL DATA:  Syncope, hypotensive EXAM: PORTABLE CHEST 1 VIEW COMPARISON:  01/30/2019 FINDINGS: The heart size and mediastinal contours are within normal limits. Both lungs are clear. The visualized skeletal structures are unremarkable. IMPRESSION: No active disease. Electronically Signed   By: Sharlet Salina M.D.   On: 02/12/2020 20:38    Procedures Procedures (including critical care time)  CRITICAL CARE Performed by: Gerhard Munch Total critical care time: 35 minutes Critical care time was exclusive of separately billable procedures and treating other patients. Critical care was necessary to treat or prevent imminent or life-threatening deterioration. Critical care was time spent personally by me on the following activities: development of treatment plan with patient and/or surrogate as well as nursing, discussions with consultants, evaluation of patient's response to treatment, examination of patient, obtaining history from patient or surrogate, ordering and performing treatments and interventions, ordering and review of laboratory studies, ordering and review of radiographic studies, pulse oximetry and re-evaluation of patient's condition.   Medications Ordered in ED Medications  sodium chloride 0.9 % bolus 1,000 mL (1,000 mLs Intravenous New Bag/Given 02/12/20 2100)    ED Course  I have reviewed the triage vital signs and the nursing notes.  Pertinent labs & imaging results that were available during my care of the patient were reviewed by me and considered in my medical decision making (see chart for details).   With  consideration of dehydration, electrolyte abnormalities, infection either either abdominal or septic, as well as cardiac arrhythmia the patient had labs, CT head, CT abdomen pelvis, chest x-ray, all fluids were started. MDM Rules/Calculators/A&P                      10:15 PM Blood pressure has improved substantially, with a map now greater than 65 after initially being below 55. Patient has received initial fluid resuscitation and will continue to require additional liters.  Findings thus far notable for acute kidney injury with creatinine greater than 4 which is new, hyponatremia, hypokalemia. CT abdomen pelvis, CT head did not demonstrate acute intraperitoneal findings, though there are findings consistent with gastroenteritis, likely contributing the patient's diarrhea.  This adult male presents with 1 week of ongoing diarrhea, weakness, now after episode of syncope, collapse.  Patient found to be hypotensive, but awake on arrival.  Patient's critically abnormal vital signs were treated with immediate fluid repletion.   MDM Number of Diagnoses or Management Options   Amount and/or Complexity of Data Reviewed Clinical lab tests: ordered and reviewed Tests in the radiology section of CPT: reviewed Tests in the medicine section of CPT: reviewed Decide to obtain previous medical records or to obtain history from someone other than the patient: yes Obtain history from someone other than the patient: yes Review and summarize past medical records: yes Discuss the patient with other providers: yes Independent visualization of images, tracings, or specimens: yes  Risk of Complications, Morbidity, and/or Mortality Presenting problems: high Diagnostic procedures: high Management options: high    Final Clinical Impression(s) / ED Diagnoses Syncope Acute kidney injury Diarrhea Hypotension   Gerhard Munch, MD 02/12/20 2218

## 2020-02-12 NOTE — ED Triage Notes (Signed)
Pt states he "thinks he passed out at home". States he feels dizzy and per registration, pt fell in parking lot on way in. Pt states he "can't remember much".

## 2020-02-12 NOTE — H&P (Signed)
History and Physical    Patient Demographics:    Caleb Potter AVW:[id number] DOB: September 29, 1961 DOA: 02/12/2020  PCP: [name], MD  Patient coming from: Home  I have personally briefly reviewed patient's old medical records in [facility]  Chief Complaint: Diarrhea, near syncope   Assessment & Plan:     Assessment/Plan Principal Problem:   Syncope Active Problems:   Hypertension   AKI (acute kidney injury) (HCC)   Anxiety   Hypokalemia   Hyponatremia   Gastroenteritis     Principal Problem: Acute gastroenteritis Patient presented with a 1 week history of diarrhea, nausea, decreased p.o. intake.  Found to have worsening renal function on presentation.  CT of the abdomen shows presentation consistent with gastroenteritis. Likely viral etiology.  No fever or leukocytosis noted.  Covid test is negative. -IV fluid resuscitation  -Monitor input output, renal function  Other Active Problems: Syncope  Patient presented with several days of diarrhea, decreased p.o. intake.  Had a syncopal event today when he tried to stand up and passed out for a few seconds.  No head injury reported.  Neurological exam is benign.  Head CT is negative. EKG shows tachycardia. Troponin is negative.  Noted to have worsening renal function.  Likely orthostatic in etiology.  Other possible differential is vasovagal.  Less likely to be cardiac arrhythmia. -telemetry monitoring, serial troponins  -orthostatic vital signs -ECHO in AM  Acute Renal Failure Creat 4.8 on presentation, baseline is around 1 -IV fluid resuscitation   Hyponatremia Likely secondary to dehydration   Hypokalemia Likely due to diarrhea -monitor and supplement electrolytes   Coronary Artery Disease -continue aspirin   Hypertension -hold antihypertensives  Gout  -continue allopurinol  GERD  -will place on IV PPI  Hyperlipidemia -continue simvastatin, omega-3 fatty acids   Depression /  Anxiety -continue Cymbalta, Diazepam prn  BPH -continue tamsulosin, finasteride   COPD -stable -continue Trelegy Ellipta   DVT prophylaxis: Heparin Code Status:  Full code Family Communication: N/A  Disposition Plan: admitted as inpatient for syncope, renal failure  Consults called: N/A Admission status: inpatient status     HPI:     HPI: Caleb Potter is a 59 y.o. male with medical history significant of hypertension, COPD, hyperlipidemia, gout who presented to the ER with diarrhea, syncope.  Patient has been having a 1 week history of diarrhea with at least 4-5 episodes of loose stool daily.  Is also had decreased p.o. intake, nausea and intermittent vomiting.  He said some generalized abdominal discomfort but no pain.  No hematemesis, melena, hematochezia, dysuria, hematuria noted.  No fever, chills, chest pain, shortness of breath, seizures, focal deficits. Patient was found to have worsening renal function as noted below.  CT showed evidence of gastroenteritis.  Noted to be hypotensive on initial arrival.  Good response to IV fluid resuscitation. ED Course:  Vital Signs reviewed on presentation, significant for temperature 98.3, HR 101, BP 86/61, saturation 99% on room air.  Labs reviewed, significant for sodium 128, potassium 2.8, Cl 91, BUN 45, Creat 4.8, LFTs wnl, BNP 18, troponin 9, lactic acid 1.9, WBC 6.6, Hb 16.8, Hct 49, platelets 164, alcohol negative,  Imaging personally Reviewed, CT head shows no acute intracranial pathology, CT abdomen shows diffuse gas fluid levels throughout large and small bowel, consistent with gastroenteritis and diarrhea. Chest Xray shows no acute cardiopulmonary disease.  EKG personally reviewed, shows sinus tachycardia.     Review of systems:    Review of Systems:  As per HPI otherwise 10 point review of systems negative.  All other review of systems is negative except the ones noted above in the HPI.    Past Medical and Surgical History:   Reviewed by me  Past Medical History:  Diagnosis Date  . Coronary artery disease   . History of hiatal hernia   . Hypertension   . Myocardial infarction (HCC) 2000   light MI    Past Surgical History:  Procedure Laterality Date  . BLADDER REPAIR  01/30/2019   Procedure: Bladder Repair;  Surgeon: Bjorn Pippin, MD;  Location: [facility] OR;  Service: Urology;;  . Fidela Salisbury RELEASE  2010   right wrist  . CORONARY ANGIOPLASTY  2000  . CYST REMOVAL NECK  2008  . CYSTOSCOPY WITH RETROGRADE PYELOGRAM, URETEROSCOPY AND STENT PLACEMENT Bilateral 11/07/2014   Procedure: CYSTOSCOPY WITH RETROGRADE PYELOGRAM, URETEROSCOPY AND STENT PLACEMENT;  Surgeon: Sebastian Ache, MD;  Location: [facility] ORS;  Service: Urology;  Laterality: Bilateral;  . HERNIA REPAIR  1994   left inguinal  . HIATAL HERNIA REPAIR  2007  . HOLMIUM LASER APPLICATION Bilateral 11/07/2014   Procedure: HOLMIUM LASER APPLICATION;  Surgeon: Sebastian Ache, MD;  Location: [facility] ORS;  Service: Urology;  Laterality: Bilateral;  . LAPAROTOMY N/A 01/30/2019   Procedure: EXPLORATORY LAPAROTOMY;  Surgeon: Bjorn Pippin, MD;  Location: [facility] OR;  Service: Urology;  Laterality: N/A;  . right knee reconstruction  2010   right     Social History:  Reviewed by me   reports that he has been smoking cigarettes. He has a 40.00 pack-year smoking history. He has quit using smokeless tobacco. He reports current alcohol use. No history on file for drug.  Allergies:    Allergies  Allergen Reactions  . Citalopram Other (See Comments)    Grind teeth. "high power cocaine"     Family History :   History reviewed. No pertinent family history. Family history reviewed, noted as above, not pertinent to current presentation.   Home Medications:    Prior to Admission medications   Medication Sig Start Date End Date Taking? Authorizing Provider  allopurinol (ZYLOPRIM) 300 MG tablet Take 300 mg by mouth daily.    [provider]  Ascorbic Acid (VITAMIN  C) 1000 MG tablet Take 1,000 mg by mouth daily.    [provider]  aspirin EC 81 MG tablet Take 81 mg by mouth daily.    [provider]  celecoxib (CELEBREX) 200 MG capsule Take 200 mg by mouth daily.    [provider]  cholecalciferol (VITAMIN D) 1000 UNITS tablet Take 1,000 Units by mouth daily.    [provider]  diazepam (VALIUM) 10 MG tablet Take 10 mg by mouth 3 (three) times daily as needed for anxiety.     [provider]  DULoxetine (CYMBALTA) 60 MG capsule Take 60 mg by mouth daily. For pain and depression    [provider]  finasteride (PROSCAR) 5 MG tablet Take 1 tablet (5 mg total) by mouth daily. 02/11/19   Bjorn Pippin, MD  Fluticasone-Umeclidin-Vilant (TRELEGY ELLIPTA) 100-62.5-25 MCG/INH AEPB Inhale 1 puff into the lungs daily. For COPD    [provider]  HYDROcodone-acetaminophen (NORCO/VICODIN) 5-325 MG tablet Take 1 tablet by mouth every 6 (six) hours as needed for moderate pain. 02/11/19   Bjorn Pippin, MD  lisinopril (PRINIVIL,ZESTRIL) 20 MG tablet Take 20 mg by mouth daily. For high blood pressure    [provider]  metoprolol succinate (TOPROL-XL) 50  MG 24 hr tablet Take 50 mg by mouth daily. Take with or immediately following a meal.     [provider]  Omega-3 Fatty Acids (FISH OIL) 1200 MG CAPS Take 1,200 mg by mouth daily.    [provider]  omeprazole (PRILOSEC) 20 MG capsule Take 20 mg by mouth daily. For acid reflux    [provider]  polyethylene glycol (MIRALAX) 17 g packet Take 17 g by mouth daily. 02/11/19   Bjorn PippinWrenn, John, MD  simvastatin (ZOCOR) 40 MG tablet Take 40 mg by mouth daily. For high cholesterol    [provider]  tamsulosin (FLOMAX) 0.4 MG CAPS capsule Take 2 capsules (0.8 mg total) by mouth daily. 02/11/19   Bjorn PippinWrenn, John, MD    Physical Exam:    Physical Exam: Vitals:   02/12/20 2024 02/12/20 2045 02/12/20 2100 02/12/20 2115  BP:  (!)  66/47 (!) 70/54 (!) 86/61  Pulse:  (!) 103 100 (!) 101  Resp:  (!) 25 (!) 21 (!) 30  Temp:      TempSrc:      SpO2:  95% 94% 99%  Weight: 102.1 kg     Height: 5\' 6"  (1.676 m)       Constitutional: NAD, calm, comfortable Vitals:   02/12/20 2024 02/12/20 2045 02/12/20 2100 02/12/20 2115  BP:  (!) 66/47 (!) 70/54 (!) 86/61  Pulse:  (!) 103 100 (!) 101  Resp:  (!) 25 (!) 21 (!) 30  Temp:      TempSrc:      SpO2:  95% 94% 99%  Weight: 102.1 kg     Height: 5\' 6"  (1.676 m)      Eyes: PERRL, lids and conjunctivae normal ENMT: Mucous membranes are dry. Posterior pharynx clear of any exudate or lesions.Normal dentition.  Neck: normal, supple, no masses, no thyromegaly Respiratory: clear to auscultation bilaterally, no wheezing, no crackles. Normal respiratory effort. No accessory muscle use.  Cardiovascular: Regular rate and rhythm, no murmurs / rubs / gallops. No extremity edema. 2+ pedal pulses. No carotid bruits.  Abdomen: no tenderness, no masses palpated. No hepatosplenomegaly. Bowel sounds positive.  Musculoskeletal: no clubbing / cyanosis. No joint deformity upper and lower extremities. Good ROM, no contractures. Normal muscle tone.  Skin: no rashes, lesions, ulcers. No induration Neurologic: CN 2-12 grossly intact. Sensation intact, DTR normal. Strength 5/5 in all 4.  Psychiatric: Normal judgment and insight. Alert and oriented x 3. Normal mood.    Decubitus Ulcers: Not present on admission Catheters and tubes: None  Data Review:    Labs on Admission: I have personally reviewed following labs and imaging studies  CBC: Recent Labs  Lab 02/12/20 2026  WBC 6.6  HGB 16.8  HCT 49.0  MCV 98.0  PLT 164   Basic Metabolic Panel: Recent Labs  Lab 02/12/20 2027  NA 128*  K 2.8*  CL 91*  CO2 22  GLUCOSE 128*  BUN 45*  CREATININE 4.80*  CALCIUM 8.5*   GFR: Estimated Creatinine Clearance: 18.3 mL/min (A) (by C-G formula based on SCr of 4.8 mg/dL (H)). Liver Function  Tests: Recent Labs  Lab 02/12/20 2027  AST 34  ALT 32  ALKPHOS 78  BILITOT 0.6  PROT 7.1  ALBUMIN 3.6   No results for input(s): LIPASE, AMYLASE in the last 168 hours. No results for input(s): AMMONIA in the last 168 hours. Coagulation Profile: No results for input(s): INR, PROTIME in the last 168 hours. Cardiac Enzymes: No results for input(s):  CKTOTAL, CKMB, CKMBINDEX, TROPONINI in the last 168 hours. BNP (last 3 results) No results for input(s): PROBNP in the last 8760 hours. HbA1C: No results for input(s): HGBA1C in the last 72 hours. CBG: No results for input(s): GLUCAP in the last 168 hours. Lipid Profile: No results for input(s): CHOL, HDL, LDLCALC, TRIG, CHOLHDL, LDLDIRECT in the last 72 hours. Thyroid Function Tests: No results for input(s): TSH, T4TOTAL, FREET4, T3FREE, THYROIDAB in the last 72 hours. Anemia Panel: No results for input(s): VITAMINB12, FOLATE, FERRITIN, TIBC, IRON, RETICCTPCT in the last 72 hours. Urine analysis:    Component Value Date/Time   COLORURINE YELLOW 01/31/2019 0312   APPEARANCEUR CLEAR 01/31/2019 0312   LABSPEC 1.015 01/31/2019 0312   PHURINE 5.0 01/31/2019 0312   GLUCOSEU 50 (A) 01/31/2019 0312   HGBUR LARGE (A) 01/31/2019 0312   BILIRUBINUR NEGATIVE 01/31/2019 0312   KETONESUR NEGATIVE 01/31/2019 0312   PROTEINUR NEGATIVE 01/31/2019 0312   NITRITE NEGATIVE 01/31/2019 0312   LEUKOCYTESUR NEGATIVE 01/31/2019 8563     Imaging Results:      Radiological Exams on Admission: CT ABDOMEN PELVIS WO CONTRAST  Result Date: 02/12/2020 CLINICAL DATA:  Dizziness, fell, diarrhea for 5 days EXAM: CT ABDOMEN AND PELVIS WITHOUT CONTRAST TECHNIQUE: Multidetector CT imaging of the abdomen and pelvis was performed following the standard protocol without IV contrast. COMPARISON:  02/04/2019 FINDINGS: Lower chest: No acute pleural or parenchymal lung disease. Hepatobiliary: Gallbladder sludge and small calcified gallstones identified without  cholecystitis. Liver is unremarkable. Pancreas: Unremarkable. No pancreatic ductal dilatation or surrounding inflammatory changes. Spleen: Normal in size without focal abnormality. Adrenals/Urinary Tract: Stable 6 mm nonobstructing right renal calculus. Left kidney is unremarkable. The ureters are grossly normal. Bladder is decompressed which limits its evaluation. The adrenals are unremarkable. Stomach/Bowel: Gas fluid levels are seen throughout the large and small bowel, with no evidence of obstruction. Findings likely reflect gastroenteritis and diarrhea. No bowel obstruction or ileus. No bowel wall thickening or inflammatory change. Normal appendix right lower quadrant. Vascular/Lymphatic: Aortic atherosclerosis. No enlarged abdominal or pelvic lymph nodes. Reproductive: Prostate is unremarkable. Other: No abdominal wall hernia or abnormality. No abdominopelvic ascites. Musculoskeletal: No acute or destructive bony lesions. Reconstructed images demonstrate no additional findings. IMPRESSION: 1. Diffuse gas fluid levels throughout the large and small bowel, consistent with gastroenteritis and diarrhea. 2. Nonobstructing 6 mm right renal calculus. 3.  Aortic Atherosclerosis (ICD10-I70.0). Electronically Signed   By: Randa Ngo M.D.   On: 02/12/2020 21:46   CT Head Wo Contrast  Result Date: 02/12/2020 CLINICAL DATA:  Syncopal episode. EXAM: CT HEAD WITHOUT CONTRAST TECHNIQUE: Contiguous axial images were obtained from the base of the skull through the vertex without intravenous contrast. COMPARISON:  None. FINDINGS: Brain: There is mild cerebral atrophy with widening of the extra-axial spaces and ventricular dilatation. There are areas of decreased attenuation within the white matter tracts of the supratentorial brain, consistent with microvascular disease changes. Vascular: No hyperdense vessel or unexpected calcification. Skull: Normal. Negative for fracture or focal lesion. Sinuses/Orbits: No acute  finding. Other: None. IMPRESSION: No acute intracranial pathology. Electronically Signed   By: Virgina Norfolk M.D.   On: 02/12/2020 21:24   DG Chest Port 1 View  Result Date: 02/12/2020 CLINICAL DATA:  Syncope, hypotensive EXAM: PORTABLE CHEST 1 VIEW COMPARISON:  01/30/2019 FINDINGS: The heart size and mediastinal contours are within normal limits. Both lungs are clear. The visualized skeletal structures are unremarkable. IMPRESSION: No active disease. Electronically Signed   By: Diana Eves.D.  On: 02/12/2020 20:38      Ayomide Zuleta Cyndie Chime MD [facility]  If 7PM-7AM, please contact night-coverage   02/12/2020, 10:30 PM

## 2020-02-12 NOTE — ED Notes (Signed)
Pt has no dizziness or light headedness while standing

## 2020-02-13 ENCOUNTER — Ambulatory Visit (HOSPITAL_COMMUNITY): Payer: 59

## 2020-02-13 ENCOUNTER — Encounter (HOSPITAL_COMMUNITY): Payer: Self-pay

## 2020-02-13 ENCOUNTER — Encounter (HOSPITAL_COMMUNITY): Payer: Self-pay | Admitting: Family Medicine

## 2020-02-13 ENCOUNTER — Inpatient Hospital Stay (HOSPITAL_COMMUNITY): Payer: 59

## 2020-02-13 ENCOUNTER — Other Ambulatory Visit: Payer: Self-pay

## 2020-02-13 DIAGNOSIS — K529 Noninfective gastroenteritis and colitis, unspecified: Secondary | ICD-10-CM

## 2020-02-13 DIAGNOSIS — R55 Syncope and collapse: Secondary | ICD-10-CM

## 2020-02-13 DIAGNOSIS — I10 Essential (primary) hypertension: Secondary | ICD-10-CM

## 2020-02-13 DIAGNOSIS — N179 Acute kidney failure, unspecified: Principal | ICD-10-CM

## 2020-02-13 LAB — URINALYSIS, ROUTINE W REFLEX MICROSCOPIC
Glucose, UA: NEGATIVE mg/dL
Hgb urine dipstick: NEGATIVE
Ketones, ur: NEGATIVE mg/dL
Nitrite: NEGATIVE
Protein, ur: 100 mg/dL — AB
Specific Gravity, Urine: 1.026 (ref 1.005–1.030)
pH: 5 (ref 5.0–8.0)

## 2020-02-13 LAB — CBC
HCT: 47 % (ref 39.0–52.0)
Hemoglobin: 15.9 g/dL (ref 13.0–17.0)
MCH: 33.5 pg (ref 26.0–34.0)
MCHC: 33.8 g/dL (ref 30.0–36.0)
MCV: 98.9 fL (ref 80.0–100.0)
Platelets: 165 10*3/uL (ref 150–400)
RBC: 4.75 MIL/uL (ref 4.22–5.81)
RDW: 13.1 % (ref 11.5–15.5)
WBC: 6.1 10*3/uL (ref 4.0–10.5)
nRBC: 0 % (ref 0.0–0.2)

## 2020-02-13 LAB — MAGNESIUM: Magnesium: 1.8 mg/dL (ref 1.7–2.4)

## 2020-02-13 LAB — BASIC METABOLIC PANEL
Anion gap: 15 (ref 5–15)
BUN: 49 mg/dL — ABNORMAL HIGH (ref 6–20)
CO2: 24 mmol/L (ref 22–32)
Calcium: 8.2 mg/dL — ABNORMAL LOW (ref 8.9–10.3)
Chloride: 91 mmol/L — ABNORMAL LOW (ref 98–111)
Creatinine, Ser: 3.91 mg/dL — ABNORMAL HIGH (ref 0.61–1.24)
GFR calc Af Amer: 18 mL/min — ABNORMAL LOW (ref >60)
GFR calc non Af Amer: 16 mL/min — ABNORMAL LOW (ref >60)
Glucose, Bld: 114 mg/dL — ABNORMAL HIGH (ref 70–99)
Potassium: 2.8 mmol/L — ABNORMAL LOW (ref 3.5–5.1)
Sodium: 130 mmol/L — ABNORMAL LOW (ref 135–145)

## 2020-02-13 LAB — GLUCOSE, CAPILLARY: Glucose-Capillary: 109 mg/dL — ABNORMAL HIGH (ref 70–99)

## 2020-02-13 LAB — TROPONIN I (HIGH SENSITIVITY)
Troponin I (High Sensitivity): 6 ng/L (ref <18)
Troponin I (High Sensitivity): 6 ng/L (ref <18)
Troponin I (High Sensitivity): 6 ng/L (ref <18)

## 2020-02-13 LAB — ECHOCARDIOGRAM COMPLETE
Height: 66 in
Weight: 3600 oz

## 2020-02-13 LAB — C DIFFICILE QUICK SCREEN W PCR REFLEX
C Diff antigen: NEGATIVE
C Diff interpretation: NOT DETECTED
C Diff toxin: NEGATIVE

## 2020-02-13 LAB — HIV ANTIBODY (ROUTINE TESTING W REFLEX): HIV Screen 4th Generation wRfx: NONREACTIVE

## 2020-02-13 MED ORDER — POTASSIUM CHLORIDE CRYS ER 20 MEQ PO TBCR
40.0000 meq | EXTENDED_RELEASE_TABLET | ORAL | Status: AC
  Administered 2020-02-13 (×3): 40 meq via ORAL
  Filled 2020-02-13 (×3): qty 2

## 2020-02-13 MED ORDER — METOPROLOL TARTRATE 25 MG PO TABS
25.0000 mg | ORAL_TABLET | Freq: Two times a day (BID) | ORAL | Status: DC
  Administered 2020-02-13 – 2020-02-14 (×2): 25 mg via ORAL
  Filled 2020-02-13 (×2): qty 1

## 2020-02-13 MED ORDER — UMECLIDINIUM BROMIDE 62.5 MCG/INH IN AEPB
1.0000 | INHALATION_SPRAY | Freq: Every day | RESPIRATORY_TRACT | Status: DC
  Administered 2020-02-14: 1 via RESPIRATORY_TRACT
  Filled 2020-02-13: qty 7

## 2020-02-13 MED ORDER — SODIUM CHLORIDE 0.9 % IV SOLN: INTRAVENOUS | Status: DC

## 2020-02-13 MED ORDER — SODIUM CHLORIDE 0.9 % IV BOLUS
1000.0000 mL | Freq: Once | INTRAVENOUS | Status: AC
  Administered 2020-02-13: 1000 mL via INTRAVENOUS

## 2020-02-13 MED ORDER — LACTATED RINGERS IV BOLUS
500.0000 mL | Freq: Once | INTRAVENOUS | Status: AC
  Administered 2020-02-13: 10:00:00 500 mL via INTRAVENOUS

## 2020-02-13 MED ORDER — FLUTICASONE FUROATE-VILANTEROL 100-25 MCG/INH IN AEPB
1.0000 | INHALATION_SPRAY | Freq: Every day | RESPIRATORY_TRACT | Status: DC
  Administered 2020-02-14: 1 via RESPIRATORY_TRACT
  Filled 2020-02-13: qty 28

## 2020-02-13 MED ORDER — METOPROLOL TARTRATE 25 MG PO TABS
25.0000 mg | ORAL_TABLET | Freq: Once | ORAL | Status: AC
  Administered 2020-02-13: 25 mg via ORAL
  Filled 2020-02-13: qty 1

## 2020-02-13 NOTE — ED Notes (Signed)
Report called to 300, pt's resp rate in 30's, will notify dr Mariea Clonts to verify bed placement due to MUSE score. Dr Mariea Clonts notifiedand will decrease ivf rate to 50, pt is 94-95% on room air, bicarb is good. Will continue with transfer to third floor and dr will see pt when he arrives there.

## 2020-02-13 NOTE — ED Notes (Signed)
Pt was given breakfast tray. Pt states that he does not want it because it will go straight through him.

## 2020-02-13 NOTE — Progress Notes (Signed)
Hospitalist made aware of MEWs score of 4 d/t temp 102.6 and pulse 117. New orders given and started. Pt has no distress. Call bell in reach. Bed in low position.

## 2020-02-13 NOTE — Progress Notes (Signed)
*  PRELIMINARY RESULTS* Echocardiogram 2D Echocardiogram has been performed.  Caleb Potter 02/13/2020, 8:50 AM

## 2020-02-13 NOTE — Progress Notes (Signed)
Patient Demographics:    Caleb Potter, is a 59 y.o. male, DOB - 1961/07/22, NAT:[id number]  Admit date - 02/12/2020   Admitting Physician Kahron Kauth Denton Brick, MD  Outpatient Primary MD for the patient is Caryl Bis, MD  LOS - 1   Chief Complaint  Patient presents with  . Near Syncope        Subjective:    Caleb Potter today has no fevers, no emesis,  No chest pain,   --Dizziness improving after IV fluids -Nausea persist, -Patient is reluctant to eat because he is worried about further diarrhea  Assessment  & Plan :    Principal Problem:   Syncope Active Problems:   Hypertension   AKI (acute kidney injury) ([place])   Anxiety   Hypokalemia   Hyponatremia   Gastroenteritis   Acute renal failure (ARF) (H[facility])  Brief Summary:- 59 year old male with past medical history relevant for HTN, COPD, HLD and gout who presents to the ED with near syncopal episode after having diarrhea for at least a week along with nausea and poor oral intake -Admitted on 02/12/2020 with dehydration and creatinine above 4 with a baseline around 1  A/p 1)AKI----acute kidney injury due to dehydration in the setting of persistent diarrhea and poor oral intake-compounded by lisinopril and Celebrex use  creatinine on admission=4.8  , baseline creatinine =  1.0  , creatinine is now=3.9  ,  --renally adjust medications, avoid nephrotoxic agents / dehydration  / hypotension -Continue to hydrate IV and orally -Continue to hold lisinopril and Celebrex  2)FEN--hyponatremia/hypokalemia--due to GI losses, potassium is down to 2.8, sodium is 130 replace and recheck  3)HTN--patient presented with near syncope in the setting of dehydration and low blood pressure with AKI -Continue to hold lisinopril -Okay to restart low-dose metoprolol as BP tolerates  4) near syncope--- suspect due to orthostatic BP collapse in a dehydrated patient  with hypotension--- Echo pending -Troponins and EKG unremarkable -CT head unremarkable  5)COPD--stable, no acute exacerbation, continue bronchodilators  6)HLD-stable, continue simvastatin and aspirin  7)BPH--stable, continue Flomax, consider stopping Flomax due to dizziness persist  8) gastroenteritis with persistent diarrhea--- for over a week, stool for C. difficile and GI pathogen requested--- CT abdomen and pelvis suggestive of gastroenteritis   Disposition/Need for in-Hospital Stay- patient unable to be discharged at this time due to --acute renal failure/AKI and electrolyte abnormalities requiring IV fluids and IV replacement -Patient From: home D/C Place: home Barriers: Not Clinically Stable-   Code Status : full  Family Communication:   NA (patient is alert, awake and coherent)  Consults  :  na  DVT Prophylaxis  :    - Heparin - SCDs   Lab Results  Component Value Date   PLT 165 02/13/2020    Inpatient Medications  Scheduled Meds: . allopurinol  300 mg Oral Daily  . vitamin C  1,000 mg Oral Daily  . aspirin EC  81 mg Oral Daily  . DULoxetine  60 mg Oral Daily  . Fluticasone-Umeclidin-Vilant  1 puff Inhalation Daily  . heparin  5,000 Units Subcutaneous Q8H  . metoprolol tartrate  25 mg Oral BID  . potassium chloride  40 mEq Oral Q3H  . simvastatin  40 mg Oral Daily  . sodium  chloride flush  3 mL Intravenous Q12H  . tamsulosin  0.8 mg Oral Daily  . cholecalciferol  1,000 Units Oral Daily   Continuous Infusions: . sodium chloride    . sodium chloride     PRN Meds:.sodium chloride, bisacodyl, cyclobenzaprine, diazepam, ondansetron **OR** ondansetron (ZOFRAN) IV, sodium chloride flush    Anti-infectives (From admission, onward)   None        Objective:   Vitals:   02/13/20 0730 02/13/20 0845 02/13/20 0915 02/13/20 1015  BP: 101/71 119/86 101/65 102/72  Pulse: (!) 101 98 97 98  Resp: (!) 31 (!) 22    Temp:      TempSrc:      SpO2: 93% 94% 94% 95%   Weight:      Height:        Wt Readings from Last 3 Encounters:  02/12/20 102.1 kg  02/09/19 98.9 kg  11/07/14 93.4 kg    No intake or output data in the 24 hours ending 02/13/20 1036   Physical Exam  Gen:- Awake Alert,  In no apparent distress  HEENT:- [place].AT, No sclera icterus Neck-Supple Neck,No JVD,.  Lungs-  CTAB , fair symmetrical air movement CV- S1, S2 normal, regular  Abd-  +ve B.Sounds, Abd Soft, generalized abdominal discomfort without rebound or guarding Extremity/Skin:- No  edema, pedal pulses present  Psych-affect is appropriate, oriented x3 Neuro-no new focal deficits, no tremors   Data Review:   Micro Results Recent Results (from the past 240 hour(s))  Respiratory Panel by RT PCR (Flu A&B, Covid) - Nasopharyngeal Swab     Status: None   Collection Time: 02/12/20 10:05 PM   Specimen: Nasopharyngeal Swab  Result Value Ref Range Status   SARS Coronavirus 2 by RT PCR NEGATIVE NEGATIVE Final    Comment: (NOTE) SARS-CoV-2 target nucleic acids are NOT DETECTED. The SARS-CoV-2 RNA is generally detectable in upper respiratoy specimens during the acute phase of infection. The lowest concentration of SARS-CoV-2 viral copies this assay can detect is 131 copies/mL. A negative result does not preclude SARS-Cov-2 infection and should not be used as the sole basis for treatment or other patient management decisions. A negative result may occur with  improper specimen collection/handling, submission of specimen other than nasopharyngeal swab, presence of viral mutation(s) within the areas targeted by this assay, and inadequate number of viral copies (<131 copies/mL). A negative result must be combined with clinical observations, patient history, and epidemiological information. The expected result is Negative. Fact Sheet for Patients:  https://www.moore.com/ Fact Sheet for Healthcare Providers:  https://www.young.biz/ This test  is not yet ap proved or cleared by the [place] FDA and  has been authorized for detection and/or diagnosis of SARS-CoV-2 by FDA under an Emergency Use Authorization (EUA). This EUA will remain  in effect (meaning this test can be used) for the duration of the COVID-19 declaration under Section 564(b)(1) of the Act, 21 U.S.C. section 360bbb-3(b)(1), unless the authorization is terminated or revoked sooner.    Influenza A by PCR NEGATIVE NEGATIVE Final   Influenza B by PCR NEGATIVE NEGATIVE Final    Comment: (NOTE) The Xpert Xpress SARS-CoV-2/FLU/RSV assay is intended as an aid in  the diagnosis of influenza from Nasopharyngeal swab specimens and  should not be used as a sole basis for treatment. Nasal washings and  aspirates are unacceptable for Xpert Xpress SARS-CoV-2/FLU/RSV  testing. Fact Sheet for Patients: https://www.moore.com/ Fact Sheet for Healthcare Providers: https://www.young.biz/ This test is not yet approved or cleared by the  [place] [employer] and  has been authorized for detection and/or diagnosis of SARS-CoV-2 by  FDA under an TEFL teacher (EUA). This EUA will remain  in effect (meaning this test can be used) for the duration of the  Covid-19 declaration under Section 564(b)(1) of the Act, 21  U.S.C. section 360bbb-3(b)(1), unless the authorization is  terminated or revoked. Performed at [facility], [address], [place], [place] [address]     Radiology Reports CT ABDOMEN PELVIS WO CONTRAST  Result Date: 02/12/2020 CLINICAL DATA:  Dizziness, fell, diarrhea for 5 days EXAM: CT ABDOMEN AND PELVIS WITHOUT CONTRAST TECHNIQUE: Multidetector CT imaging of the abdomen and pelvis was performed following the standard protocol without IV contrast. COMPARISON:  02/04/2019 FINDINGS: Lower chest: No acute pleural or parenchymal lung disease. Hepatobiliary: Gallbladder sludge and small calcified gallstones identified  without cholecystitis. Liver is unremarkable. Pancreas: Unremarkable. No pancreatic ductal dilatation or surrounding inflammatory changes. Spleen: Normal in size without focal abnormality. Adrenals/Urinary Tract: Stable 6 mm nonobstructing right renal calculus. Left kidney is unremarkable. The ureters are grossly normal. Bladder is decompressed which limits its evaluation. The adrenals are unremarkable. Stomach/Bowel: Gas fluid levels are seen throughout the large and small bowel, with no evidence of obstruction. Findings likely reflect gastroenteritis and diarrhea. No bowel obstruction or ileus. No bowel wall thickening or inflammatory change. Normal appendix right lower quadrant. Vascular/Lymphatic: Aortic atherosclerosis. No enlarged abdominal or pelvic lymph nodes. Reproductive: Prostate is unremarkable. Other: No abdominal wall hernia or abnormality. No abdominopelvic ascites. Musculoskeletal: No acute or destructive bony lesions. Reconstructed images demonstrate no additional findings. IMPRESSION: 1. Diffuse gas fluid levels throughout the large and small bowel, consistent with gastroenteritis and diarrhea. 2. Nonobstructing 6 mm right renal calculus. 3.  Aortic Atherosclerosis (ICD10-I70.0). Electronically Signed   By: Sharlet Salina M.D.   On: 02/12/2020 21:46   CT Head Wo Contrast  Result Date: 02/12/2020 CLINICAL DATA:  Syncopal episode. EXAM: CT HEAD WITHOUT CONTRAST TECHNIQUE: Contiguous axial images were obtained from the base of the skull through the vertex without intravenous contrast. COMPARISON:  None. FINDINGS: Brain: There is mild cerebral atrophy with widening of the extra-axial spaces and ventricular dilatation. There are areas of decreased attenuation within the white matter tracts of the supratentorial brain, consistent with microvascular disease changes. Vascular: No hyperdense vessel or unexpected calcification. Skull: Normal. Negative for fracture or focal lesion. Sinuses/Orbits: No  acute finding. Other: None. IMPRESSION: No acute intracranial pathology. Electronically Signed   By: Aram Candela M.D.   On: 02/12/2020 21:24   DG Chest Port 1 View  Result Date: 02/12/2020 CLINICAL DATA:  Syncope, hypotensive EXAM: PORTABLE CHEST 1 VIEW COMPARISON:  01/30/2019 FINDINGS: The heart size and mediastinal contours are within normal limits. Both lungs are clear. The visualized skeletal structures are unremarkable. IMPRESSION: No active disease. Electronically Signed   By: Sharlet Salina M.D.   On: 02/12/2020 20:38     CBC Recent Labs  Lab 02/12/20 2026 02/13/20 0255  WBC 6.6 6.1  HGB 16.8 15.9  HCT 49.0 47.0  PLT 164 165  MCV 98.0 98.9  MCH 33.6 33.5  MCHC 34.3 33.8  RDW 13.2 13.1    Chemistries  Recent Labs  Lab 02/12/20 2027 02/13/20 0255 02/13/20 0849  NA 128* 130*  --   K 2.8* 2.8*  --   CL 91* 91*  --   CO2 22 24  --   GLUCOSE 128* 114*  --   BUN 45* 49*  --  CREATININE 4.80* 3.91*  --   CALCIUM 8.5* 8.2*  --   MG  --   --  1.8  AST 34  --   --   ALT 32  --   --   ALKPHOS 78  --   --   BILITOT 0.6  --   --    ------------------------------------------------------------------------------------------------------------------ No results for input(s): CHOL, HDL, LDLCALC, TRIG, CHOLHDL, LDLDIRECT in the last 72 hours.  Lab Results  Component Value Date   HGBA1C 5.7 (H) 02/11/2019   ------------------------------------------------------------------------------------------------------------------ No results for input(s): TSH, T4TOTAL, T3FREE, THYROIDAB in the last 72 hours.  Invalid input(s): FREET3 ------------------------------------------------------------------------------------------------------------------ No results for input(s): VITAMINB12, FOLATE, FERRITIN, TIBC, IRON, RETICCTPCT in the last 72 hours.  Coagulation profile No results for input(s): INR, PROTIME in the last 168 hours.  No results for input(s): DDIMER in the last 72  hours.  Cardiac Enzymes No results for input(s): CKMB, TROPONINI, MYOGLOBIN in the last 168 hours.  Invalid input(s): CK ------------------------------------------------------------------------------------------------------------------    Component Value Date/Time   BNP 18.0 02/12/2020 2028     Shon Hale M.D on 02/13/2020 at 10:36 AM  Go to [email or web address] - for contact info  [facility] - Office  [phone]

## 2020-02-14 ENCOUNTER — Inpatient Hospital Stay (HOSPITAL_COMMUNITY): Payer: 59

## 2020-02-14 DIAGNOSIS — A020 Salmonella enteritis: Secondary | ICD-10-CM | POA: Diagnosis present

## 2020-02-14 LAB — GASTROINTESTINAL PANEL BY PCR, STOOL (REPLACES STOOL CULTURE)

## 2020-02-14 LAB — BASIC METABOLIC PANEL
Anion gap: 11 (ref 5–15)
Anion gap: 11 (ref 5–15)
BUN: 19 mg/dL (ref 6–20)
BUN: 33 mg/dL — ABNORMAL HIGH (ref 6–20)
CO2: 26 mmol/L (ref 22–32)
CO2: 26 mmol/L (ref 22–32)
Calcium: 8.3 mg/dL — ABNORMAL LOW (ref 8.9–10.3)
Calcium: 8.4 mg/dL — ABNORMAL LOW (ref 8.9–10.3)
Chloride: 94 mmol/L — ABNORMAL LOW (ref 98–111)
Chloride: 95 mmol/L — ABNORMAL LOW (ref 98–111)
Creatinine, Ser: 1 mg/dL (ref 0.61–1.24)
Creatinine, Ser: 1.24 mg/dL (ref 0.61–1.24)
GFR calc Af Amer: >60 mL/min (ref >60)
GFR calc Af Amer: >60 mL/min (ref >60)
GFR calc non Af Amer: >60 mL/min (ref >60)
GFR calc non Af Amer: >60 mL/min (ref >60)
Glucose, Bld: 110 mg/dL — ABNORMAL HIGH (ref 70–99)
Glucose, Bld: 120 mg/dL — ABNORMAL HIGH (ref 70–99)
Potassium: 2.9 mmol/L — ABNORMAL LOW (ref 3.5–5.1)
Potassium: 3.6 mmol/L (ref 3.5–5.1)
Sodium: 131 mmol/L — ABNORMAL LOW (ref 135–145)
Sodium: 132 mmol/L — ABNORMAL LOW (ref 135–145)

## 2020-02-14 LAB — BRAIN NATRIURETIC PEPTIDE: B Natriuretic Peptide: 48 pg/mL (ref 0.0–100.0)

## 2020-02-14 LAB — D-DIMER, QUANTITATIVE: D-Dimer, Quant: 3.39 ug/mL-FEU — ABNORMAL HIGH (ref 0.00–0.50)

## 2020-02-14 LAB — TSH: TSH: 0.339 u[IU]/mL — ABNORMAL LOW (ref 0.350–4.500)

## 2020-02-14 LAB — GLUCOSE, CAPILLARY: Glucose-Capillary: 105 mg/dL — ABNORMAL HIGH (ref 70–99)

## 2020-02-14 MED ORDER — AZITHROMYCIN 500 MG PO TABS
500.0000 mg | ORAL_TABLET | Freq: Every day | ORAL | 0 refills | Status: AC
Start: 2020-02-15 — End: 2020-02-22

## 2020-02-14 MED ORDER — MAGNESIUM SULFATE 2 GM/50ML IV SOLN
2.0000 g | Freq: Once | INTRAVENOUS | Status: AC
  Administered 2020-02-14: 2 g via INTRAVENOUS
  Filled 2020-02-14: qty 50

## 2020-02-14 MED ORDER — METOPROLOL SUCCINATE ER 50 MG PO TB24: 50.0000 mg | ORAL_TABLET | Freq: Every day | ORAL | 0 refills | Status: AC

## 2020-02-14 MED ORDER — POTASSIUM CHLORIDE CRYS ER 20 MEQ PO TBCR
40.0000 meq | EXTENDED_RELEASE_TABLET | ORAL | Status: AC
  Administered 2020-02-14 (×2): 40 meq via ORAL
  Filled 2020-02-14 (×2): qty 2

## 2020-02-14 MED ORDER — CELECOXIB 200 MG PO CAPS: 200.0000 mg | ORAL_CAPSULE | Freq: Every day | ORAL | 0 refills | Status: DC

## 2020-02-14 MED ORDER — IOHEXOL 350 MG/ML SOLN
100.0000 mL | Freq: Once | INTRAVENOUS | Status: AC | PRN
  Administered 2020-02-14: 02:00:00 100 mL via INTRAVENOUS

## 2020-02-14 MED ORDER — AZITHROMYCIN 250 MG PO TABS
1000.0000 mg | ORAL_TABLET | Freq: Once | ORAL | Status: AC
  Administered 2020-02-14: 1000 mg via ORAL
  Filled 2020-02-14: qty 4

## 2020-02-14 MED ORDER — POTASSIUM CHLORIDE ER 20 MEQ PO TBCR: 40.0000 meq | EXTENDED_RELEASE_TABLET | Freq: Every day | ORAL | 0 refills | Status: DC

## 2020-02-14 NOTE — Progress Notes (Signed)
   02/14/20 1347  Provider Notification  Provider Name/Title courage Mariea Clonts, MD  Date Provider Notified 02/14/20  Time Provider Notified 1347  Notification Type Page  Notification Reason Other (Comment) (notified of GI panel results positive for salmonella per lab.)  Response Other (Comment) (awaiting response from provider)

## 2020-02-14 NOTE — Progress Notes (Signed)
   02/14/20 1202  Provider Notification  Provider Name/Title Shon Hale, MD  Date Provider Notified 02/14/20  Time Provider Notified 1202  Notification Type Page  Notification Reason Other (Comment) (lab results)  Response Other (Comment) (awaiting MD response)

## 2020-02-14 NOTE — Discharge Instructions (Signed)
1) you have a Salmonella infection in your bowels-----please take azithromycin as prescribed -Please follow-up with your primary care physician for recheck and evaluation if you continue to have fevers or you are not feeling better by Friday, 02/17/2020----antibiotics may have to be changed or adjusted at that time as the Salmonella culture results should be available by then---- 2)The 'BRAT' diet is suggested, then progress to diet as tolerated as symptoms abate. Call if bloody stools, persistent diarrhea, vomiting, fever or abdominal pain. --Brat diet includes ripe bananas, rice cereal (without milk), applesauce but NOT apples, whole-wheat toast--- avoid citrus juices including apple and orange juice --Drink lots of soup, Gatorade zero, and other low to zero sugar fluids with or without electrolytes--- also drink plenty of water  3) repeat CBC and BMP blood test with the primary care physician in about a week or so

## 2020-02-14 NOTE — Progress Notes (Signed)
Brief note from overnight coverage Nurse clls to report patient with fever nd tachycardia. Chart reviewed. 1 liter NS bolus given.  Blood cultures D dimer - elevated BNP - normal Started on rocephin for  findings with leukocytes and bacteria - no nitrates but still concerning for bacteria found in males Chest x-ray -FINDINGS: The heart size and mediastinal contours are within normal limits. Mild prominence of the central pulmonary vasculature. No large airspace consolidation or pleural effusion. The visualized skeletal structures are unremarkable.  IMPRESSION: Small pulmonary vascular congestion.   CTA PE study pending

## 2020-02-14 NOTE — Discharge Summary (Addendum)
Caleb Potter, is a 59 y.o. male  DOB Jan 05, 1961  MRN [id number].  Admission date:  02/12/2020  Admitting Physician  Roxan Hockey, MD  Discharge Date:  02/14/2020   Primary MD  Caryl Bis, MD  Recommendations for primary care physician for things to follow:   1) you have a Salmonella infection in your bowels-----please take azithromycin as prescribed -Please follow-up with your primary care physician for recheck and evaluation if you continue to have fevers or you are not feeling better by Friday, 02/17/2020----antibiotics may have to be changed or adjusted at that time as the Salmonella culture results should be available by then---- 2)The 'BRAT' diet is suggested, then progress to diet as tolerated as symptoms abate. Call if bloody stools, persistent diarrhea, vomiting, fever or abdominal pain. --Brat diet includes ripe bananas, rice cereal (without milk), applesauce but NOT apples, whole-wheat toast--- avoid citrus juices including apple and orange juice --Drink lots of soup, Gatorade zero, and other low to zero sugar fluids with or without electrolytes--- also drink plenty of water  3) repeat CBC and BMP blood test with the primary care physician in about a week or so  Admission Diagnosis  Acute renal failure (ARF) ([place]) [N17.9] AKI (acute kidney injury) ([place]) [N17.9]   Discharge Diagnosis  Acute renal failure (ARF) ([place]) [N17.9] AKI (acute kidney injury) ([place]) [N17.9]    Principal Problem:   Salmonella gastroenteritis Active Problems:   AKI (acute kidney injury) ([place])   Hypokalemia   Hypertension   Anxiety   Hyponatremia   Gastroenteritis   Syncope   Acute renal failure (ARF) ([facility])      Past Medical History:  Diagnosis Date  . Coronary artery disease   . History of hiatal hernia   . Hypertension   . Myocardial infarction ([place]) 2000   light MI    Past Surgical History:    Procedure Laterality Date  . BLADDER REPAIR  01/30/2019   Procedure: Bladder Repair;  Surgeon: Irine Seal, MD;  Location: [facility];  Service: Urology;;  . Wilmon Pali RELEASE  2010   right wrist  . CORONARY ANGIOPLASTY  2000  . CYST REMOVAL NECK  2008  . CYSTOSCOPY WITH RETROGRADE PYELOGRAM, URETEROSCOPY AND STENT PLACEMENT Bilateral 11/07/2014   Procedure: CYSTOSCOPY WITH RETROGRADE PYELOGRAM, URETEROSCOPY AND STENT PLACEMENT;  Surgeon: Alexis Frock, MD;  Location: [facility];  Service: Urology;  Laterality: Bilateral;  . HERNIA REPAIR  1994   left inguinal  . HIATAL HERNIA REPAIR  2007  . HOLMIUM LASER APPLICATION Bilateral 6/64/4034   Procedure: HOLMIUM LASER APPLICATION;  Surgeon: Alexis Frock, MD;  Location: [facility];  Service: Urology;  Laterality: Bilateral;  . LAPAROTOMY N/A 01/30/2019   Procedure: EXPLORATORY LAPAROTOMY;  Surgeon: Irine Seal, MD;  Location: [facility];  Service: Urology;  Laterality: N/A;  . right knee reconstruction  2010   right     HPI  from the history and physical done on the day of admission:    HPI: Caleb Potter is a 59  y.o. male with medical history significant of hypertension, COPD, hyperlipidemia, gout who presented to the ER with diarrhea, syncope.  Patient has been having a 1 week history of diarrhea with at least 4-5 episodes of loose stool daily.  Is also had decreased p.o. intake, nausea and intermittent vomiting.  He said some generalized abdominal discomfort but no pain.  No hematemesis, melena, hematochezia, dysuria, hematuria noted.  No fever, chills, chest pain, shortness of breath, seizures, focal deficits. Patient was found to have worsening renal function as noted below.  CT showed evidence of gastroenteritis.  Noted to be hypotensive on initial arrival.  Good response to IV fluid resuscitation. ED Course:  Vital Signs reviewed on presentation, significant for temperature 98.3, HR 101, BP 86/61, saturation 99% on room air.  Labs reviewed,  significant for sodium 128, potassium 2.8, Cl 91, BUN 45, Creat 4.8, LFTs wnl, BNP 18, troponin 9, lactic acid 1.9, WBC 6.6, Hb 16.8, Hct 49, platelets 164, alcohol negative,  Imaging personally Reviewed, CT head shows no acute intracranial pathology, CT abdomen shows diffuse gas fluid levels throughout large and small bowel, consistent with gastroenteritis and diarrhea. Chest Xray shows no acute cardiopulmonary disease.  EKG personally reviewed, shows sinus tachycardia.   Hospital Course:   Brief Summary:- 59 year old male with past medical history relevant for HTN, COPD, HLD and gout who presents to the ED with near syncopal episode after having diarrhea for at least a week along with nausea and poor oral intake -Admitted on 02/12/2020 with dehydration and creatinine above 4 with a baseline around 1  A/p 1)AKI----acute kidney injury due to dehydration in the setting of persistent diarrhea and poor oral intake-compounded by lisinopril and Celebrex use  creatinine on admission=4.8  , baseline creatinine =  1.0  , creatinine is now=1.0  ,  --renally adjust medications, avoid nephrotoxic agents / dehydration  / hypotension -Renal function normalized with hydration  2)FEN--hyponatremia/hypokalemia--due to GI losses,  -Replaced and rechecked, prescription for KCl 40 mEq daily for the next 5 days given  3)HTN--patient presented with near syncope in the setting of dehydration and low blood pressure with AKI -Resolved, okay to resume metoprolol  4) near syncope--- suspect due to orthostatic BP collapse in a dehydrated patient with hypotension--- Echo with EF of 55 to 60% without wall motion abnormalities or diastolic dysfunction -Troponins and EKG unremarkable -CT head unremarkable  5)COPD--stable, no acute exacerbation, continue bronchodilators  6)HLD-stable, continue simvastatin and aspirin  7)BPH--stable, continue Flomax,   8)  Salmonella gastroenteritis with persistent diarrhea---  for over a week, - CT abdomen and pelvis suggestive of gastroenteritis -I called and discussed this case with on-call ID physician Dr. Daiva Eves--- recommends azithromycin 1 g p.o. x1 now and then 500 mg daily for the next week -Outpatient follow-up as advised in discharge instructions   Disposition-discharge home  Code Status : full   Discharge Condition: Stable  Follow UP  Follow-up Information    [name], MD. Schedule an appointment as soon as possible for a visit in 1 week(s).   Specialty: Family Medicine Why: Repeat CBC and BMP blood test in about a week or so Contact information: [address] [place] [address] [phone]            Consults obtained -infectious disease phone consult with Dr. Algis Liming  Diet and Activity recommendation:  As advised  Discharge Instructions    Discharge Instructions    Call MD for:  difficulty breathing, headache or visual disturbances  Complete by: As directed    Call MD for:  persistant dizziness or light-headedness   Complete by: As directed    Call MD for:  persistant nausea and vomiting   Complete by: As directed    Call MD for:  severe uncontrolled pain   Complete by: As directed    Call MD for:  temperature >100.4   Complete by: As directed    Diet - low sodium heart healthy   Complete by: As directed    Discharge instructions   Complete by: As directed    1) you have a Salmonella infection in your bowels-----please take azithromycin as prescribed -Please follow-up with your primary care physician for recheck and evaluation if you continue to have fevers or you are not feeling better by Friday, 02/17/2020----antibiotics may have to be changed or adjusted at that time as the Salmonella culture results should be available by then---- 2)The 'BRAT' diet is suggested, then progress to diet as tolerated as symptoms abate. Call if bloody stools, persistent diarrhea, vomiting, fever or abdominal pain. --Brat diet includes  ripe bananas, rice cereal (without milk), applesauce but NOT apples, whole-wheat toast--- avoid citrus juices including apple and orange juice --Drink lots of soup, Gatorade zero, and other low to zero sugar fluids with or without electrolytes--- also drink plenty of water  3) repeat CBC and BMP blood test with the primary care physician in about a week or so   Increase activity slowly   Complete by: As directed         Discharge Medications     Allergies as of 02/14/2020      Reactions   Citalopram Other (See Comments)   Grind teeth. "high power cocaine"       Medication List    TAKE these medications   allopurinol 300 MG tablet Commonly known as: ZYLOPRIM Take 300 mg by mouth daily.   aspirin EC 81 MG tablet Take 81 mg by mouth daily.   azithromycin 500 MG tablet Commonly known as: ZITHROMAX Take 1 tablet (500 mg total) by mouth daily for 7 days. For Salmonella infection Start taking on: February 15, 2020   celecoxib 200 MG capsule Commonly known as: CELEBREX Take 1 capsule (200 mg total) by mouth daily with breakfast. What changed: when to take this   cholecalciferol 1000 units tablet Commonly known as: VITAMIN D Take 1,000 Units by mouth daily.   cyclobenzaprine 10 MG tablet Commonly known as: FLEXERIL Take 10 mg by mouth 3 (three) times daily as needed for muscle spasms.   diazepam 10 MG tablet Commonly known as: VALIUM Take 10 mg by mouth daily. *May take one tablet three times daily as needed for anxiety   DULoxetine 60 MG capsule Commonly known as: CYMBALTA Take 60 mg by mouth daily. For pain and depression   lisinopril 20 MG tablet Commonly known as: ZESTRIL Take 20 mg by mouth daily. For high blood pressure   metoprolol succinate 50 MG 24 hr tablet Commonly known as: TOPROL-XL Take 1 tablet (50 mg total) by mouth daily. Take with or immediately following a meal. Start taking on: February 15, 2020   omeprazole 20 MG capsule Commonly known as:  PRILOSEC Take 20 mg by mouth daily. For acid reflux   polyethylene glycol 17 g packet Commonly known as: MiraLax Take 17 g by mouth daily. What changed:   when to take this  reasons to take this   Potassium Chloride ER 20 MEQ Tbcr Take 40 mEq  by mouth daily for 5 days. 1 tab daily by mouth   simvastatin 40 MG tablet Commonly known as: ZOCOR Take 40 mg by mouth daily. For high cholesterol   tamsulosin 0.4 MG Caps capsule Commonly known as: FLOMAX Take 2 capsules (0.8 mg total) by mouth daily.   Trelegy Ellipta 100-62.5-25 MCG/INH Aepb Generic drug: Fluticasone-Umeclidin-Vilant Inhale 1 puff into the lungs daily. For COPD   Tylenol 8 Hour Arthritis Pain 650 MG CR tablet Generic drug: acetaminophen Take 1,300 mg by mouth daily.   vitamin C 1000 MG tablet Take 1,000 mg by mouth daily.       Major procedures and Radiology Reports - PLEASE review detailed and final reports for all details, in brief -   CT ABDOMEN PELVIS WO CONTRAST  Result Date: 02/12/2020 CLINICAL DATA:  Dizziness, fell, diarrhea for 5 days EXAM: CT ABDOMEN AND PELVIS WITHOUT CONTRAST TECHNIQUE: Multidetector CT imaging of the abdomen and pelvis was performed following the standard protocol without IV contrast. COMPARISON:  02/04/2019 FINDINGS: Lower chest: No acute pleural or parenchymal lung disease. Hepatobiliary: Gallbladder sludge and small calcified gallstones identified without cholecystitis. Liver is unremarkable. Pancreas: Unremarkable. No pancreatic ductal dilatation or surrounding inflammatory changes. Spleen: Normal in size without focal abnormality. Adrenals/Urinary Tract: Stable 6 mm nonobstructing right renal calculus. Left kidney is unremarkable. The ureters are grossly normal. Bladder is decompressed which limits its evaluation. The adrenals are unremarkable. Stomach/Bowel: Gas fluid levels are seen throughout the large and small bowel, with no evidence of obstruction. Findings likely reflect  gastroenteritis and diarrhea. No bowel obstruction or ileus. No bowel wall thickening or inflammatory change. Normal appendix right lower quadrant. Vascular/Lymphatic: Aortic atherosclerosis. No enlarged abdominal or pelvic lymph nodes. Reproductive: Prostate is unremarkable. Other: No abdominal wall hernia or abnormality. No abdominopelvic ascites. Musculoskeletal: No acute or destructive bony lesions. Reconstructed images demonstrate no additional findings. IMPRESSION: 1. Diffuse gas fluid levels throughout the large and small bowel, consistent with gastroenteritis and diarrhea. 2. Nonobstructing 6 mm right renal calculus. 3.  Aortic Atherosclerosis (ICD10-I70.0). Electronically Signed   By: Sharlet Salina M.D.   On: 02/12/2020 21:46   CT Head Wo Contrast  Result Date: 02/12/2020 CLINICAL DATA:  Syncopal episode. EXAM: CT HEAD WITHOUT CONTRAST TECHNIQUE: Contiguous axial images were obtained from the base of the skull through the vertex without intravenous contrast. COMPARISON:  None. FINDINGS: Brain: There is mild cerebral atrophy with widening of the extra-axial spaces and ventricular dilatation. There are areas of decreased attenuation within the white matter tracts of the supratentorial brain, consistent with microvascular disease changes. Vascular: No hyperdense vessel or unexpected calcification. Skull: Normal. Negative for fracture or focal lesion. Sinuses/Orbits: No acute finding. Other: None. IMPRESSION: No acute intracranial pathology. Electronically Signed   By: Aram Candela M.D.   On: 02/12/2020 21:24   CT ANGIO CHEST PE W OR WO CONTRAST  Result Date: 02/14/2020 CLINICAL DATA:  Syncope and elevated D-dimer EXAM: CT ANGIOGRAPHY CHEST WITH CONTRAST TECHNIQUE: Multidetector CT imaging of the chest was performed using the standard protocol during bolus administration of intravenous contrast. Multiplanar CT image reconstructions and MIPs were obtained to evaluate the vascular anatomy. CONTRAST:   OMNIPAQUE IOHEXOL 350 MG/ML SOLN COMPARISON:  None. FINDINGS: Cardiovascular: Suboptimal opacification of the pulmonary arteries due to bolus timing, limiting evaluation beyond the lobar branches. Within that limitation, there is no central pulmonary embolus. The size of the main pulmonary artery is enlarged, measuring 3.4 cm. Heart size is normal, with no pericardial effusion. The  course and caliber of the aorta are normal. There is no atherosclerotic calcification. Opacification decreased due to pulmonary arterial phase contrast bolus timing. Mediastinum/Nodes: No mediastinal, hilar or axillary lymphadenopathy. Normal visualized thyroid. Thoracic esophageal course is normal. Lungs/Pleura: Airways are patent. No pleural effusion, lobar consolidation, pneumothorax or pulmonary infarction. Upper Abdomen: Contrast bolus timing is not optimized for evaluation of the abdominal organs. The visualized portions of the organs of the upper abdomen are normal. Musculoskeletal: No chest wall abnormality. No bony spinal canal stenosis. Review of the MIP images confirms the above findings. IMPRESSION: 1. Suboptimal opacification of the pulmonary arteries due to bolus timing, limiting evaluation beyond the lobar branches. Within that limitation, there is no central pulmonary embolus. 2. Enlarged main pulmonary artery, consistent with pulmonary hypertension. Electronically Signed   By: Deatra Robinson M.D.   On: 02/14/2020 01:57   DG Chest Port 1 View  Result Date: 02/14/2020 CLINICAL DATA:  Tachycardia EXAM: PORTABLE CHEST 1 VIEW COMPARISON:  February 12, 2020 FINDINGS: The heart size and mediastinal contours are within normal limits. Mild prominence of the central pulmonary vasculature. No large airspace consolidation or pleural effusion. The visualized skeletal structures are unremarkable. IMPRESSION: Small pulmonary vascular congestion. Electronically Signed   By: Jonna Clark M.D.   On: 02/14/2020 00:41   DG Chest  Port 1 View  Result Date: 02/12/2020 CLINICAL DATA:  Syncope, hypotensive EXAM: PORTABLE CHEST 1 VIEW COMPARISON:  01/30/2019 FINDINGS: The heart size and mediastinal contours are within normal limits. Both lungs are clear. The visualized skeletal structures are unremarkable. IMPRESSION: No active disease. Electronically Signed   By: Sharlet Salina M.D.   On: 02/12/2020 20:38   ECHOCARDIOGRAM COMPLETE  Result Date: 02/13/2020    ECHOCARDIOGRAM REPORT   Patient Name:   Caleb Potter Date of Exam: 02/13/2020 Medical Rec #:  [id number]       Height:       66.0 in Accession #:    [id number]      Weight:       225.0 lb Date of Birth:  [birth year]       BSA:          2.102 m Patient Age:    58 years        BP:           101/71 mmHg Patient Gender: M               HR:           99 bpm. [facility] Procedure: 2D Echo Indications:    Syncope 780.2 / R55  History:        Patient has no prior history of Echocardiogram examinations. CAD                 and Previous Myocardial Infarction, Signs/Symptoms:Syncope; Risk                 Factors:Hypertension and Current Smoker. Acute Renal Failure.  Sonographer:    Jeryl Columbia RDCS (AE) Referring Phys: [id number] [facility] M GADHIA IMPRESSIONS  1. Left ventricular ejection fraction, by estimation, is 55 to 60%. The left ventricle has normal function. The left ventricle has no regional wall motion abnormalities. Left ventricular diastolic parameters were normal.  2. Right ventricular systolic function is normal. The right ventricular size is normal.  3. The mitral valve is normal in structure. No evidence of mitral valve regurgitation. No evidence of mitral stenosis.  4. The aortic valve is normal  in structure. Aortic valve regurgitation is not visualized.  5. The inferior vena cava IVC is small, suggesting low RA pressure and hypovolemia. FINDINGS  Left Ventricle: Left ventricular ejection fraction, by estimation, is 55 to 60%. The left ventricle has normal  function. The left ventricle has no regional wall motion abnormalities. The left ventricular internal cavity size was normal in size. There is  no left ventricular hypertrophy. Left ventricular diastolic parameters were normal. Right Ventricle: The right ventricular size is normal. No increase in right ventricular wall thickness. Right ventricular systolic function is normal. Left Atrium: Left atrial size was normal in size. Right Atrium: Right atrial size was normal in size. Pericardium: There is no evidence of pericardial effusion. Mitral Valve: The mitral valve is normal in structure. No evidence of mitral valve regurgitation. No evidence of mitral valve stenosis. Tricuspid Valve: The tricuspid valve is normal in structure. Tricuspid valve regurgitation is not demonstrated. No evidence of tricuspid stenosis. Aortic Valve: The aortic valve is normal in structure. Aortic valve regurgitation is not visualized. Aortic valve mean gradient measures 3.6 mmHg. Aortic valve peak gradient measures 6.9 mmHg. Aortic valve area, by VTI measures 2.86 cm. Pulmonic Valve: The pulmonic valve was not well visualized. Pulmonic valve regurgitation is not visualized. No evidence of pulmonic stenosis. Aorta: The aortic root is normal in size and structure. Venous: The inferior vena cava IVC is small, suggesting low RA pressure and hypovolemia. IAS/Shunts: No atrial level shunt detected by color flow Doppler.  LEFT VENTRICLE PLAX 2D LVIDd:         4.39 cm  Diastology LVIDs:         3.25 cm  LV e' lateral:   9.79 cm/s LV PW:         1.11 cm  LV E/e' lateral: 6.3 LV IVS:        0.79 cm  LV e' medial:    7.94 cm/s LVOT diam:     2.10 cm  LV E/e' medial:  7.7 LV SV:         49 LV SV Index:   23 LVOT Area:     3.46 cm  RIGHT VENTRICLE RV S prime:     12.30 cm/s TAPSE (M-mode): 1.8 cm LEFT ATRIUM             Index LA diam:        2.90 cm 1.38 cm/m LA Vol (A2C):   18.9 ml 8.99 ml/m LA Vol (A4C):   15.6 ml 7.42 ml/m LA Biplane Vol: 17.4  ml 8.28 ml/m  AORTIC VALVE AV Area (Vmax):    2.74 cm AV Area (Vmean):   2.81 cm AV Area (VTI):     2.86 cm AV Vmax:           131.18 cm/s AV Vmean:          90.684 cm/s AV VTI:            0.172 m AV Peak Grad:      6.9 mmHg AV Mean Grad:      3.6 mmHg LVOT Vmax:         103.69 cm/s LVOT Vmean:        73.509 cm/s LVOT VTI:          0.142 m LVOT/AV VTI ratio: 0.83  AORTA Ao Root diam: 2.80 cm MITRAL VALVE MV Area (PHT): 3.40 cm    SHUNTS MV Decel Time: 223 msec    Systemic VTI:  0.14 m MV E  velocity: 61.30 cm/s  Systemic Diam: 2.10 cm MV A velocity: 64.30 cm/s MV E/A ratio:  0.95 Dina Rich MD Electronically signed by Dina Rich MD Signature Date/Time: 02/13/2020/3:56:16 PM    Final     Micro Results   Recent Results (from the past 240 hour(s))  Respiratory Panel by RT PCR (Flu A&B, Covid) - Nasopharyngeal Swab     Status: None   Collection Time: 02/12/20 10:05 PM   Specimen: Nasopharyngeal Swab  Result Value Ref Range Status   SARS Coronavirus 2 by RT PCR NEGATIVE NEGATIVE Final    Comment: (NOTE) SARS-CoV-2 target nucleic acids are NOT DETECTED. The SARS-CoV-2 RNA is generally detectable in upper respiratoy specimens during the acute phase of infection. The lowest concentration of SARS-CoV-2 viral copies this assay can detect is 131 copies/mL. A negative result does not preclude SARS-Cov-2 infection and should not be used as the sole basis for treatment or other patient management decisions. A negative result may occur with  improper specimen collection/handling, submission of specimen other than nasopharyngeal swab, presence of viral mutation(s) within the areas targeted by this assay, and inadequate number of viral copies (<131 copies/mL). A negative result must be combined with clinical observations, patient history, and epidemiological information. The expected result is Negative. Fact Sheet for Patients:  https://www.moore.com/ Fact Sheet for  Healthcare Providers:  https://www.young.biz/ This test is not yet ap proved or cleared by the [place] FDA and  has been authorized for detection and/or diagnosis of SARS-CoV-2 by FDA under an Emergency Use Authorization (EUA). This EUA will remain  in effect (meaning this test can be used) for the duration of the COVID-19 declaration under Section 564(b)(1) of the Act, 21 U.S.C. section 360bbb-3(b)(1), unless the authorization is terminated or revoked sooner.    Influenza A by PCR NEGATIVE NEGATIVE Final   Influenza B by PCR NEGATIVE NEGATIVE Final    Comment: (NOTE) The Xpert Xpress SARS-CoV-2/FLU/RSV assay is intended as an aid in  the diagnosis of influenza from Nasopharyngeal swab specimens and  should not be used as a sole basis for treatment. Nasal washings and  aspirates are unacceptable for Xpert Xpress SARS-CoV-2/FLU/RSV  testing. Fact Sheet for Patients: https://www.moore.com/ Fact Sheet for Healthcare Providers: https://www.young.biz/ This test is not yet approved or cleared by the [place] FDA and  has been authorized for detection and/or diagnosis of SARS-CoV-2 by  FDA under an Emergency Use Authorization (EUA). This EUA will remain  in effect (meaning this test can be used) for the duration of the  Covid-19 declaration under Section 564(b)(1) of the Act, 21  U.S.C. section 360bbb-3(b)(1), unless the authorization is  terminated or revoked. Performed at [facility], [address], [place], [place] [address]   Gastrointestinal Panel by PCR , Stool     Status: Abnormal   Collection Time: 02/13/20 10:35 AM   Specimen: Stool  Result Value Ref Range Status   Campylobacter species NOT DETECTED NOT DETECTED Final   Plesimonas shigelloides NOT DETECTED NOT DETECTED Final   Salmonella species DETECTED (A) NOT DETECTED Final    Comment: RESULT CALLED TO, READ BACK BY AND VERIFIED WITH: ASHLEY ROGERS AT  1333 ON 02/14/2020 [facility].    Yersinia enterocolitica NOT DETECTED NOT DETECTED Final   Vibrio species NOT DETECTED NOT DETECTED Final   Vibrio cholerae NOT DETECTED NOT DETECTED Final   Enteroaggregative E coli (EAEC) NOT DETECTED NOT DETECTED Final   Enteropathogenic E coli (EPEC) NOT DETECTED NOT DETECTED Final   Enterotoxigenic E  coli (ETEC) NOT DETECTED NOT DETECTED Final   Shiga like toxin producing E coli (STEC) NOT DETECTED NOT DETECTED Final   Shigella/Enteroinvasive E coli (EIEC) NOT DETECTED NOT DETECTED Final   Cryptosporidium NOT DETECTED NOT DETECTED Final   Cyclospora cayetanensis NOT DETECTED NOT DETECTED Final   Entamoeba histolytica NOT DETECTED NOT DETECTED Final   Giardia lamblia NOT DETECTED NOT DETECTED Final   Adenovirus F40/41 NOT DETECTED NOT DETECTED Final   Astrovirus NOT DETECTED NOT DETECTED Final   Norovirus GI/GII NOT DETECTED NOT DETECTED Final   Rotavirus A NOT DETECTED NOT DETECTED Final   Sapovirus (I, II, IV, and V) NOT DETECTED NOT DETECTED Final    Comment: Performed at [facility] [facility], [address], [place], [place] [address]  C Difficile Quick Screen w PCR reflex     Status: None   Collection Time: 02/13/20 10:36 AM   Specimen: Stool  Result Value Ref Range Status   C Diff antigen NEGATIVE NEGATIVE Final   C Diff toxin NEGATIVE NEGATIVE Final   C Diff interpretation No C. difficile detected.  Final    Comment: Performed at [facility], [address], [place], [place] [address]  Culture, blood (routine x 2)     Status: None (Preliminary result)   Collection Time: 02/13/20 11:41 PM   Specimen: BLOOD  Result Value Ref Range Status   Specimen Description BLOOD RIGHT ANTECUBITAL  Final   Special Requests   Final    BOTTLES DRAWN AEROBIC AND ANAEROBIC Blood Culture adequate volume   Culture   Final    NO GROWTH < 12 HOURS Performed at [facility], [address] [place], [place] [address]    Report Status PENDING  Incomplete  Culture,  blood (routine x 2)     Status: None (Preliminary result)   Collection Time: 02/13/20 11:49 PM   Specimen: BLOOD RIGHT HAND  Result Value Ref Range Status   Specimen Description BLOOD RIGHT HAND  Final   Special Requests   Final    BOTTLES DRAWN AEROBIC AND ANAEROBIC Blood Culture adequate volume   Culture   Final    NO GROWTH < 12 HOURS Performed at [facility], [address] [place], [place] [address]    Report Status PENDING  Incomplete       Today   Subjective    Caleb Potter today has no new complaints -Less frequent stools, stool becoming more formed, -No chills, no vomiting   Patient has been seen and examined prior to discharge   Objective   Blood pressure 116/81, pulse (!) 107, temperature 99.9 F (37.7 C), temperature source Oral, resp. rate 20, height 5\' 6"  (1.676 m), weight 92 kg, SpO2 96 %.   Intake/Output Summary (Last 24 hours) at 02/14/2020 1639 Last data filed at 02/14/2020 1500 Gross per 24 hour  Intake 723 ml  Output --  Net 723 ml    Exam Gen:- Awake Alert, no acute distress  HEENT:- [place].AT, No sclera icterus Neck-Supple Neck,No JVD,.  Lungs-  CTAB , good air movement bilaterally  CV- S1, S2 normal, regular Abd-  +ve B.Sounds, Abd Soft, No tenderness,    Extremity/Skin:- No  edema,   good pulses Psych-affect is appropriate, oriented x3 Neuro-no new focal deficits, no tremors    Data Review   CBC w Diff:  Lab Results  Component Value Date   WBC 6.1 02/13/2020   HGB 15.9 02/13/2020   HCT 47.0 02/13/2020   PLT 165 02/13/2020  LYMPHOPCT 18 02/08/2019   MONOPCT 17 02/08/2019   EOSPCT 4 02/08/2019   BASOPCT 0 02/08/2019    CMP:  Lab Results  Component Value Date   NA 131 (L) 02/14/2020   K 2.9 (L) 02/14/2020   CL 94 (L) 02/14/2020   CO2 26 02/14/2020   BUN 19 02/14/2020   CREATININE 1.00 02/14/2020   PROT 7.1 02/12/2020   ALBUMIN 3.6 02/12/2020   BILITOT 0.6 02/12/2020   ALKPHOS 78 02/12/2020   AST 34 02/12/2020   ALT 32  02/12/2020  .   Total Discharge time is about 33 minutes  Shon Hale M.D on 02/14/2020 at 4:39 PM  Go to [email or web address] -  for contact info  [facility] - Office  [phone]

## 2020-02-14 NOTE — Progress Notes (Signed)
Discharge instructions reviewed with patient. Given AVS. Prescriptions sent to pharmacy by provider, patient verbalized understanding of instructions, to pick up prescriptions and to follow-up with PCP next week as instructed. States will call tomorrow for appointment. IV site removed, site within normal limits. Patient left floor in stable condition via w/c accompanied by nurse tech. Discharged home.

## 2020-02-19 LAB — CULTURE, BLOOD (ROUTINE X 2)
Culture: NO GROWTH
Culture: NO GROWTH
Special Requests: ADEQUATE
Special Requests: ADEQUATE

## 2020-05-05 IMAGING — CT CT PELVIS WITHOUT CONTRAST
2 of 6 series · 13 of 46 positions shown, 18 images · non-contrast
Comparison: CT from earlier in the same day.

CLINICAL DATA: Considerable free fluid within the abdomen on recent
CT, request is been made for CT cystography.

EXAM:
CT PELVIS WITHOUT CONTRAST
TECHNIQUE: Multidetector CT imaging of the pelvis was performed following the
standard protocol without intravenous contrast.

[Series 3: abd/ pelvis 5.0 i30f 2 · axial · 0.82mm/px · z∈[+818,+1078]mm · 10 of 61 slices shown, 15 images]
[im 5/61  soft-tissue]
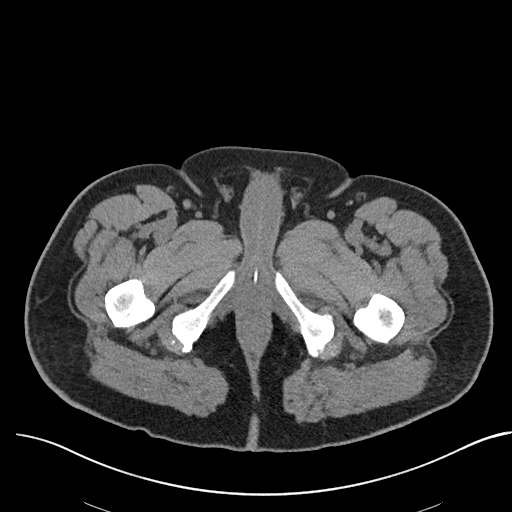
[im 5/61  bone]
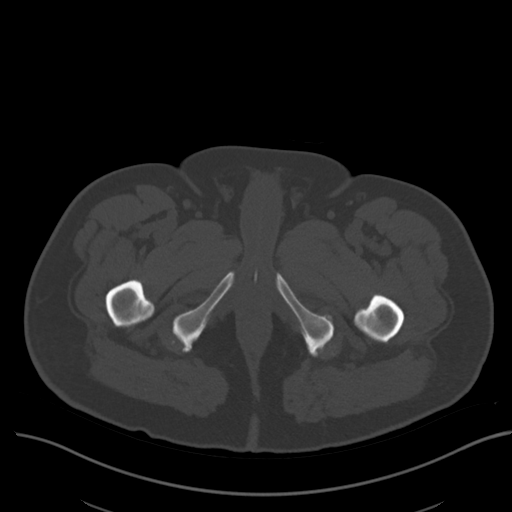
[im 13/61  soft-tissue]
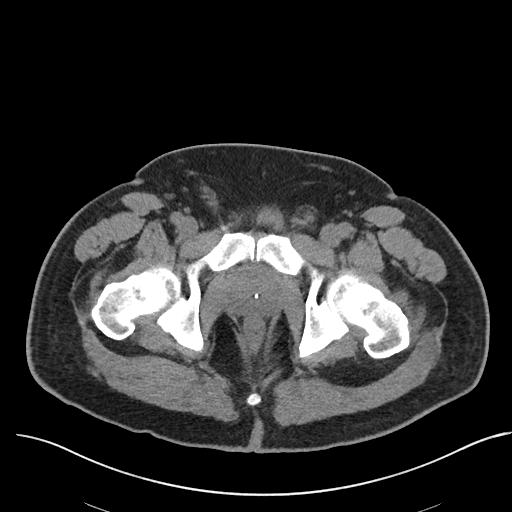
[im 17/61  soft-tissue]
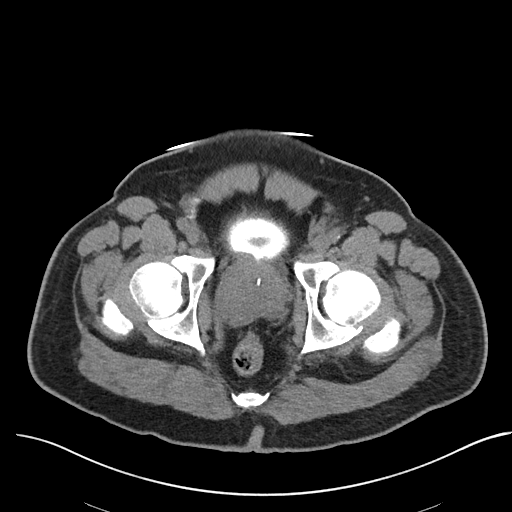
[im 25/61  soft-tissue]
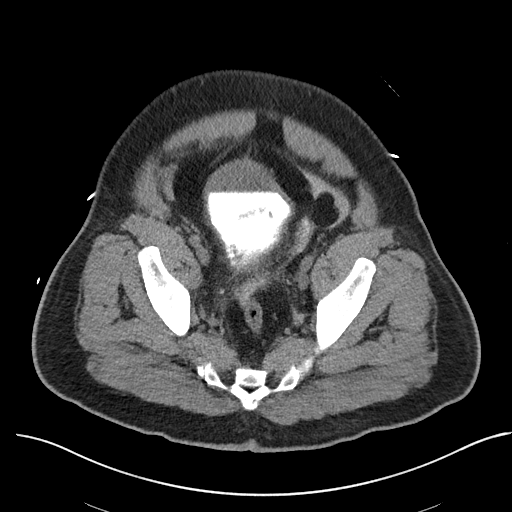
[im 33/61  soft-tissue]
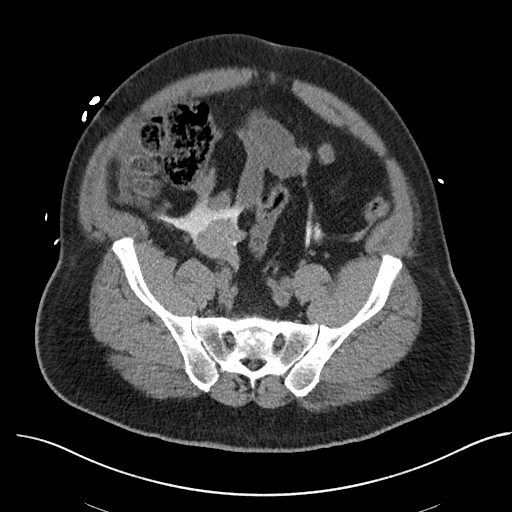
[im 37/61  soft-tissue]
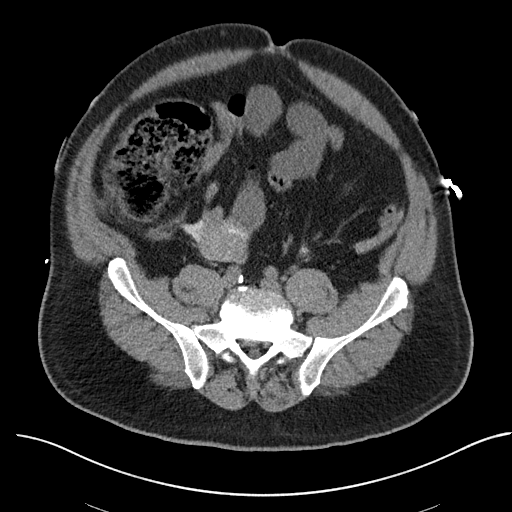
[im 45/61  soft-tissue]
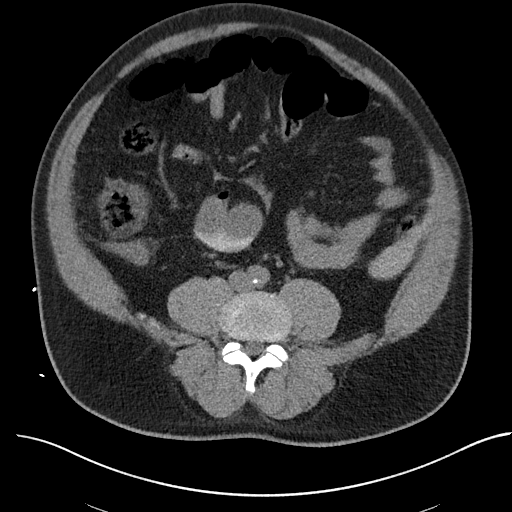
[im 45/61  lung]
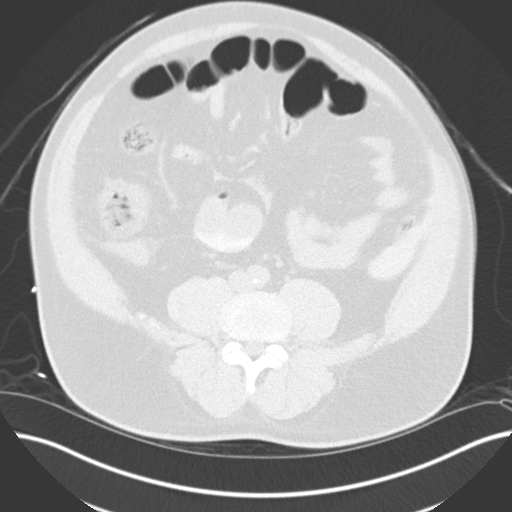
[im 49/61  soft-tissue]
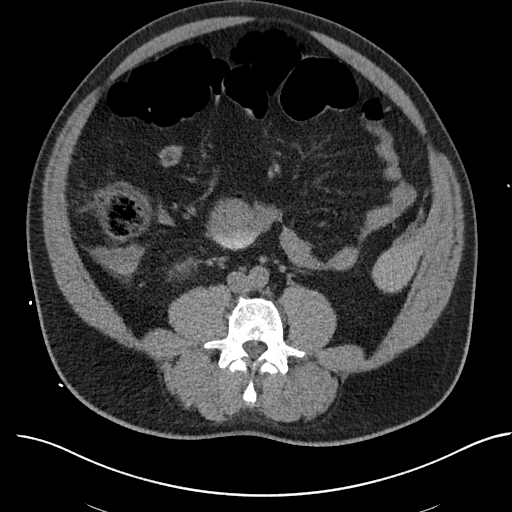
[im 49/61  lung]
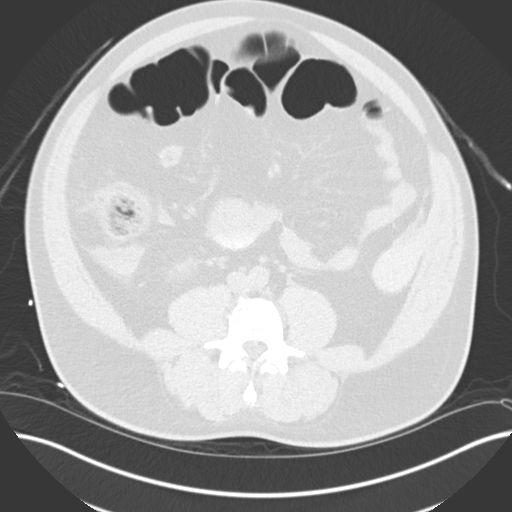
[im 53/61  lung]
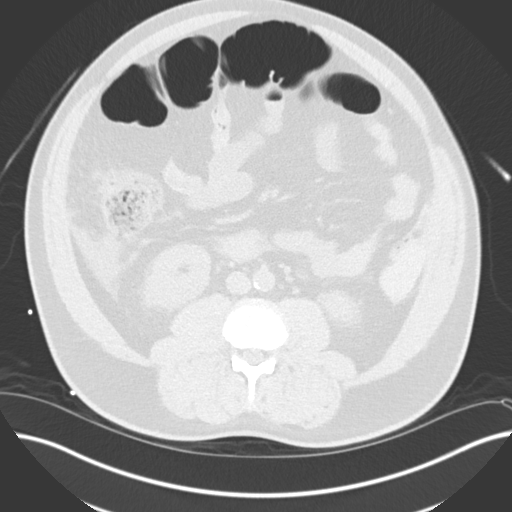
[im 57/61  soft-tissue]
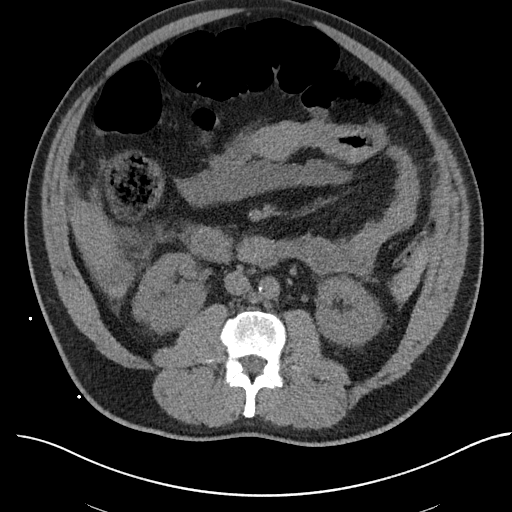
[im 57/61  lung]
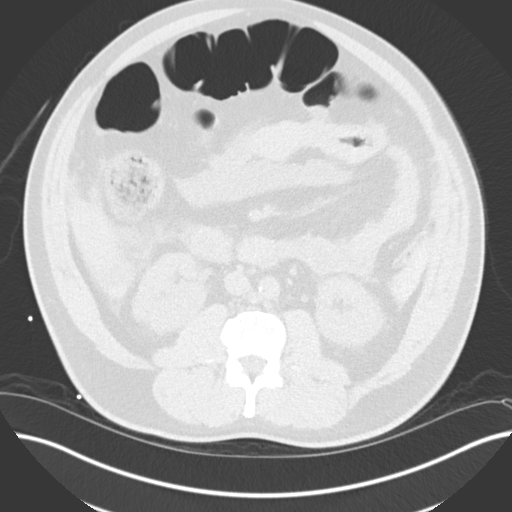
[im 57/61  bone]
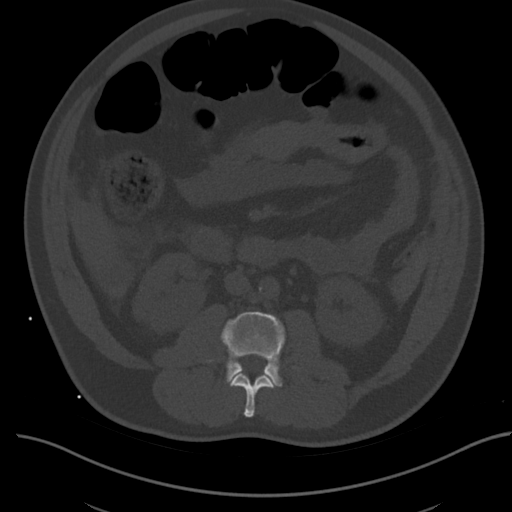

[Series 9: cor st · coronal · 0.65mm/px · 3 of 118 slices shown]
[im 30/118  soft-tissue]
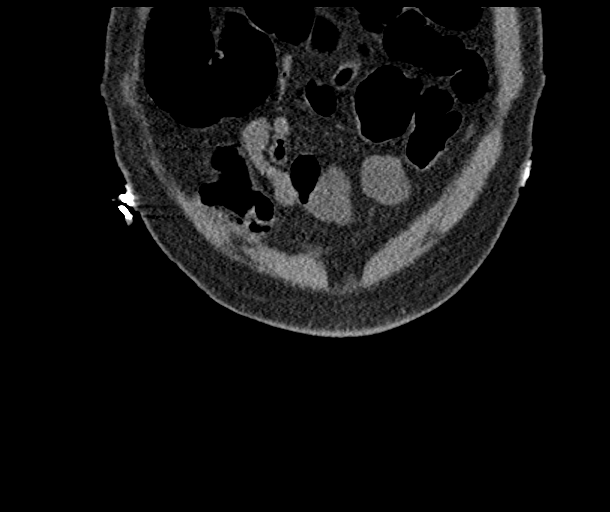
[im 59/118  soft-tissue]
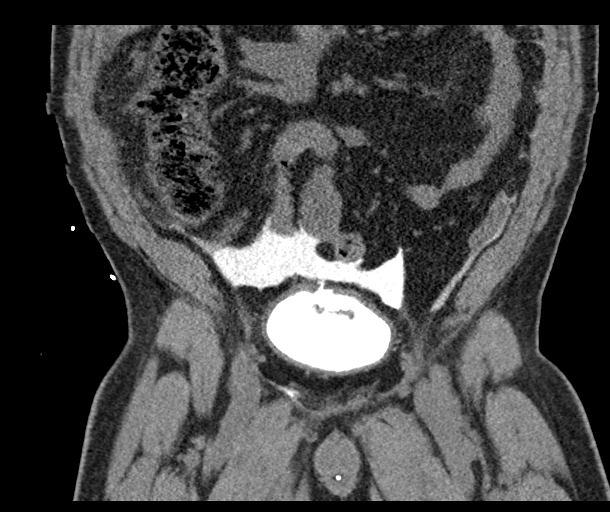
[im 88/118  soft-tissue]
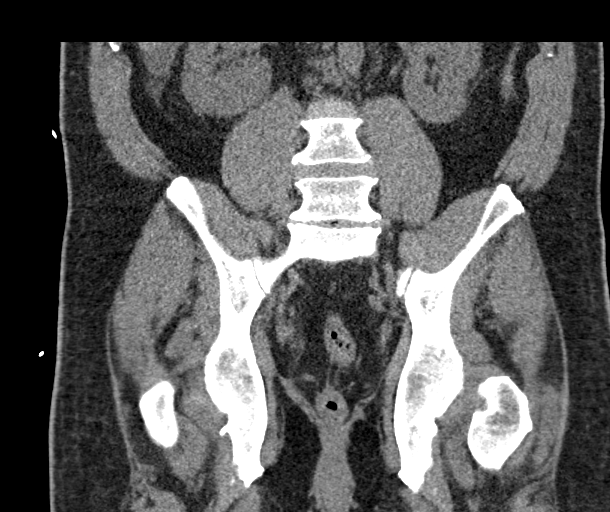

[13 of 46 positions shown; findings below may reference images not displayed]

FINDINGS: Urinary Tract: Contrast material is been instilled through the Foley
catheter. The Foley catheter balloon is well visualized. The bladder
distends well and demonstrates diffuse intraluminal filling defect
consistent with residual thrombus. Additionally there is
extravasation of contrast material from the dome of the bladder in a
linear fashion which extends for approximately 2.9 cm consistent
with bladder rupture. Contrast enhancement of the intraperitoneal
fluid seen on the recent CT examination consistent with the
extravasation.

Bowel:  Bowel is again within normal limits.

Vascular/Lymphatic: Atherosclerotic calcifications are seen. No
lymphadenopathy is noted.

Reproductive: Prostate is mildly prominent but stable from the
recent exam.

Other: Free fluid is again noted within the abdomen and pelvis
related to the bladder perforation.

Musculoskeletal: Stable in appearance when compare with the prior
CT.
IMPRESSION: CT cystography reveals a long segment of bladder discontinuity along
the dome consistent with rupture. Passage of contrast material into
the peritoneal cavity is seen.

Retained thrombus within the bladder is noted.

## 2020-06-13 ENCOUNTER — Other Ambulatory Visit: Payer: Self-pay

## 2020-06-13 ENCOUNTER — Ambulatory Visit (HOSPITAL_COMMUNITY)
Admission: RE | Admit: 2020-06-13 | Discharge: 2020-06-13 | Disposition: A | Discharge status: Home / Self Care | Payer: 59 | Source: Ambulatory Visit | Attending: Family Medicine | Admitting: Family Medicine

## 2020-06-13 DIAGNOSIS — F17210 Nicotine dependence, cigarettes, uncomplicated: Secondary | ICD-10-CM | POA: Insufficient documentation

## 2020-06-27 ENCOUNTER — Encounter: Payer: Self-pay | Admitting: Internal Medicine

## 2020-06-29 ENCOUNTER — Ambulatory Visit: Payer: 59 | Admitting: Gastroenterology

## 2020-08-15 ENCOUNTER — Ambulatory Visit (INDEPENDENT_AMBULATORY_CARE_PROVIDER_SITE_OTHER): Payer: 59 | Admitting: Gastroenterology

## 2020-08-15 ENCOUNTER — Encounter: Payer: Self-pay | Admitting: Gastroenterology

## 2020-08-15 ENCOUNTER — Other Ambulatory Visit: Payer: Self-pay

## 2020-08-15 DIAGNOSIS — Z9889 Other specified postprocedural states: Secondary | ICD-10-CM

## 2020-08-15 DIAGNOSIS — R140 Abdominal distension (gaseous): Secondary | ICD-10-CM | POA: Diagnosis not present

## 2020-08-15 DIAGNOSIS — K219 Gastro-esophageal reflux disease without esophagitis: Secondary | ICD-10-CM | POA: Diagnosis not present

## 2020-08-15 DIAGNOSIS — R933 Abnormal findings on diagnostic imaging of other parts of digestive tract: Secondary | ICD-10-CM | POA: Diagnosis not present

## 2020-08-15 DIAGNOSIS — R1319 Other dysphagia: Secondary | ICD-10-CM | POA: Diagnosis not present

## 2020-08-15 MED ORDER — OMEPRAZOLE 20 MG PO CPDR: 20.0000 mg | DELAYED_RELEASE_CAPSULE | Freq: Two times a day (BID) | ORAL | 3 refills | Status: AC

## 2020-08-15 NOTE — Progress Notes (Signed)
Primary Care Physician:  [name], MD  Primary Gastroenterologist:  Hennie Duos. Marletta Lor, DO   Chief Complaint  Patient presents with  . abdominal distension    reports going on for a while. no nausea, no vomiting, no diarrhea, no constipation    HPI:  Caleb Potter is a 59 y.o. male here for further evaluation of abdominal distention at the request of Dr. Reuel Boom.  For years, postprandial abdominal bloating. Doesn't eat that much at one time.  Does not eat as much as the guys at work.  Has to take leftovers home.  Stomach gets tight after meals.  Denies abdominal pain.  He reports having antireflux surgery in the early 2000s with Dr. Gabriel Cirri.  Never stopped omeprazole.  Has been on it every morning since then.  Still gets some reflux on occasion.  He has noted some vague solid food dysphagia lately to meat.  No nausea or vomiting.  BM regular most of the time. Rarely skips couple of days, will take Miralax on occasion. No melena, brbpr.   Completed CT chest for lung cancer screening without contrast August 2021.  On that study there was some apparent soft tissue fullness along the gastric cardia possibly underdistention versus gastritis or other gastric pathology.  Upper endoscopy recommended.  Cologuard negative two years ago per patient.    CT abdomen pelvis without contrast April 2021: Gallbladder sludge with small calcified gallstones without cholecystitis.  Gas/fluid levels are seen throughout the large and small bowel but no obstruction, consistent with gastroenteritis and diarrhea, nonobstructing 6 mm right renal calculus.  Diagnosed with Salmonella at the time.  History of ruptured urinary bladder in the spring 2020 requiring repair.  Current Outpatient Medications  Medication Sig Dispense Refill  . acetaminophen (TYLENOL 8 HOUR ARTHRITIS PAIN) 650 MG CR tablet Take 1,300 mg by mouth daily.    Marland Kitchen allopurinol (ZYLOPRIM) 300 MG tablet Take 300 mg by mouth daily.    . Ascorbic  Acid (VITAMIN C) 1000 MG tablet Take 1,000 mg by mouth daily.    Marland Kitchen aspirin EC 81 MG tablet Take 81 mg by mouth daily.    . celecoxib (CELEBREX) 200 MG capsule Take 1 capsule (200 mg total) by mouth daily with breakfast. 30 capsule 0  . cholecalciferol (VITAMIN D) 1000 UNITS tablet Take 1,000 Units by mouth daily.    . cyclobenzaprine (FLEXERIL) 10 MG tablet Take 10 mg by mouth 3 (three) times daily as needed for muscle spasms.     . diazepam (VALIUM) 10 MG tablet Take 10 mg by mouth daily. *May take one tablet three times daily as needed for anxiety    . DULoxetine (CYMBALTA) 60 MG capsule Take 60 mg by mouth daily. For pain and depression    . Fluticasone-Umeclidin-Vilant (TRELEGY ELLIPTA) 100-62.5-25 MCG/INH AEPB Inhale 1 puff into the lungs daily. For COPD    . lisinopril (PRINIVIL,ZESTRIL) 20 MG tablet Take 20 mg by mouth daily. For high blood pressure    . metoprolol succinate (TOPROL-XL) 50 MG 24 hr tablet Take 1 tablet (50 mg total) by mouth daily. Take with or immediately following a meal. 30 tablet 0  . omeprazole (PRILOSEC) 20 MG capsule Take 20 mg by mouth daily.    . polyethylene glycol (MIRALAX) 17 g packet Take 17 g by mouth daily. (Patient taking differently: Take 17 g by mouth daily as needed for mild constipation or moderate constipation. ) 30 each 11  . simvastatin (ZOCOR) 40 MG tablet Take 40  mg by mouth daily. For high cholesterol    . tamsulosin (FLOMAX) 0.4 MG CAPS capsule Take 2 capsules (0.8 mg total) by mouth daily. (Patient taking differently: Take 0.4 mg by mouth daily. ) 60 capsule 11   No current facility-administered medications for this visit.    Allergies as of 08/15/2020 - Review Complete 08/15/2020  Allergen Reaction Noted  . Citalopram Other (See Comments) 11/03/2014    Past Medical History:  Diagnosis Date  . Coronary artery disease   . History of hiatal hernia   . Hyperlipidemia   . Hypertension   . Ischemic heart disease   . Myocardial infarction  (HCC) 2000   light MI  . Osteoarthritis   . Sleep apnea    unremarkable sleep test and turned in machine (insurance would not keep covering)    Past Surgical History:  Procedure Laterality Date  . BLADDER REPAIR  01/30/2019   Procedure: Bladder Repair;  Surgeon: Bjorn Pippin, MD;  Location: [facility] OR;  Service: Urology;;  . Fidela Salisbury RELEASE  2010   right wrist  . COLONOSCOPY  2008  . CORONARY ANGIOPLASTY  2000  . CYST REMOVAL NECK  2008  . CYSTOSCOPY WITH RETROGRADE PYELOGRAM, URETEROSCOPY AND STENT PLACEMENT Bilateral 11/07/2014   Procedure: CYSTOSCOPY WITH RETROGRADE PYELOGRAM, URETEROSCOPY AND STENT PLACEMENT;  Surgeon: Sebastian Ache, MD;  Location: [facility] ORS;  Service: Urology;  Laterality: Bilateral;  . HERNIA REPAIR  1994   left inguinal  . HIATAL HERNIA REPAIR  2007  . HOLMIUM LASER APPLICATION Bilateral 11/07/2014   Procedure: HOLMIUM LASER APPLICATION;  Surgeon: Sebastian Ache, MD;  Location: [facility] ORS;  Service: Urology;  Laterality: Bilateral;  . LAPAROTOMY N/A 01/30/2019   Procedure: EXPLORATORY LAPAROTOMY;  Surgeon: Bjorn Pippin, MD;  Location: [facility] OR;  Service: Urology;  Laterality: N/A;  . right knee reconstruction  2010   right    Family History  Problem Relation Age of Onset  . Diabetes Mother   . Stroke Father   . CAD Father     Social History   Socioeconomic History  . Marital status: Married    Spouse name: Not on file  . Number of children: Not on file  . Years of education: Not on file  . Highest education level: Not on file  Occupational History  . Not on file  Tobacco Use  . Smoking status: Current Every Day Smoker    Packs/day: 1.00    Years: 40.00    Pack years: 40.00    Types: Cigarettes  . Smokeless tobacco: Former Clinical biochemist  . Vaping Use: Never used  Substance and Sexual Activity  . Alcohol use: Yes    Comment: 4-5 beers per day  . Drug use: Never  . Sexual activity: Not Currently  Other Topics Concern  . Not on file  Social  History Narrative  . Not on file   Social Determinants of Health   Financial Resource Strain:   . Difficulty of Paying Living Expenses: Not on file  Food Insecurity:   . Worried About Programme researcher, broadcasting/film/video in the Last Year: Not on file  . Ran Out of Food in the Last Year: Not on file  Transportation Needs:   . Lack of Transportation (Medical): Not on file  . Lack of Transportation (Non-Medical): Not on file  Physical Activity:   . Days of Exercise per Week: Not on file  . Minutes of Exercise per Session: Not on file  Stress:   .  Feeling of Stress : Not on file  Social Connections:   . Frequency of Communication with Friends and Family: Not on file  . Frequency of Social Gatherings with Friends and Family: Not on file  . Attends Religious Services: Not on file  . Active Member of Clubs or Organizations: Not on file  . Attends Banker Meetings: Not on file  . Marital Status: Not on file  Intimate Partner Violence:   . Fear of Current or Ex-Partner: Not on file  . Emotionally Abused: Not on file  . Physically Abused: Not on file  . Sexually Abused: Not on file      ROS:  General: Negative for anorexia, weight loss, fever, chills, fatigue, weakness. Eyes: Negative for vision changes.  ENT: Negative for hoarseness  nasal congestion.see hpi CV: Negative for chest pain, angina, palpitations, dyspnea on exertion, peripheral edema.  Respiratory: Negative for dyspnea at rest, dyspnea on exertion, cough, sputum, wheezing.  GI: See history of present illness. GU:  Negative for dysuria, hematuria, urinary incontinence, urinary frequency, nocturnal urination.  MS: +joint pain Derm: Negative for rash or itching.  Neuro: Negative for weakness, abnormal sensation, seizure, frequent headaches, memory loss, confusion.  Psych: Negative for anxiety, depression, suicidal ideation, hallucinations.  Endo: Negative for unusual weight change.  Heme: Negative for bruising or  bleeding. Allergy: Negative for rash or hives.    Physical Examination:  BP 132/80   Pulse (!) 106   Temp (!) 97.5 F (36.4 C)   Ht 5\' 6"  (1.676 m)   Wt 215 lb 6.4 oz (97.7 kg)   BMI 34.77 kg/m    General: Well-nourished, well-developed in no acute distress.  Head: Normocephalic, atraumatic.   Eyes: Conjunctiva pink, no icterus. Mouth: masked Neck: Supple without thyromegaly, masses, or lymphadenopathy.  Lungs: Clear to auscultation bilaterally.  Heart: Regular rate and rhythm, no murmurs rubs or gallops.  Abdomen: Bowel sounds are normal, nontender, nondistended, no hepatosplenomegaly or masses, no abdominal bruits or    hernia , no rebound or guarding.   Rectal: not performed Extremities: No lower extremity edema. No clubbing or deformities.  Neuro: Alert and oriented x 4 , grossly normal neurologically.  Skin: Warm and dry, no rash or jaundice.   Psych: Alert and cooperative, normal mood and affect.  Labs: Lab Results  Component Value Date   CREATININE 1.00 02/14/2020   BUN 19 02/14/2020   NA 131 (L) 02/14/2020   K 2.9 (L) 02/14/2020   CL 94 (L) 02/14/2020   CO2 26 02/14/2020   Lab Results  Component Value Date   ALT 32 02/12/2020   AST 34 02/12/2020   ALKPHOS 78 02/12/2020   BILITOT 0.6 02/12/2020   Lab Results  Component Value Date   WBC 6.1 02/13/2020   HGB 15.9 02/13/2020   HCT 47.0 02/13/2020   MCV 98.9 02/13/2020   PLT 165 02/13/2020     Imaging Studies: No results found.  Impression/Plan:  Pleasant 59 year old gentleman presenting for chronic abdominal bloating, worse after meals.  Feels like his abdomen becomes tight and distended after he eats.  Complains of early satiety.  No associated weight loss.  No nausea or vomiting.  Has a history of [employer] fundoplication nearly 20 years ago, has continued omeprazole 20 mg daily since then.  Continues to have some reflux symptoms on occasions.  Recently having solid food dysphagia.  CT with abnormal  stomach as outlined above.  At least some symptoms in part due to reflux, possible  gastritis, gas trapping in the setting of fundoplication.  Symptoms do not sound biliary in nature.  Recommend EGD/ED with Dr. Marletta Lorarver. ASA III.  I have discussed the risks, alternatives, benefits with regards to but not limited to the risk of reaction to medication, bleeding, infection, perforation and the patient is agreeable to proceed. Written consent to be obtained.  We will increase omeprazole to 20 mg twice daily for now in the setting of chronic Celebrex use/refractory GERD.  Further recommendations to follow pending EGD findings.

## 2020-08-15 NOTE — Patient Instructions (Signed)
1. Increase omeprazole to 20mg  twice daily before a meal. RX sent to your pharmacy. 2. Upper endoscopy as scheduled. See separate instructions.

## 2020-10-05 ENCOUNTER — Encounter (HOSPITAL_COMMUNITY): Payer: 59

## 2020-10-05 ENCOUNTER — Other Ambulatory Visit (HOSPITAL_COMMUNITY): Payer: 59

## 2020-10-09 ENCOUNTER — Ambulatory Visit (HOSPITAL_COMMUNITY): Admission: RE | Admit: 2020-10-09 | Payer: 59 | Source: Home / Self Care

## 2020-10-09 ENCOUNTER — Encounter (HOSPITAL_COMMUNITY): Admission: RE | Payer: Self-pay | Source: Home / Self Care

## 2020-10-09 SURGERY — ESOPHAGOGASTRODUODENOSCOPY (EGD) WITH PROPOFOL
Anesthesia: Monitor Anesthesia Care

## 2020-12-24 ENCOUNTER — Ambulatory Visit: Payer: 59 | Admitting: Urology

## 2021-01-02 NOTE — Progress Notes (Signed)
Subjective: 1. History of gross hematuria   2. Renal stones      Consult requested by Dr. Donzetta Sprung  Mr. Chalk returns today at the request of Dr. Reuel Boom for hematuria.   I repaired a bladder rupture on him in 4/20.  He had ureteroscopy with laser by Dr. Berneice Heinrich in 2016.  He had an episode of hematuria in January.  He had clots and then cleared over a 24hr period.  He saw Dr. Reuel Boom and the UA still had blood.  He has no dyuria. His IPSS is 9 with some frequency and urgency with a reduced stream.  He did have a little LLQ pain with the episode.  He remains on tamsulosin and is up to 0.8mg  daily.   He continues to smoke.   CT in 4/21 demonstrated a 12mm right renal stone but no left renal stones.  ROS:  ROS  Allergies  Allergen Reactions  . Citalopram Other (See Comments)    Grind teeth. "high power cocaine"     Past Medical History:  Diagnosis Date  . Anxiety   . Arthritis   . Chronic kidney disease   . Coronary artery disease   . Depression   . GERD (gastroesophageal reflux disease)   . Gout   . History of hiatal hernia   . Hyperlipidemia   . Hypertension   . Ischemic heart disease   . Kidney stone   . Myocardial infarction (HCC) 2000   light MI  . Osteoarthritis   . Sleep apnea    unremarkable sleep test and turned in machine (insurance would not keep covering)    Past Surgical History:  Procedure Laterality Date  . BLADDER REPAIR  01/30/2019   Procedure: Bladder Repair;  Surgeon: Bjorn Pippin, MD;  Location: [facility] OR;  Service: Urology;;  . Fidela Salisbury RELEASE  2010   right wrist  . COLONOSCOPY  2008  . CORONARY ANGIOPLASTY  2000  . CYST REMOVAL NECK  2008  . CYSTOSCOPY WITH RETROGRADE PYELOGRAM, URETEROSCOPY AND STENT PLACEMENT Bilateral 11/07/2014   Procedure: CYSTOSCOPY WITH RETROGRADE PYELOGRAM, URETEROSCOPY AND STENT PLACEMENT;  Surgeon: Sebastian Ache, MD;  Location: [facility];  Service: Urology;  Laterality: Bilateral;  . HERNIA REPAIR  1994   left inguinal   . HIATAL HERNIA REPAIR  2007  . HOLMIUM LASER APPLICATION Bilateral 11/07/2014   Procedure: HOLMIUM LASER APPLICATION;  Surgeon: Sebastian Ache, MD;  Location: [facility];  Service: Urology;  Laterality: Bilateral;  . LAPAROTOMY N/A 01/30/2019   Procedure: EXPLORATORY LAPAROTOMY;  Surgeon: Bjorn Pippin, MD;  Location: [facility] OR;  Service: Urology;  Laterality: N/A;  . right knee reconstruction  2010   right    Social History   Socioeconomic History  . Marital status: Married    Spouse name: Not on file  . Number of children: 2  . Years of education: Not on file  . Highest education level: Not on file  Occupational History  . Not on file  Tobacco Use  . Smoking status: Current Every Day Smoker    Packs/day: 1.00    Years: 40.00    Pack years: 40.00    Types: Cigarettes  . Smokeless tobacco: Former Clinical biochemist  . Vaping Use: Never used  Substance and Sexual Activity  . Alcohol use: Yes    Comment: 4-5 beers per day  . Drug use: Never  . Sexual activity: Not Currently  Other Topics Concern  . Not on file  Social History Narrative  .  Not on file   Social Determinants of Health   Financial Resource Strain: Not on file  Food Insecurity: Not on file  Transportation Needs: Not on file  Physical Activity: Not on file  Stress: Not on file  Social Connections: Not on file  Intimate Partner Violence: Not on file    Family History  Problem Relation Age of Onset  . Diabetes Mother   . Stroke Father   . CAD Father     Anti-infectives: Anti-infectives (From admission, onward)   None      Current Outpatient Medications  Medication Sig Dispense Refill  . acetaminophen (TYLENOL) 650 MG CR tablet Take 1,300 mg by mouth daily.    Marland Kitchen allopurinol (ZYLOPRIM) 300 MG tablet Take 300 mg by mouth daily.    . Ascorbic Acid (VITA-C PO) Take by mouth.    . Ascorbic Acid (VITAMIN C) 1000 MG tablet Take 1,000 mg by mouth daily.    Marland Kitchen aspirin EC 81 MG tablet Take 81 mg by mouth daily.     . celecoxib (CELEBREX) 200 MG capsule Take 1 capsule (200 mg total) by mouth daily with breakfast. 30 capsule 0  . cholecalciferol (VITAMIN D) 1000 UNITS tablet Take 1,000 Units by mouth daily.    . cyclobenzaprine (FLEXERIL) 10 MG tablet Take 10 mg by mouth 3 (three) times daily as needed for muscle spasms.     . diazepam (VALIUM) 10 MG tablet Take 10 mg by mouth daily. *May take one tablet three times daily as needed for anxiety    . DULoxetine (CYMBALTA) 60 MG capsule Take 60 mg by mouth daily. For pain and depression    . Fluticasone-Umeclidin-Vilant 100-62.5-25 MCG/INH AEPB Inhale 1 puff into the lungs daily. For COPD    . lisinopril (PRINIVIL,ZESTRIL) 20 MG tablet Take 20 mg by mouth daily. For high blood pressure    . metoprolol succinate (TOPROL-XL) 50 MG 24 hr tablet Take 1 tablet (50 mg total) by mouth daily. Take with or immediately following a meal. 30 tablet 0  . omeprazole (PRILOSEC) 20 MG capsule Take 1 capsule (20 mg total) by mouth 2 (two) times daily before a meal. 60 capsule 3  . polyethylene glycol (MIRALAX) 17 g packet Take 17 g by mouth daily. (Patient taking differently: Take 17 g by mouth daily as needed for mild constipation or moderate constipation.) 30 each 11  . simvastatin (ZOCOR) 40 MG tablet Take 40 mg by mouth daily. For high cholesterol    . tamsulosin (FLOMAX) 0.4 MG CAPS capsule Take 2 capsules (0.8 mg total) by mouth daily. (Patient taking differently: Take 0.4 mg by mouth daily.) 60 capsule 11   No current facility-administered medications for this visit.     Objective: Vital signs in last 24 hours: BP 126/87   Pulse 90   Temp 98.5 F (36.9 C)   Ht 5\' 5"  (1.651 m)   Wt 208 lb (94.3 kg)   BMI 34.61 kg/m   Intake/Output from previous day: No intake/output data recorded. Intake/Output this shift: @IOTHISSHIFT @   Physical Exam Vitals reviewed.  Constitutional:      Appearance: Normal appearance.  Cardiovascular:     Rate and Rhythm: Normal  rate and regular rhythm.     Heart sounds: Normal heart sounds.  Pulmonary:     Effort: Pulmonary effort is normal. No respiratory distress.     Breath sounds: Normal breath sounds.  Abdominal:     Palpations: Abdomen is soft.     Tenderness: There  is no abdominal tenderness.     Comments: Obese.  Lower midline incision. No hernia.  Genitourinary:    Comments: Normal penis, scrotum, testes and epididymis. AP without lesions. NST without mass. Prostate 1.5+ benign. SV non-palpable. Musculoskeletal:        General: No swelling or tenderness. Normal range of motion.     Cervical back: Normal range of motion and neck supple.  Skin:    General: Skin is warm and dry.  Neurological:     General: No focal deficit present.     Mental Status: He is alert and oriented to person, place, and time.  Psychiatric:        Mood and Affect: Mood normal.        Behavior: Behavior normal.     Lab Results:  I have reviewed records from Dr. Reuel Boom.   UA is clear today.  Marland Kitchen Results for orders placed or performed in visit on 01/03/21 (from the past 24 hour(s))  Urinalysis, Routine w reflex microscopic     Status: None   Collection Time: 01/03/21 10:16 AM  Result Value Ref Range   Specific Gravity, UA 1.020 1.005 - 1.030   pH, UA 5.5 5.0 - 7.5   Color, UA Yellow Yellow   Appearance Ur Clear Clear   Leukocytes,UA Negative Negative   Protein,UA Negative Negative/Trace   Glucose, UA Negative Negative   Ketones, UA Negative Negative   RBC, UA Negative Negative   Bilirubin, UA Negative Negative   Urobilinogen, Ur 0.2 0.2 - 1.0 mg/dL   Nitrite, UA Negative Negative   Microscopic Examination Comment    Narrative   Performed at:  [address] - [facility] [address] [place], [place]  [id number] Lab Director: Chinita Pester [place], Phone:  [phone]    Studies/Results: No results found.   Assessment/Plan: Gross hematuria in a smoker with a history of stones.   I will get a CT hematuria  study and have him return for possible cystoscopy.   No orders of the defined types were placed in this encounter.    Orders Placed This Encounter  Procedures  . CT HEMATURIA WORKUP    Standing Status:   Future    Standing Expiration Date:   02/03/2021    Order Specific Question:   Reason for Exam (SYMPTOM  OR DIAGNOSIS REQUIRED)    Answer:   hematuria    Order Specific Question:   Preferred imaging location?    Answer:   [facility]    Order Specific Question:   Radiology Contrast Protocol - do NOT remove file path    Answer:   [email or web address]  . Urinalysis, Routine w reflex microscopic     Return for Next available with CT results for possible cystoscopy. .    CC: Dr. Donzetta Sprung.     Bjorn Pippin 01/03/2021 [phone]

## 2021-01-03 ENCOUNTER — Ambulatory Visit (INDEPENDENT_AMBULATORY_CARE_PROVIDER_SITE_OTHER): Payer: 59 | Admitting: Urology

## 2021-01-03 ENCOUNTER — Other Ambulatory Visit: Payer: Self-pay

## 2021-01-03 ENCOUNTER — Encounter: Payer: Self-pay | Admitting: Urology

## 2021-01-03 VITALS — BP 126/87 | HR 90 | Temp 98.5°F | Ht 65.0 in | Wt 208.0 lb

## 2021-01-03 DIAGNOSIS — Z87448 Personal history of other diseases of urinary system: Secondary | ICD-10-CM | POA: Diagnosis not present

## 2021-01-03 DIAGNOSIS — R319 Hematuria, unspecified: Secondary | ICD-10-CM

## 2021-01-03 DIAGNOSIS — N200 Calculus of kidney: Secondary | ICD-10-CM

## 2021-01-03 DIAGNOSIS — Z87898 Personal history of other specified conditions: Secondary | ICD-10-CM

## 2021-01-03 LAB — URINALYSIS, ROUTINE W REFLEX MICROSCOPIC
Bilirubin, UA: NEGATIVE
Glucose, UA: NEGATIVE
Ketones, UA: NEGATIVE
Leukocytes,UA: NEGATIVE
Nitrite, UA: NEGATIVE
Protein,UA: NEGATIVE
RBC, UA: NEGATIVE
Specific Gravity, UA: 1.02 (ref 1.005–1.030)
Urobilinogen, Ur: 0.2 mg/dL (ref 0.2–1.0)
pH, UA: 5.5 (ref 5.0–7.5)

## 2021-01-03 NOTE — Progress Notes (Signed)
Urological Symptom Review  Patient is experiencing the following symptoms: Stream starts and stops Blood in urine Injury to kidneys/bladder Weak stream Erection problems (male only)   Review of Systems  Gastrointestinal (upper)  : Indigestion/heartburn  Gastrointestinal (lower) : Negative for lower GI symptoms  Constitutional : Night Sweats Fatigue  Skin: Skin rash/lesion  Eyes: Blurred vision Double vision  Ear/Nose/Throat : Sinus problems Negative for Ear/Nose/Throat symptoms  Hematologic/Lymphatic: Easy bruising  Cardiovascular : Chest pain  Respiratory : Cough Shortness of breath  Endocrine: Excessive thirst  Musculoskeletal: Back pain Joint pain  Neurological: Headaches  Psychologic: Depression Anxiety

## 2021-01-04 NOTE — Addendum Note (Signed)
Addended by: Ferdinand Lango on: 01/04/2021 03:20 PM   Modules accepted: Orders

## 2021-01-15 ENCOUNTER — Other Ambulatory Visit: Payer: Self-pay | Admitting: Family Medicine

## 2021-01-15 ENCOUNTER — Other Ambulatory Visit (HOSPITAL_COMMUNITY): Payer: Self-pay | Admitting: Family Medicine

## 2021-01-15 DIAGNOSIS — F17210 Nicotine dependence, cigarettes, uncomplicated: Secondary | ICD-10-CM

## 2021-02-06 ENCOUNTER — Encounter (HOSPITAL_COMMUNITY): Payer: Self-pay

## 2021-02-06 ENCOUNTER — Other Ambulatory Visit: Payer: Self-pay

## 2021-02-06 ENCOUNTER — Ambulatory Visit (HOSPITAL_COMMUNITY)
Admission: RE | Admit: 2021-02-06 | Discharge: 2021-02-06 | Disposition: A | Discharge status: Home / Self Care | Payer: 59 | Source: Ambulatory Visit | Attending: Family Medicine | Admitting: Family Medicine

## 2021-02-06 DIAGNOSIS — F17210 Nicotine dependence, cigarettes, uncomplicated: Secondary | ICD-10-CM

## 2021-02-12 ENCOUNTER — Other Ambulatory Visit: Payer: Self-pay

## 2021-02-12 ENCOUNTER — Ambulatory Visit (HOSPITAL_COMMUNITY)
Admission: RE | Admit: 2021-02-12 | Discharge: 2021-02-12 | Disposition: A | Discharge status: Home / Self Care | Payer: 59 | Source: Ambulatory Visit | Attending: Urology | Admitting: Urology

## 2021-02-12 DIAGNOSIS — Z87448 Personal history of other diseases of urinary system: Secondary | ICD-10-CM | POA: Diagnosis not present

## 2021-02-12 DIAGNOSIS — Z87898 Personal history of other specified conditions: Secondary | ICD-10-CM

## 2021-02-12 LAB — POCT I-STAT CREATININE: Creatinine, Ser: 1.1 mg/dL (ref 0.61–1.24)

## 2021-02-12 MED ORDER — IOHEXOL 300 MG/ML  SOLN
125.0000 mL | Freq: Once | INTRAMUSCULAR | Status: AC | PRN
  Administered 2021-02-12: 125 mL via INTRAVENOUS

## 2021-02-13 ENCOUNTER — Telehealth: Payer: Self-pay

## 2021-02-13 NOTE — Telephone Encounter (Signed)
-----   Message from Bjorn Pippin, MD sent at 02/13/2021  4:37 PM EDT ----- He has some non-obstructing renal stones and  prostate enlargement.   We will review at f/u.

## 2021-02-13 NOTE — Telephone Encounter (Signed)
I called patient and left message with wife to return call to office.

## 2021-02-14 LAB — COLOGUARD: COLOGUARD: NEGATIVE

## 2021-02-14 NOTE — Telephone Encounter (Signed)
Patient returned call and made aware.

## 2021-02-21 ENCOUNTER — Other Ambulatory Visit: Payer: Self-pay

## 2021-02-21 ENCOUNTER — Ambulatory Visit (INDEPENDENT_AMBULATORY_CARE_PROVIDER_SITE_OTHER): Payer: 59 | Admitting: Urology

## 2021-02-21 ENCOUNTER — Encounter: Payer: Self-pay | Admitting: Urology

## 2021-02-21 VITALS — BP 139/94 | HR 97 | Wt 203.0 lb

## 2021-02-21 DIAGNOSIS — Z87898 Personal history of other specified conditions: Secondary | ICD-10-CM

## 2021-02-21 DIAGNOSIS — N401 Enlarged prostate with lower urinary tract symptoms: Secondary | ICD-10-CM

## 2021-02-21 DIAGNOSIS — Z87448 Personal history of other diseases of urinary system: Secondary | ICD-10-CM

## 2021-02-21 DIAGNOSIS — N200 Calculus of kidney: Secondary | ICD-10-CM | POA: Diagnosis not present

## 2021-02-21 DIAGNOSIS — N138 Other obstructive and reflux uropathy: Secondary | ICD-10-CM

## 2021-02-21 DIAGNOSIS — R3912 Poor urinary stream: Secondary | ICD-10-CM | POA: Diagnosis not present

## 2021-02-21 LAB — URINALYSIS, ROUTINE W REFLEX MICROSCOPIC
Bilirubin, UA: NEGATIVE
Glucose, UA: NEGATIVE
Ketones, UA: NEGATIVE
Leukocytes,UA: NEGATIVE
Nitrite, UA: NEGATIVE
Protein,UA: NEGATIVE
Specific Gravity, UA: 1.02 (ref 1.005–1.030)
Urobilinogen, Ur: 0.2 mg/dL (ref 0.2–1.0)
pH, UA: 5.5 (ref 5.0–7.5)

## 2021-02-21 LAB — MICROSCOPIC EXAMINATION
Bacteria, UA: NONE SEEN
WBC, UA: NONE SEEN /hpf (ref 0–5)

## 2021-02-21 MED ORDER — CIPROFLOXACIN HCL 500 MG PO TABS
500.0000 mg | ORAL_TABLET | Freq: Once | ORAL | Status: AC
  Administered 2021-02-21: 500 mg via ORAL

## 2021-02-21 MED ORDER — FINASTERIDE 5 MG PO TABS: 5.0000 mg | ORAL_TABLET | Freq: Every day | ORAL | 3 refills | Status: DC

## 2021-02-21 NOTE — Progress Notes (Signed)
Subjective: 1. History of gross hematuria   2. Renal stones   3. BPH with urinary obstruction   4. Weak urinary stream      02/21/21: Caleb Potter returns today in f/u for cystoscopy to complete his hematuria evaluation.  A CT hematuria study demonstrated small non-obstructing bilateral lower pole renal stones and an enlarged prostate with some bladder wall thickening.  He passed a stone on around 02/03/21 but he had no pain with the passage.  His IPSS is 18.  His UA today is unremarkable. He remains on tamsulosin 0.8mg  daily.  He is off of the finasteride since last year after recovering from the Rezum procedure done by Dr. Alvester Morin in 11/20.    01/03/21: Caleb Potter returns today at the request of Dr. Reuel Boom for hematuria.   I repaired a bladder rupture on him in 4/20.  He had ureteroscopy with laser by Dr. Berneice Heinrich in 2016.  He had an episode of hematuria in January.  He had clots and then cleared over a 24hr period.  He saw Dr. Reuel Boom and the UA still had blood.  He has no dyuria. His IPSS is 9 with some frequency and urgency with a reduced stream.  He did have a little LLQ pain with the episode.  He remains on tamsulosin and is up to 0.8mg  daily.   He continues to smoke.   CT in 4/21 demonstrated a 40mm right renal stone but no left renal stones.  ROS:  ROS  Allergies  Allergen Reactions  . Citalopram Other (See Comments)    Grind teeth. "high power cocaine"     Past Medical History:  Diagnosis Date  . Anxiety   . Arthritis   . Chronic kidney disease   . Coronary artery disease   . Depression   . GERD (gastroesophageal reflux disease)   . Gout   . History of hiatal hernia   . Hyperlipidemia   . Hypertension   . Ischemic heart disease   . Kidney stone   . Myocardial infarction (HCC) 2000   light MI  . Osteoarthritis   . Sleep apnea    unremarkable sleep test and turned in machine (insurance would not keep covering)    Past Surgical History:  Procedure Laterality Date  . BLADDER  REPAIR  01/30/2019   Procedure: Bladder Repair;  Surgeon: Bjorn Pippin, MD;  Location: [facility] OR;  Service: Urology;;  . Fidela Salisbury RELEASE  2010   right wrist  . COLONOSCOPY  2008  . CORONARY ANGIOPLASTY  2000  . CYST REMOVAL NECK  2008  . CYSTOSCOPY WITH RETROGRADE PYELOGRAM, URETEROSCOPY AND STENT PLACEMENT Bilateral 11/07/2014   Procedure: CYSTOSCOPY WITH RETROGRADE PYELOGRAM, URETEROSCOPY AND STENT PLACEMENT;  Surgeon: Sebastian Ache, MD;  Location: [facility];  Service: Urology;  Laterality: Bilateral;  . HERNIA REPAIR  1994   left inguinal  . HIATAL HERNIA REPAIR  2007  . HOLMIUM LASER APPLICATION Bilateral 11/07/2014   Procedure: HOLMIUM LASER APPLICATION;  Surgeon: Sebastian Ache, MD;  Location: [facility];  Service: Urology;  Laterality: Bilateral;  . LAPAROTOMY N/A 01/30/2019   Procedure: EXPLORATORY LAPAROTOMY;  Surgeon: Bjorn Pippin, MD;  Location: [facility] OR;  Service: Urology;  Laterality: N/A;  . right knee reconstruction  2010   right    Social History   Socioeconomic History  . Marital status: Married    Spouse name: Not on file  . Number of children: 2  . Years of education: Not on file  . Highest education level:  Not on file  Occupational History  . Not on file  Tobacco Use  . Smoking status: Current Every Day Smoker    Packs/day: 1.00    Years: 40.00    Pack years: 40.00    Types: Cigarettes  . Smokeless tobacco: Former Clinical biochemist  . Vaping Use: Never used  Substance and Sexual Activity  . Alcohol use: Yes    Comment: 4-5 beers per day  . Drug use: Never  . Sexual activity: Not Currently  Other Topics Concern  . Not on file  Social History Narrative  . Not on file   Social Determinants of Health   Financial Resource Strain: Not on file  Food Insecurity: Not on file  Transportation Needs: Not on file  Physical Activity: Not on file  Stress: Not on file  Social Connections: Not on file  Intimate Partner Violence: Not on file    Family History  Problem  Relation Age of Onset  . Diabetes Mother   . Stroke Father   . CAD Father     Anti-infectives: Anti-infectives (From admission, onward)   Start     Dose/Rate Route Frequency Ordered Stop   02/21/21 1015  CIPROFLOXACIN HCL 500 MG PO TABS        500 mg Oral  Once 02/21/21 1001 02/21/21 1001      Current Outpatient Medications  Medication Sig Dispense Refill  . acetaminophen (TYLENOL) 650 MG CR tablet Take 1,300 mg by mouth daily.    Marland Kitchen allopurinol (ZYLOPRIM) 300 MG tablet Take 300 mg by mouth daily.    . Ascorbic Acid (VITA-C PO) Take by mouth.    . Ascorbic Acid (VITAMIN C) 1000 MG tablet Take 1,000 mg by mouth daily.    Marland Kitchen aspirin EC 81 MG tablet Take 81 mg by mouth daily.    . cholecalciferol (VITAMIN D) 1000 UNITS tablet Take 1,000 Units by mouth daily.    . cyclobenzaprine (FLEXERIL) 10 MG tablet Take 10 mg by mouth 3 (three) times daily as needed for muscle spasms.     . diazepam (VALIUM) 10 MG tablet Take 10 mg by mouth daily. *May take one tablet three times daily as needed for anxiety    . DULoxetine (CYMBALTA) 60 MG capsule Take 60 mg by mouth daily. For pain and depression    . finasteride (PROSCAR) 5 MG tablet Take 1 tablet (5 mg total) by mouth daily. 90 tablet 3  . Fluticasone-Umeclidin-Vilant 100-62.5-25 MCG/INH AEPB Inhale 1 puff into the lungs daily. For COPD    . metoprolol succinate (TOPROL-XL) 50 MG 24 hr tablet Take 1 tablet (50 mg total) by mouth daily. Take with or immediately following a meal. 30 tablet 0  . omeprazole (PRILOSEC) 20 MG capsule Take 1 capsule (20 mg total) by mouth 2 (two) times daily before a meal. 60 capsule 3  . polyethylene glycol (MIRALAX) 17 g packet Take 17 g by mouth daily. (Patient taking differently: Take 17 g by mouth daily as needed for mild constipation or moderate constipation.) 30 each 11  . simvastatin (ZOCOR) 40 MG tablet Take 40 mg by mouth daily. For high cholesterol    . tamsulosin (FLOMAX) 0.4 MG CAPS capsule Take 2 capsules (0.8  mg total) by mouth daily. (Patient taking differently: Take 0.4 mg by mouth daily.) 60 capsule 11   No current facility-administered medications for this visit.     Objective: Vital signs in last 24 hours: BP (!) 139/94   Pulse 97  Wt 203 lb (92.1 kg)   BMI 33.78 kg/m   Intake/Output from previous day: No intake/output data recorded. Intake/Output this shift: @IOTHISSHIFT @   Physical Exam  Lab Results:  .   UA is clear today.  Marland Kitchen. Results for orders placed or performed in visit on 02/21/21 (from the past 24 hour(s))  Urinalysis, Routine w reflex microscopic     Status: Abnormal   Collection Time: 02/21/21  9:19 AM  Result Value Ref Range   Specific Gravity, UA 1.020 1.005 - 1.030   pH, UA 5.5 5.0 - 7.5   Color, UA Yellow Yellow   Appearance Ur Clear Clear   Leukocytes,UA Negative Negative   Protein,UA Negative Negative/Trace   Glucose, UA Negative Negative   Ketones, UA Negative Negative   RBC, UA Trace (A) Negative   Bilirubin, UA Negative Negative   Urobilinogen, Ur 0.2 0.2 - 1.0 mg/dL   Nitrite, UA Negative Negative   Microscopic Examination See below:    Narrative   Performed at:  [address] - [facility] [address], [place], [place]  [id number] Lab Director: Chinita PesterLorie South [place], Phone:  [phone]  Microscopic Examination     Status: None   Collection Time: 02/21/21  9:19 AM   Urine  Result Value Ref Range   WBC, UA None seen 0 - 5 /hpf   RBC 0-2 0 - 2 /hpf   Epithelial Cells (non renal) 0-10 0 - 10 /hpf   Bacteria, UA None seen None seen/Few   Narrative   Performed at:  [address] - [facility] [address], [place], [place]  [id number] Lab Director: Chinita PesterLorie South [place], Phone:  [phone]    Studies/Results: CT hematuria study reviewed.   Procedure: Flexible cystoscopy.  He was prepped with betadine and the urethra  Was instilled with 2% lidocaine jelly.  He was given Cipro 500mg  po.  The scope was passed.  The urethra is normal.   The  external sphincter is intact.  The prostate was about 4cm with bilobar hyperplasia with coaption and obstruction.  There were some surgical changes at the bladder neck consistent with the Rezum effect.  There were some prominent vessels noted.  The bladder wall had mild/mod trabeculation without lesions.   The UO's were normal.  There were no complications.    Assessment/Plan: Gross hematuria possible from BPH with [facility] Re[facility].  He will remain on tamsulosin and I will resume the finasteride.  He will return in 5mo with a PSA.  Renal stones.  KUB on return.    Meds ordered this encounter  Medications  . ciprofloxacin (CIPRO) tablet 500 mg  . finasteride (PROSCAR) 5 MG tablet    Sig: Take 1 tablet (5 mg total) by mouth daily.    Dispense:  90 tablet    Refill:  3     Orders Placed This Encounter  Procedures  . Microscopic Examination  . DG Abd 1 View    Standing Status:   Future    Standing Expiration Date:   02/21/2022    Order Specific Question:   Reason for Exam (SYMPTOM  OR DIAGNOSIS REQUIRED)    Answer:   renal stones.    Order Specific Question:   Preferred imaging location?    Answer:   [facility]    Order Specific Question:   Radiology Contrast Protocol - do NOT remove file path    Answer:   [email or web address]  . Urinalysis,  Routine w reflex microscopic  . PSA, total and free    Standing Status:   Future    Standing Expiration Date:   02/21/2022     Return in about 6 months (around 08/24/2021) for with PSA and KUB. .    CC: Dr. Donzetta Sprung.     Bjorn Pippin 02/21/2021 [phone]

## 2021-02-21 NOTE — Progress Notes (Signed)
Urological Symptom Review  Patient is experiencing the following symptoms: Trouble starting stream Weak stream   Review of Systems  Gastrointestinal (upper)  : Indigestion/heartburn  Gastrointestinal (lower) : Negative for lower GI symptoms  Constitutional : Negative for symptoms  Skin: Negative for skin symptoms  Eyes: Negative for eye symptoms  Ear/Nose/Throat : Sinus problems  Hematologic/Lymphatic: Negative for Hematologic/Lymphatic symptoms  Cardiovascular : Negative for cardiovascular symptoms  Respiratory : Negative for respiratory symptoms  Endocrine: Excessive thirst  Musculoskeletal: Back pain Joint pain  Neurological: Headaches  Psychologic: Negative for psychiatric symptoms

## 2021-08-15 ENCOUNTER — Other Ambulatory Visit: Payer: Self-pay

## 2021-08-15 ENCOUNTER — Other Ambulatory Visit: Payer: 59

## 2021-08-15 DIAGNOSIS — N138 Other obstructive and reflux uropathy: Secondary | ICD-10-CM

## 2021-08-15 DIAGNOSIS — N200 Calculus of kidney: Secondary | ICD-10-CM

## 2021-08-16 LAB — PSA, TOTAL AND FREE
PSA, Free Pct: 23.3 %
PSA, Free: 0.28 ng/mL
Prostate Specific Ag, Serum: 1.2 ng/mL (ref 0.0–4.0)

## 2021-08-22 ENCOUNTER — Other Ambulatory Visit: Payer: Self-pay

## 2021-08-22 ENCOUNTER — Encounter: Payer: Self-pay | Admitting: Urology

## 2021-08-22 ENCOUNTER — Ambulatory Visit (INDEPENDENT_AMBULATORY_CARE_PROVIDER_SITE_OTHER): Payer: 59 | Admitting: Urology

## 2021-08-22 VITALS — BP 122/85 | HR 106 | Temp 98.4°F

## 2021-08-22 DIAGNOSIS — N138 Other obstructive and reflux uropathy: Secondary | ICD-10-CM

## 2021-08-22 DIAGNOSIS — N200 Calculus of kidney: Secondary | ICD-10-CM | POA: Diagnosis not present

## 2021-08-22 DIAGNOSIS — Z87898 Personal history of other specified conditions: Secondary | ICD-10-CM | POA: Diagnosis not present

## 2021-08-22 DIAGNOSIS — N401 Enlarged prostate with lower urinary tract symptoms: Secondary | ICD-10-CM | POA: Diagnosis not present

## 2021-08-22 DIAGNOSIS — R3912 Poor urinary stream: Secondary | ICD-10-CM | POA: Diagnosis not present

## 2021-08-22 LAB — URINALYSIS, ROUTINE W REFLEX MICROSCOPIC
Bilirubin, UA: NEGATIVE
Glucose, UA: NEGATIVE
Ketones, UA: NEGATIVE
Leukocytes,UA: NEGATIVE
Nitrite, UA: NEGATIVE
Protein,UA: NEGATIVE
RBC, UA: NEGATIVE
Specific Gravity, UA: 1.025 (ref 1.005–1.030)
Urobilinogen, Ur: 1 mg/dL (ref 0.2–1.0)
pH, UA: 5.5 (ref 5.0–7.5)

## 2021-08-22 MED ORDER — FINASTERIDE 5 MG PO TABS: 5.0000 mg | ORAL_TABLET | Freq: Every day | ORAL | 3 refills | Status: DC

## 2021-08-22 MED ORDER — TAMSULOSIN HCL 0.4 MG PO CAPS: 0.4000 mg | ORAL_CAPSULE | Freq: Two times a day (BID) | ORAL | 3 refills | Status: DC

## 2021-08-22 NOTE — Progress Notes (Signed)
Urological Symptom Review  Patient is experiencing the following symptoms: Kidney stones   Review of Systems  Gastrointestinal (upper)  : Indigestion/heartburn  Gastrointestinal (lower) : Negative for lower GI symptoms  Constitutional : Negative for symptoms  Skin: Negative for skin symptoms  Eyes: Blurred vision  Ear/Nose/Throat : Sinus problems  Hematologic/Lymphatic: Negative for Hematologic/Lymphatic symptoms  Cardiovascular : Chest pain  Respiratory : Shortness of breath  Endocrine: Excessive thirst  Musculoskeletal: Joint pain  Neurological: Dizziness  Psychologic: Depression Anxiety

## 2021-08-22 NOTE — Progress Notes (Signed)
Subjective: 1. BPH with urinary obstruction   2. Weak urinary stream   3. History of gross hematuria   4. Renal stones     08/22/21: Caleb Potter returns for the history noted below.  He was put back on finasteride at his last visit and his symptoms have improved.  He remains on tamsulosin as well.  His PSA is 1.2.  His IPSS is 8 with nocturia x 1.  He has had no further hematuria.  He has had some mild right low back pain but no flank pain.    02/21/21: Caleb Potter returns today in f/u for cystoscopy to complete his hematuria evaluation.  A CT hematuria study demonstrated small non-obstructing bilateral lower pole renal stones and an enlarged prostate with some bladder wall thickening.  He passed a stone on around 02/03/21 but he had no pain with the passage.  His IPSS is 18.  His UA today is unremarkable. He remains on tamsulosin 0.8mg  daily.  He is off of the finasteride since last year after recovering from the Rezum procedure done by Dr. Alvester Morin in 11/20.    01/03/21: Caleb Potter returns today at the request of Dr. Reuel Boom for hematuria.   I repaired a bladder rupture on him in 4/20.  He had ureteroscopy with laser by Dr. Berneice Heinrich in 2016.  He had an episode of hematuria in January.  He had clots and then cleared over a 24hr period.  He saw Dr. Reuel Boom and the UA still had blood.  He has no dyuria. His IPSS is 9 with some frequency and urgency with a reduced stream.  He did have a little LLQ pain with the episode.  He remains on tamsulosin and is up to 0.8mg  daily.   He continues to smoke.   CT in 4/21 demonstrated a 4mm right renal stone but no left renal stones.    IPSS     Row Name 08/22/21 1000         International Prostate Symptom Score   How often have you had the sensation of not emptying your bladder? Less than half the time     How often have you had to urinate less than every two hours? Less than 1 in 5 times     How often have you found you stopped and started again several times when you  urinated? Less than half the time     How often have you found it difficult to postpone urination? Less than 1 in 5 times     How often have you had a weak urinary stream? Less than 1 in 5 times     How often have you had to strain to start urination? Not at All     How many times did you typically get up at night to urinate? 1 Time     Total IPSS Score 8       Quality of Life due to urinary symptoms   If you were to spend the rest of your life with your urinary condition just the way it is now how would you feel about that? Mostly Satisfied              ROS:  ROS  Allergies  Allergen Reactions   Citalopram Other (See Comments)    Grind teeth. "high power cocaine"     Past Medical History:  Diagnosis Date   Anxiety    Arthritis    Chronic kidney disease    Coronary artery disease  Depression    GERD (gastroesophageal reflux disease)    Gout    History of hiatal hernia    Hyperlipidemia    Hypertension    Ischemic heart disease    Kidney stone    Myocardial infarction ([place]) 2000   light MI   Osteoarthritis    Sleep apnea    unremarkable sleep test and turned in machine (insurance would not keep covering)    Past Surgical History:  Procedure Laterality Date   BLADDER REPAIR  01/30/2019   Procedure: Bladder Repair;  Surgeon: Irine Seal, MD;  Location: [facility];  Service: Urology;;   CARPAL TUNNEL RELEASE  2010   right wrist   COLONOSCOPY  2008   CORONARY ANGIOPLASTY  2000   CYST REMOVAL NECK  2008   CYSTOSCOPY WITH RETROGRADE PYELOGRAM, URETEROSCOPY AND STENT PLACEMENT Bilateral 11/07/2014   Procedure: CYSTOSCOPY WITH RETROGRADE PYELOGRAM, URETEROSCOPY AND STENT PLACEMENT;  Surgeon: Alexis Frock, MD;  [facility];  Service: Urology;  Laterality: Bilateral;   HERNIA REPAIR  1994   left inguinal   HIATAL HERNIA REPAIR  2007   HOLMIUM LASER APPLICATION Bilateral [id number]   Procedure: HOLMIUM LASER APPLICATION;  Surgeon: Alexis Frock, MD;  [facility];  Service: Urology;  Laterality: Bilateral;   LAPAROTOMY N/A 01/30/2019   Procedure: EXPLORATORY LAPAROTOMY;  Surgeon: Irine Seal, MD;  Location: [facility];  Service: Urology;  Laterality: N/A;   right knee reconstruction  2010   right    Social History   Socioeconomic History   Marital status: Married    Spouse name: Not on file   Number of children: 2   Years of education: Not on file   Highest education level: Not on file  Occupational History   Not on file  Tobacco Use   Smoking status: Every Day    Packs/day: 1.00    Years: 40.00    Pack years: 40.00    Types: Cigarettes   Smokeless tobacco: Former  Scientific laboratory technician Use: Never used  Substance and Sexual Activity   Alcohol use: Yes    Comment: 4-5 beers per day   Drug use: Never   Sexual activity: Not Currently  Other Topics Concern   Not on file  Social History Narrative   Not on file   Social Determinants of Health   Financial Resource Strain: Not on file  Food Insecurity: Not on file  Transportation Needs: Not on file  Physical Activity: Not on file  Stress: Not on file  Social Connections: Not on file  Intimate Partner Violence: Not on file    Family History  Problem Relation Age of Onset   Diabetes Mother    Stroke Father    CAD Father     Anti-infectives: Anti-infectives (From admission, onward)    None       Current Outpatient Medications  Medication Sig Dispense Refill   acetaminophen (TYLENOL) 650 MG CR tablet Take 1,300 mg by mouth daily.     allopurinol (ZYLOPRIM) 300 MG tablet Take 300 mg by mouth daily.     Ascorbic Acid (VITA-C PO) Take by mouth.     Ascorbic Acid (VITAMIN C) 1000 MG tablet Take 1,000 mg by mouth daily.     aspirin EC 81 MG tablet Take 81 mg by mouth daily.     cholecalciferol (VITAMIN D) 1000 UNITS tablet Take 1,000 Units by mouth daily.     cyclobenzaprine (FLEXERIL) 10 MG tablet Take 10 mg by mouth  3 (three) times daily as needed for muscle spasms.       diazepam (VALIUM) 10 MG tablet Take 10 mg by mouth daily. *May take one tablet three times daily as needed for anxiety     DULoxetine (CYMBALTA) 60 MG capsule Take 60 mg by mouth daily. For pain and depression     finasteride (PROSCAR) 5 MG tablet Take 1 tablet (5 mg total) by mouth daily. 90 tablet 3   Fluticasone-Umeclidin-Vilant 100-62.5-25 MCG/INH AEPB Inhale 1 puff into the lungs daily. For COPD     metoprolol succinate (TOPROL-XL) 50 MG 24 hr tablet Take 1 tablet (50 mg total) by mouth daily. Take with or immediately following a meal. 30 tablet 0   omeprazole (PRILOSEC) 20 MG capsule Take 1 capsule (20 mg total) by mouth 2 (two) times daily before a meal. 60 capsule 3   polyethylene glycol (MIRALAX) 17 g packet Take 17 g by mouth daily. (Patient taking differently: Take 17 g by mouth daily as needed for mild constipation or moderate constipation.) 30 each 11   simvastatin (ZOCOR) 40 MG tablet Take 40 mg by mouth daily. For high cholesterol     tamsulosin (FLOMAX) 0.4 MG CAPS capsule Take 1 capsule (0.4 mg total) by mouth 2 (two) times daily. 180 capsule 3   No current facility-administered medications for this visit.     Objective: Vital signs in last 24 hours: BP 122/85   Pulse (!) 106   Temp 98.4 F (36.9 C)   Intake/Output from previous day: No intake/output data recorded. Intake/Output this shift: @IOTHISSHIFT @   Physical Exam  Lab Results:  .     Lab Results  Component Value Date   PSA1 1.2 08/15/2021    Results for orders placed or performed in visit on 08/22/21 (from the past 24 hour(s))  Urinalysis, Routine w reflex microscopic     Status: None   Collection Time: 08/22/21  9:53 AM  Result Value Ref Range   Specific Gravity, UA 1.025 1.005 - 1.030   pH, UA 5.5 5.0 - 7.5   Color, UA Yellow Yellow   Appearance Ur Clear Clear   Leukocytes,UA Negative Negative   Protein,UA Negative Negative/Trace   Glucose, UA Negative Negative   Ketones, UA Negative  Negative   RBC, UA Negative Negative   Bilirubin, UA Negative Negative   Urobilinogen, Ur 1.0 0.2 - 1.0 mg/dL   Nitrite, UA Negative Negative   Microscopic Examination Comment    Narrative   Performed at:  [facility] [address], [place], [place]  [id number] Lab Director: Mina Marble MT, Phone:  [phone]     Studies/Results:    Assessment/Plan: BPH with BOO with neovascularity at the [facility] related to the prior Rezum.  He has done well with the addition of finasteride to the tamsulosin and has had no further bleeding.   I will have him return in a year with a PSA.   Renal stones.  He has passed some small stones about 6 weeks ago. KUB on return.    Meds ordered this encounter  Medications   finasteride (PROSCAR) 5 MG tablet    Sig: Take 1 tablet (5 mg total) by mouth daily.    Dispense:  90 tablet    Refill:  3   tamsulosin (FLOMAX) 0.4 MG CAPS capsule    Sig: Take 1 capsule (0.4 mg total) by mouth 2 (two) times daily.    Dispense:  180 capsule    Refill:  3      Orders Placed This Encounter  Procedures   DG Abd 1 View    Standing Status:   Future    Standing Expiration Date:   08/22/2022    Order Specific Question:   Reason for Exam (SYMPTOM  OR DIAGNOSIS REQUIRED)    Answer:   renal stone    Order Specific Question:   Preferred imaging location?    Answer:   [facility]    Order Specific Question:   Radiology Contrast Protocol - do NOT remove file path    Answer:   [email or web address]   Urinalysis, Routine w reflex microscopic   PSA, total and free    Standing Status:   Future    Standing Expiration Date:   08/22/2022     Return in about 1 year (around 08/22/2022) for with PSA and KUB. .    CC: Dr. Gar Ponto.     Irine Seal 08/22/2021 [id number] ID: Caleb Potter, male   DOB: Sep 01, 1961, 60 y.o.   MRN: [id number]

## 2021-09-25 ENCOUNTER — Other Ambulatory Visit (HOSPITAL_COMMUNITY): Payer: Self-pay | Admitting: Family Medicine

## 2021-09-25 DIAGNOSIS — F17210 Nicotine dependence, cigarettes, uncomplicated: Secondary | ICD-10-CM

## 2021-10-15 ENCOUNTER — Other Ambulatory Visit: Payer: Self-pay | Admitting: Urology

## 2021-10-15 DIAGNOSIS — N401 Enlarged prostate with lower urinary tract symptoms: Secondary | ICD-10-CM

## 2021-11-08 ENCOUNTER — Ambulatory Visit (HOSPITAL_COMMUNITY): Admission: RE | Admit: 2021-11-08 | Payer: 59 | Source: Ambulatory Visit

## 2021-11-08 ENCOUNTER — Encounter (HOSPITAL_COMMUNITY): Payer: Self-pay

## 2021-12-27 ENCOUNTER — Other Ambulatory Visit: Payer: Self-pay

## 2021-12-27 ENCOUNTER — Ambulatory Visit (HOSPITAL_COMMUNITY)
Admission: RE | Admit: 2021-12-27 | Discharge: 2021-12-27 | Disposition: A | Discharge status: Home / Self Care | Payer: 59 | Source: Ambulatory Visit | Attending: Family Medicine | Admitting: Family Medicine

## 2021-12-27 DIAGNOSIS — F17210 Nicotine dependence, cigarettes, uncomplicated: Secondary | ICD-10-CM | POA: Diagnosis present

## 2022-06-17 DIAGNOSIS — R7301 Impaired fasting glucose: Secondary | ICD-10-CM | POA: Diagnosis not present

## 2022-06-17 DIAGNOSIS — I700 Atherosclerosis of aorta: Secondary | ICD-10-CM | POA: Diagnosis not present

## 2022-06-17 DIAGNOSIS — Z6835 Body mass index (BMI) 35.0-35.9, adult: Secondary | ICD-10-CM | POA: Diagnosis not present

## 2022-06-17 DIAGNOSIS — I10 Essential (primary) hypertension: Secondary | ICD-10-CM | POA: Diagnosis not present

## 2022-06-17 DIAGNOSIS — M1712 Unilateral primary osteoarthritis, left knee: Secondary | ICD-10-CM | POA: Diagnosis not present

## 2022-06-17 DIAGNOSIS — E8881 Metabolic syndrome: Secondary | ICD-10-CM | POA: Diagnosis not present

## 2022-06-17 DIAGNOSIS — R69 Illness, unspecified: Secondary | ICD-10-CM | POA: Diagnosis not present

## 2022-06-17 DIAGNOSIS — J449 Chronic obstructive pulmonary disease, unspecified: Secondary | ICD-10-CM | POA: Diagnosis not present

## 2022-06-17 DIAGNOSIS — E7849 Other hyperlipidemia: Secondary | ICD-10-CM | POA: Diagnosis not present

## 2022-06-17 DIAGNOSIS — I2510 Atherosclerotic heart disease of native coronary artery without angina pectoris: Secondary | ICD-10-CM | POA: Diagnosis not present

## 2022-06-17 DIAGNOSIS — K2100 Gastro-esophageal reflux disease with esophagitis, without bleeding: Secondary | ICD-10-CM | POA: Diagnosis not present

## 2022-07-02 DIAGNOSIS — H11153 Pinguecula, bilateral: Secondary | ICD-10-CM | POA: Diagnosis not present

## 2022-07-02 DIAGNOSIS — H40003 Preglaucoma, unspecified, bilateral: Secondary | ICD-10-CM | POA: Diagnosis not present

## 2022-07-02 DIAGNOSIS — H25093 Other age-related incipient cataract, bilateral: Secondary | ICD-10-CM | POA: Diagnosis not present

## 2022-08-14 ENCOUNTER — Other Ambulatory Visit: Payer: 59

## 2022-08-14 DIAGNOSIS — N138 Other obstructive and reflux uropathy: Secondary | ICD-10-CM

## 2022-08-14 DIAGNOSIS — N401 Enlarged prostate with lower urinary tract symptoms: Secondary | ICD-10-CM | POA: Diagnosis not present

## 2022-08-16 LAB — PSA, TOTAL AND FREE
PSA, Free Pct: 25 %
PSA, Free: 0.25 ng/mL
Prostate Specific Ag, Serum: 1 ng/mL (ref 0.0–4.0)

## 2022-08-21 ENCOUNTER — Other Ambulatory Visit: Payer: Self-pay

## 2022-08-21 ENCOUNTER — Telehealth: Payer: Self-pay

## 2022-08-21 ENCOUNTER — Ambulatory Visit: Payer: 59 | Admitting: Urology

## 2022-08-21 ENCOUNTER — Ambulatory Visit (HOSPITAL_COMMUNITY)
Admission: RE | Admit: 2022-08-21 | Discharge: 2022-08-21 | Disposition: A | Discharge status: Home / Self Care | Payer: 59 | Source: Ambulatory Visit | Attending: Urology | Admitting: Urology

## 2022-08-21 VITALS — BP 140/85 | HR 102

## 2022-08-21 DIAGNOSIS — N200 Calculus of kidney: Secondary | ICD-10-CM | POA: Insufficient documentation

## 2022-08-21 DIAGNOSIS — R3912 Poor urinary stream: Secondary | ICD-10-CM | POA: Diagnosis not present

## 2022-08-21 DIAGNOSIS — M47816 Spondylosis without myelopathy or radiculopathy, lumbar region: Secondary | ICD-10-CM | POA: Diagnosis not present

## 2022-08-21 DIAGNOSIS — M47814 Spondylosis without myelopathy or radiculopathy, thoracic region: Secondary | ICD-10-CM | POA: Diagnosis not present

## 2022-08-21 DIAGNOSIS — R310 Gross hematuria: Secondary | ICD-10-CM

## 2022-08-21 DIAGNOSIS — N401 Enlarged prostate with lower urinary tract symptoms: Secondary | ICD-10-CM

## 2022-08-21 DIAGNOSIS — N138 Other obstructive and reflux uropathy: Secondary | ICD-10-CM

## 2022-08-21 DIAGNOSIS — Z87898 Personal history of other specified conditions: Secondary | ICD-10-CM

## 2022-08-21 DIAGNOSIS — R3913 Splitting of urinary stream: Secondary | ICD-10-CM

## 2022-08-21 DIAGNOSIS — I878 Other specified disorders of veins: Secondary | ICD-10-CM | POA: Diagnosis not present

## 2022-08-21 LAB — URINALYSIS, ROUTINE W REFLEX MICROSCOPIC
Bilirubin, UA: NEGATIVE
Glucose, UA: NEGATIVE
Ketones, UA: NEGATIVE
Leukocytes,UA: NEGATIVE
Nitrite, UA: NEGATIVE
Protein,UA: NEGATIVE
RBC, UA: NEGATIVE
Specific Gravity, UA: 1.025 (ref 1.005–1.030)
Urobilinogen, Ur: 0.2 mg/dL (ref 0.2–1.0)
pH, UA: 5 (ref 5.0–7.5)

## 2022-08-21 MED ORDER — TAMSULOSIN HCL 0.4 MG PO CAPS: 0.4000 mg | ORAL_CAPSULE | Freq: Two times a day (BID) | ORAL | 3 refills | Status: DC

## 2022-08-21 MED ORDER — FINASTERIDE 5 MG PO TABS: ORAL_TABLET | ORAL | 3 refills | Status: DC

## 2022-08-21 NOTE — Telephone Encounter (Signed)
done

## 2022-08-21 NOTE — Progress Notes (Signed)
Subjective: 1. Renal stones   2. BPH with urinary obstruction   3. Weak urinary stream   4. History of gross hematuria   5. Intermittent urinary stream     08/20/22: Mr. Caleb Potter returns today for the history below.   KUB prior to the visit today shows a stable right renal stone and no ureteral stones.  He is having some intermittency over the last couple of weeks with just a couple of drops coming out.  He doesn't have a problem after he has a couple of beers.  His IPSS is 6 with some frequency.   He remains on finasteride and tamsulosin 0.8mg .  He had a Rezum about 3 years ago.  He has had no hematuria or flank pain.   His PSA is 1 with a 25% f/t ratio.     08/22/21: Mr. Caleb Potter returns for the history noted below.  He was put back on finasteride at his last visit and his symptoms have improved.  He remains on tamsulosin as well.  His PSA is 1.2.  His IPSS is 8 with nocturia x 1.  He has had no further hematuria.  He has had some mild right low back pain but no flank pain.    02/21/21: Mr. Caleb Potter returns today in f/u for cystoscopy to complete his hematuria evaluation.  A CT hematuria study demonstrated small non-obstructing bilateral lower pole renal stones and an enlarged prostate with some bladder wall thickening.  He passed a stone on around 02/03/21 but he had no pain with the passage.  His IPSS is 18.  His UA today is unremarkable. He remains on tamsulosin 0.8mg  daily.  He is off of the finasteride since last year after recovering from the Rezum procedure done by Dr. Gloriann Loan in 11/20.    01/03/21: Mr. Caleb Potter returns today at the request of Dr. Quillian Quince for hematuria.   I repaired a bladder rupture on him in 4/20.  He had ureteroscopy with laser by Dr. Tresa Moore in 2016.  He had an episode of hematuria in January.  He had clots and then cleared over a 24hr period.  He saw Dr. Quillian Quince and the UA still had blood.  He has no dyuria. His IPSS is 9 with some frequency and urgency with a reduced stream.  He did have a  little LLQ pain with the episode.  He remains on tamsulosin and is up to 0.8mg  daily.   He continues to smoke.   CT in 4/21 demonstrated a 2mm right renal stone but no left renal stones.    IPSS     Row Name 08/21/22 1000         International Prostate Symptom Score   How often have you had the sensation of not emptying your bladder? Less than 1 in 5     How often have you had to urinate less than every two hours? Less than half the time     How often have you found you stopped and started again several times when you urinated? Less than 1 in 5 times     How often have you found it difficult to postpone urination? Less than 1 in 5 times     How often have you had a weak urinary stream? Less than 1 in 5 times     How often have you had to strain to start urination? Not at All     How many times did you typically get up at night to urinate? None  Total IPSS Score 6       Quality of Life due to urinary symptoms   If you were to spend the rest of your life with your urinary condition just the way it is now how would you feel about that? Mostly Satisfied              ROS:  Review of Systems  HENT:  Positive for congestion.   Respiratory:  Positive for shortness of breath.   Gastrointestinal:  Positive for heartburn.  Musculoskeletal:  Positive for joint pain.  Endo/Heme/Allergies:  Positive for polydipsia.  Psychiatric/Behavioral:  Positive for depression. The patient is nervous/anxious.     Allergies  Allergen Reactions   Citalopram Other (See Comments)    Grind teeth. "high power cocaine"     Past Medical History:  Diagnosis Date   Anxiety    Arthritis    Chronic kidney disease    Coronary artery disease    Depression    GERD (gastroesophageal reflux disease)    Gout    History of hiatal hernia    Hyperlipidemia    Hypertension    Ischemic heart disease    Kidney stone    Myocardial infarction ([place]) 2000   light MI   Osteoarthritis    Sleep apnea     unremarkable sleep test and turned in machine (insurance would not keep covering)    Past Surgical History:  Procedure Laterality Date   BLADDER REPAIR  01/30/2019   Procedure: Bladder Repair;  Surgeon: Irine Seal, MD;  Location: [facility];  Service: Urology;;   CARPAL TUNNEL RELEASE  2010   right wrist   COLONOSCOPY  2008   CORONARY ANGIOPLASTY  2000   CYST REMOVAL NECK  2008   CYSTOSCOPY WITH RETROGRADE PYELOGRAM, URETEROSCOPY AND STENT PLACEMENT Bilateral 11/07/2014   Procedure: CYSTOSCOPY WITH RETROGRADE PYELOGRAM, URETEROSCOPY AND STENT PLACEMENT;  Surgeon: Alexis Frock, MD;  [facility];  Service: Urology;  Laterality: Bilateral;   HERNIA REPAIR  1994   left inguinal   HIATAL HERNIA REPAIR  2007   HOLMIUM LASER APPLICATION Bilateral 99991111   Procedure: HOLMIUM LASER APPLICATION;  Surgeon: Alexis Frock, MD;  [facility];  Service: Urology;  Laterality: Bilateral;   LAPAROTOMY N/A 01/30/2019   Procedure: EXPLORATORY LAPAROTOMY;  Surgeon: Irine Seal, MD;  Location: [facility];  Service: Urology;  Laterality: N/A;   right knee reconstruction  2010   right    Social History   Socioeconomic History   Marital status: Married    Spouse name: Not on file   Number of children: 2   Years of education: Not on file   Highest education level: Not on file  Occupational History   Not on file  Tobacco Use   Smoking status: Every Day    Packs/day: 1.00    Years: 40.00    Total pack years: 40.00    Types: Cigarettes   Smokeless tobacco: Former  Scientific laboratory technician Use: Never used  Substance and Sexual Activity   Alcohol use: Yes    Comment: 4-5 beers per day   Drug use: Never   Sexual activity: Not Currently  Other Topics Concern   Not on file  Social History Narrative   Not on file   Social Determinants of Health   Financial Resource Strain: Not on file  Food Insecurity: Not on file  Transportation Needs: Not on file  Physical Activity: Not on file  Stress: Not  on file  Social Connections: Not on file  Intimate Partner Violence: Not on file    Family History  Problem Relation Age of Onset   Diabetes Mother    Stroke Father    CAD Father     Anti-infectives: Anti-infectives (From admission, onward)    None       Current Outpatient Medications  Medication Sig Dispense Refill   acetaminophen (TYLENOL) 650 MG CR tablet Take 1,300 mg by mouth daily.     allopurinol (ZYLOPRIM) 300 MG tablet Take 300 mg by mouth daily.     Ascorbic Acid (VITA-C PO) Take by mouth.     Ascorbic Acid (VITAMIN C) 1000 MG tablet Take 1,000 mg by mouth daily.     aspirin EC 81 MG tablet Take 81 mg by mouth daily.     cholecalciferol (VITAMIN D) 1000 UNITS tablet Take 1,000 Units by mouth daily.     cyclobenzaprine (FLEXERIL) 10 MG tablet Take 10 mg by mouth 3 (three) times daily as needed for muscle spasms.      diazepam (VALIUM) 10 MG tablet Take 10 mg by mouth daily. *May take one tablet three times daily as needed for anxiety     DULoxetine (CYMBALTA) 60 MG capsule Take 60 mg by mouth daily. For pain and depression     finasteride (PROSCAR) 5 MG tablet TAKE 1 TABLET(5 MG) BY MOUTH DAILY 90 tablet 3   Fluticasone-Umeclidin-Vilant 100-62.5-25 MCG/INH AEPB Inhale 1 puff into the lungs daily. For COPD     metoprolol succinate (TOPROL-XL) 50 MG 24 hr tablet Take 1 tablet (50 mg total) by mouth daily. Take with or immediately following a meal. 30 tablet 0   omeprazole (PRILOSEC) 20 MG capsule Take 1 capsule (20 mg total) by mouth 2 (two) times daily before a meal. 60 capsule 3   polyethylene glycol (MIRALAX) 17 g packet Take 17 g by mouth daily. (Patient taking differently: Take 17 g by mouth daily as needed for mild constipation or moderate constipation.) 30 each 11   simvastatin (ZOCOR) 40 MG tablet Take 40 mg by mouth daily. For high cholesterol     tamsulosin (FLOMAX) 0.4 MG CAPS capsule Take 1 capsule (0.4 mg total) by mouth 2 (two) times daily. 180 capsule 3    No current facility-administered medications for this visit.     Objective: Vital signs in last 24 hours: BP (!) 140/85   Pulse (!) 102   Intake/Output from previous day: No intake/output data recorded. Intake/Output this shift: @IOTHISSHIFT @ UA is clear.   Physical Exam Vitals reviewed.  Constitutional:      Appearance: Normal appearance.  Neurological:     Mental Status: He is alert.     Lab Results:  .  UA is clear.    Lab Results  Component Value Date   PSA1 1.0 08/14/2022   PSA1 1.2 08/15/2021    Results for orders placed or performed in visit on 08/21/22 (from the past 24 hour(s))  Urinalysis, Routine w reflex microscopic     Status: None   Collection Time: 08/21/22  9:46 AM  Result Value Ref Range   Specific Gravity, UA 1.025 1.005 - 1.030   pH, UA 5.0 5.0 - 7.5   Color, UA Yellow Yellow   Appearance Ur Clear Clear   Leukocytes,UA Negative Negative   Protein,UA Negative Negative/Trace   Glucose, UA Negative Negative   Ketones, UA Negative Negative   RBC, UA Negative Negative   Bilirubin, UA Negative Negative   Urobilinogen, Ur  0.2 0.2 - 1.0 mg/dL   Nitrite, UA Negative Negative   Microscopic Examination Comment    Narrative   Performed at:  [facility] [address] [place], [place]  [id number] Lab Director: [place], Phone:  [phone]      Studies/Results:  KUB reviewed.  Report pending.   Assessment/Plan: BPH with BOO with neovascularity at the [facility] related to the prior Rezum.  He has done well with the addition of finasteride to the tamsulosin and has had no further bleeding.   He has had some recent intermittency.  I discussed alternate therapy for his BPH including TURP and Aquablation.  He would like to stay on current therapy and his meds were refilled.   I will have him return in a year with a PSA.   Renal stones.  He has stable renal stones and will have a KUB in a  year.    Meds ordered this encounter   Medications   DISCONTD: tamsulosin (FLOMAX) 0.4 MG CAPS capsule    Sig: Take 1 capsule (0.4 mg total) by mouth 2 (two) times daily.    Dispense:  180 capsule    Refill:  3   DISCONTD: finasteride (PROSCAR) 5 MG tablet    Sig: TAKE 1 TABLET(5 MG) BY MOUTH DAILY    Dispense:  90 tablet    Refill:  3      Orders Placed This Encounter  Procedures   DG Abd 1 View    Standing Status:   Future    Standing Expiration Date:   08/22/2023    Order Specific Question:   Reason for Exam (SYMPTOM  OR DIAGNOSIS REQUIRED)    Answer:   renal stones    Order Specific Question:   Preferred imaging location?    Answer:   [facility]    Order Specific Question:   Radiology Contrast Protocol - do NOT remove file path    Answer:   [email or web address]   Urinalysis, Routine w reflex microscopic   PSA, total and free    Standing Status:   Future    Standing Expiration Date:   08/22/2023     Return in about 1 year (around 08/22/2023) for with KUB and labs.    CC: Dr. Gar Ponto.     Irine Seal 08/22/2022 [id number] ID: Milon Dikes, male   DOB: 11-23-1960, 61 y.o.   MRN: [id number]

## 2022-08-21 NOTE — Telephone Encounter (Signed)
Patient's medications from this morning's visit needs to be sent to his new pharmacy  [facility], [place] Phone: [phone]  Fax: [phone]     Thanks, Helene Kelp

## 2022-08-22 ENCOUNTER — Encounter: Payer: Self-pay | Admitting: Urology

## 2023-02-17 DIAGNOSIS — I10 Essential (primary) hypertension: Secondary | ICD-10-CM | POA: Diagnosis not present

## 2023-02-17 DIAGNOSIS — R5383 Other fatigue: Secondary | ICD-10-CM | POA: Diagnosis not present

## 2023-02-17 DIAGNOSIS — Z1329 Encounter for screening for other suspected endocrine disorder: Secondary | ICD-10-CM | POA: Diagnosis not present

## 2023-02-17 DIAGNOSIS — E7849 Other hyperlipidemia: Secondary | ICD-10-CM | POA: Diagnosis not present

## 2023-02-17 DIAGNOSIS — E871 Hypo-osmolality and hyponatremia: Secondary | ICD-10-CM | POA: Diagnosis not present

## 2023-02-17 DIAGNOSIS — R7301 Impaired fasting glucose: Secondary | ICD-10-CM | POA: Diagnosis not present

## 2023-02-17 DIAGNOSIS — E876 Hypokalemia: Secondary | ICD-10-CM | POA: Diagnosis not present

## 2023-02-17 DIAGNOSIS — Z125 Encounter for screening for malignant neoplasm of prostate: Secondary | ICD-10-CM | POA: Diagnosis not present

## 2023-02-17 DIAGNOSIS — K219 Gastro-esophageal reflux disease without esophagitis: Secondary | ICD-10-CM | POA: Diagnosis not present

## 2023-02-17 DIAGNOSIS — E782 Mixed hyperlipidemia: Secondary | ICD-10-CM | POA: Diagnosis not present

## 2023-05-18 ENCOUNTER — Other Ambulatory Visit: Payer: Self-pay | Admitting: Urology

## 2023-05-18 DIAGNOSIS — N401 Enlarged prostate with lower urinary tract symptoms: Secondary | ICD-10-CM

## 2023-06-30 ENCOUNTER — Other Ambulatory Visit: Payer: Self-pay | Admitting: Family Medicine

## 2023-06-30 DIAGNOSIS — F17200 Nicotine dependence, unspecified, uncomplicated: Secondary | ICD-10-CM

## 2023-07-17 ENCOUNTER — Other Ambulatory Visit: Payer: 59

## 2023-07-17 ENCOUNTER — Ambulatory Visit
Admission: RE | Admit: 2023-07-17 | Discharge: 2023-07-17 | Disposition: A | Discharge status: Home / Self Care | Payer: 59 | Source: Ambulatory Visit | Attending: Family Medicine | Admitting: Family Medicine

## 2023-07-17 DIAGNOSIS — F17200 Nicotine dependence, unspecified, uncomplicated: Secondary | ICD-10-CM

## 2023-08-20 ENCOUNTER — Other Ambulatory Visit: Payer: 59

## 2023-08-27 ENCOUNTER — Ambulatory Visit: Payer: 59 | Admitting: Urology

## 2023-09-19 ENCOUNTER — Other Ambulatory Visit: Payer: Self-pay | Admitting: Urology

## 2023-09-19 DIAGNOSIS — N401 Enlarged prostate with lower urinary tract symptoms: Secondary | ICD-10-CM

## 2023-09-21 ENCOUNTER — Telehealth: Payer: Self-pay

## 2023-09-21 NOTE — Telephone Encounter (Signed)
-----   Message from Bjorn Pippin sent at 09/20/2023  7:09 PM EST ----- He is overdue for annual f/u.   He needs to get back in in the next 3 months with a PSA.   I will send a 3 month refill on the finasteride.

## 2023-09-21 NOTE — Telephone Encounter (Signed)
Appt scheduled with pt and reminders mailed to patient.

## 2023-11-11 ENCOUNTER — Other Ambulatory Visit: Payer: Self-pay | Admitting: Urology

## 2023-11-11 DIAGNOSIS — N138 Other obstructive and reflux uropathy: Secondary | ICD-10-CM

## 2023-11-13 DIAGNOSIS — K219 Gastro-esophageal reflux disease without esophagitis: Secondary | ICD-10-CM | POA: Diagnosis not present

## 2023-11-13 DIAGNOSIS — E876 Hypokalemia: Secondary | ICD-10-CM | POA: Diagnosis not present

## 2023-11-13 DIAGNOSIS — E782 Mixed hyperlipidemia: Secondary | ICD-10-CM | POA: Diagnosis not present

## 2023-12-08 ENCOUNTER — Other Ambulatory Visit: Payer: Self-pay

## 2023-12-08 DIAGNOSIS — N138 Other obstructive and reflux uropathy: Secondary | ICD-10-CM

## 2023-12-31 ENCOUNTER — Other Ambulatory Visit: Payer: Self-pay

## 2023-12-31 DIAGNOSIS — N401 Enlarged prostate with lower urinary tract symptoms: Secondary | ICD-10-CM | POA: Diagnosis not present

## 2023-12-31 DIAGNOSIS — N138 Other obstructive and reflux uropathy: Secondary | ICD-10-CM

## 2024-01-01 LAB — PSA: Prostate Specific Ag, Serum: 2.1 ng/mL (ref 0.0–4.0)

## 2024-01-07 ENCOUNTER — Ambulatory Visit: Payer: 59 | Admitting: Urology

## 2024-01-07 ENCOUNTER — Encounter: Payer: Self-pay | Admitting: Urology

## 2024-01-07 VITALS — BP 143/94 | HR 91

## 2024-01-07 DIAGNOSIS — N401 Enlarged prostate with lower urinary tract symptoms: Secondary | ICD-10-CM

## 2024-01-07 DIAGNOSIS — R3913 Splitting of urinary stream: Secondary | ICD-10-CM

## 2024-01-07 DIAGNOSIS — N138 Other obstructive and reflux uropathy: Secondary | ICD-10-CM

## 2024-01-07 DIAGNOSIS — R3912 Poor urinary stream: Secondary | ICD-10-CM

## 2024-01-07 DIAGNOSIS — N200 Calculus of kidney: Secondary | ICD-10-CM

## 2024-01-07 MED ORDER — FINASTERIDE 5 MG PO TABS: 5.0000 mg | ORAL_TABLET | Freq: Every day | ORAL | 3 refills | Status: AC

## 2024-01-07 MED ORDER — TAMSULOSIN HCL 0.4 MG PO CAPS: 0.8000 mg | ORAL_CAPSULE | Freq: Every day | ORAL | 3 refills | Status: AC

## 2024-01-07 NOTE — Progress Notes (Unsigned)
 Subjective: 1. BPH with urinary obstruction   2. Weak urinary stream   3. Intermittent urinary stream   4. Renal stones      01/07/24: Caleb Potter returns today in f/u for his history of BPH with BOO and stones. He had a REZUM in 11/20.  His PSA is 2.1 which is up but he just got over RSV.  He remains on finasteride and tamsulosin 0.8mg  daily.   He had a 9mm right renal stone on his KUB on 08/21/22 and on CT 02/12/21. He has had no hematuria or flank pain. His UA was clear.      08/20/22: Caleb Potter returns today for the history below.   KUB prior to the visit today shows a stable right renal stone and no ureteral stones.  He is having some intermittency over the last couple of weeks with just a couple of drops coming out.  He doesn't have a problem after he has a couple of beers.  His IPSS is 6 with some frequency.   He remains on finasteride and tamsulosin 0.8mg .  He had a Rezum about 3 years ago.  He has had no hematuria or flank pain.   His PSA is 1 with a 25% f/t ratio.     08/22/21: Caleb Potter returns for the history noted below.  He was put back on finasteride at his last visit and his symptoms have improved.  He remains on tamsulosin as well.  His PSA is 1.2.  His IPSS is 8 with nocturia x 1.  He has had no further hematuria.  He has had some mild right low back pain but no flank pain.    02/21/21: Caleb Potter returns today in f/u for cystoscopy to complete his hematuria evaluation.  A CT hematuria study demonstrated small non-obstructing bilateral lower pole renal stones and an enlarged prostate with some bladder wall thickening.  He passed a stone on around 02/03/21 but he had no pain with the passage.  His IPSS is 18.  His UA today is unremarkable. He remains on tamsulosin 0.8mg  daily.  He is off of the finasteride since last year after recovering from the Rezum procedure done by Dr. Alvester Morin in 11/20.    01/03/21: Caleb Potter returns today at the request of Dr. Reuel Boom for hematuria.   I repaired a bladder  rupture on him in 4/20.  He had ureteroscopy with laser by Dr. Berneice Potter in 2016.  He had an episode of hematuria in January.  He had clots and then cleared over a 24hr period.  He saw Dr. Reuel Boom and the UA still had blood.  He has no dyuria. His IPSS is 9 with some frequency and urgency with a reduced stream.  He did have a little LLQ pain with the episode.  He remains on tamsulosin and is up to 0.8mg  daily.   He continues to smoke.   CT in 4/21 demonstrated a 6mm right renal stone but no left renal stones.    IPSS     Row Name 01/07/24 1100         International Prostate Symptom Score   How often have you had the sensation of not emptying your bladder? Less than 1 in 5     How often have you had to urinate less than every two hours? Less than half the time     How often have you found you stopped and started again several times when you urinated? Less than half the time  How often have you found it difficult to postpone urination? Less than half the time     How often have you had a weak urinary stream? Less than 1 in 5 times     How often have you had to strain to start urination? Not at All     How many times did you typically get up at night to urinate? 1 Time     Total IPSS Score 9       Quality of Life due to urinary symptoms   If you were to spend the rest of your life with your urinary condition just the way it is now how would you feel about that? Mostly Satisfied               ROS:  Review of Systems  HENT:  Positive for congestion.   Respiratory:  Positive for shortness of breath.   Gastrointestinal:  Positive for heartburn.  Musculoskeletal:  Positive for joint pain.  Endo/Heme/Allergies:  Positive for polydipsia.  Psychiatric/Behavioral:  Positive for depression. The patient is nervous/anxious.     Allergies  Allergen Reactions   Citalopram Other (See Comments)    Grind teeth. "high power cocaine"     Past Medical History:  Diagnosis Date   Anxiety     Arthritis    Chronic kidney disease    Coronary artery disease    Depression    GERD (gastroesophageal reflux disease)    Gout    History of hiatal hernia    Hyperlipidemia    Hypertension    Ischemic heart disease    Kidney stone    Myocardial infarction (HCC) 2000   light MI   Osteoarthritis    Sleep apnea    unremarkable sleep test and turned in machine (insurance would not keep covering)    Past Surgical History:  Procedure Laterality Date   BLADDER REPAIR  01/30/2019   Procedure: Bladder Repair;  Surgeon: Bjorn Pippin, MD;  Location: [facility] OR;  Service: Urology;;   CARPAL TUNNEL RELEASE  2010   right wrist   COLONOSCOPY  2008   CORONARY ANGIOPLASTY  2000   CYST REMOVAL NECK  2008   CYSTOSCOPY WITH RETROGRADE PYELOGRAM, URETEROSCOPY AND STENT PLACEMENT Bilateral 11/07/2014   Procedure: CYSTOSCOPY WITH RETROGRADE PYELOGRAM, URETEROSCOPY AND STENT PLACEMENT;  Surgeon: Sebastian Ache, MD;  Location: [facility] ORS;  Service: Urology;  Laterality: Bilateral;   HERNIA REPAIR  1994   left inguinal   HIATAL HERNIA REPAIR  2007   HOLMIUM LASER APPLICATION Bilateral 11/07/2014   Procedure: HOLMIUM LASER APPLICATION;  Surgeon: Sebastian Ache, MD;  Location: [facility] OR[facility];  Service: Urology;  Laterality: Bilateral;   LAPAROTOMY N/A 01/30/2019   Procedure: EXPLORATORY LAPAROTOMY;  Surgeon: Bjorn Pippin, MD;  Location: [facility] OR;  Service: Urology;  Laterality: N/A;   right knee reconstruction  2010   right    Social History   Socioeconomic History   Marital status: Married    Spouse name: Not on file   Number of children: 2   Years of education: Not on file   Highest education level: Not on file  Occupational History   Not on file  Tobacco Use   Smoking status: Every Day    Current packs/day: 1.00    Average packs/day: 1 pack/day for 40.0 years (40.0 ttl pk-yrs)    Types: Cigarettes   Smokeless tobacco: Former  Building services engineer status: Never Used  Substance and Sexual Activity   Alcohol  use:  Yes    Comment: 4-5 beers per day   Drug use: Never   Sexual activity: Not Currently  Other Topics Concern   Not on file  Social History Narrative   Not on file   Social Drivers of Health   Financial Resource Strain: Not on file  Food Insecurity: Not on file  Transportation Needs: Not on file  Physical Activity: Not on file  Stress: Not on file  Social Connections: Not on file  Intimate Partner Violence: Not on file    Family History  Problem Relation Age of Onset   Diabetes Mother    Stroke Father    CAD Father     Anti-infectives: Anti-infectives (From admission, onward)    None       Current Outpatient Medications  Medication Sig Dispense Refill   allopurinol (ZYLOPRIM) 300 MG tablet Take 300 mg by mouth daily.     aspirin EC 81 MG tablet Take 81 mg by mouth daily.     cholecalciferol (VITAMIN D) 1000 UNITS tablet Take 1,000 Units by mouth daily.     cyclobenzaprine (FLEXERIL) 10 MG tablet Take 10 mg by mouth 3 (three) times daily as needed for muscle spasms.      diazepam (VALIUM) 10 MG tablet Take 10 mg by mouth daily. *May take one tablet three times daily as needed for anxiety     DULoxetine (CYMBALTA) 60 MG capsule Take 60 mg by mouth daily. For pain and depression     metoprolol succinate (TOPROL-XL) 50 MG 24 hr tablet Take 1 tablet (50 mg total) by mouth daily. Take with or immediately following a meal. 30 tablet 0   omeprazole (PRILOSEC) 20 MG capsule Take 1 capsule (20 mg total) by mouth 2 (two) times daily before a meal. 60 capsule 3   polyethylene glycol (MIRALAX) 17 g packet Take 17 g by mouth daily. (Patient taking differently: Take 17 g by mouth daily as needed for mild constipation or moderate constipation.) 30 each 11   simvastatin (ZOCOR) 40 MG tablet Take 40 mg by mouth daily. For high cholesterol     finasteride (PROSCAR) 5 MG tablet Take 1 tablet (5 mg total) by mouth daily. 100 tablet 3   tamsulosin (FLOMAX) 0.4 MG CAPS capsule Take 2  capsules (0.8 mg total) by mouth daily. 200 capsule 3   No current facility-administered medications for this visit.     Objective: Vital signs in last 24 hours: BP (!) 143/94   Pulse 91   Intake/Output from previous day: No intake/output data recorded. Intake/Output this shift: @IOTHISSHIFT @ UA is clear.   Physical Exam Vitals reviewed.  Constitutional:      Appearance: Normal appearance.  Genitourinary:    Comments: AP/NST without masses or lesions. Prostate 2+ benign. SV non-palpable.  Neurological:     Mental Status: He is alert.     Lab Results:  .  UA is clear.    Lab Results  Component Value Date   PSA1 2.1 12/31/2023   PSA1 1.0 08/14/2022   PSA1 1.2 08/15/2021    No results found for this or any previous visit (from the past 24 hours).     Studies/Results:  KUB reviewed.  Report pending.   Assessment/Plan: BPH with BOO with neovascularity at the [facility] related to the prior Rezum.  He has done well with the addition of finasteride to the tamsulosin and has had no further bleeding.    He would like to stay on current therapy and his  meds were refilled.   I will have him return in a year with a PSA.   PSA increase:  His PSA is up from 1.0 to 2.1 but he had RSV for about 6 weeks around the time of the blood draw.  I will repeat the PSA in 3 months and then again in a year.  If the PSA is up further in 3 months he may need an MRI.   Renal stones.  He had stable renal stones in 11/23.  I will get a KUB today.     Meds ordered this encounter  Medications   finasteride (PROSCAR) 5 MG tablet    Sig: Take 1 tablet (5 mg total) by mouth daily.    Dispense:  100 tablet    Refill:  3   tamsulosin (FLOMAX) 0.4 MG CAPS capsule    Sig: Take 2 capsules (0.8 mg total) by mouth daily.    Dispense:  200 capsule    Refill:  3      Orders Placed This Encounter  Procedures   DG Abd 1 View    Standing Status:   Future    Expected Date:   01/07/2024     Expiration Date:   04/08/2024    Reason for Exam (SYMPTOM  OR DIAGNOSIS REQUIRED):   renal stone    Preferred imaging location?:   [facility]    Radiology Contrast Protocol - do NOT remove file path:   [email or web address]   PSA, total and free    Standing Status:   Future    Expected Date:   04/08/2024    Expiration Date:   07/09/2024   PSA, total and free    Standing Status:   Future    Expected Date:   12/30/2024    Expiration Date:   01/06/2025     Return in about 1 year (around 01/06/2025) for PSA in 3months and a year.  f/u 1 year with any available provide. .    CC: Dr. Donzetta Sprung.     Bjorn Pippin 01/08/2024 [phone] ID: Ray Potter, male   DOB: 1960-11-23, 63 y.o.   MRN: [id number]

## 2024-03-21 DIAGNOSIS — E782 Mixed hyperlipidemia: Secondary | ICD-10-CM | POA: Diagnosis not present

## 2024-03-21 DIAGNOSIS — Z0001 Encounter for general adult medical examination with abnormal findings: Secondary | ICD-10-CM | POA: Diagnosis not present

## 2024-03-21 DIAGNOSIS — E7849 Other hyperlipidemia: Secondary | ICD-10-CM | POA: Diagnosis not present

## 2024-03-21 DIAGNOSIS — M10071 Idiopathic gout, right ankle and foot: Secondary | ICD-10-CM | POA: Diagnosis not present

## 2024-03-21 DIAGNOSIS — E559 Vitamin D deficiency, unspecified: Secondary | ICD-10-CM | POA: Diagnosis not present

## 2024-04-11 ENCOUNTER — Other Ambulatory Visit

## 2024-04-12 ENCOUNTER — Other Ambulatory Visit

## 2024-04-12 DIAGNOSIS — N138 Other obstructive and reflux uropathy: Secondary | ICD-10-CM

## 2024-04-13 ENCOUNTER — Ambulatory Visit: Payer: Self-pay

## 2024-04-13 LAB — PSA, TOTAL AND FREE
PSA, Free Pct: 22 %
PSA, Free: 0.22 ng/mL
Prostate Specific Ag, Serum: 1 ng/mL (ref 0.0–4.0)

## 2024-04-13 NOTE — Telephone Encounter (Signed)
 Called pt to let him know per MD Watt PSA is back down to 1.  Pt voiced his understanding

## 2024-08-04 DIAGNOSIS — R5383 Other fatigue: Secondary | ICD-10-CM | POA: Diagnosis not present

## 2024-08-04 DIAGNOSIS — Z130 Encounter for screening for diseases of the blood and blood-forming organs and certain disorders involving the immune mechanism: Secondary | ICD-10-CM | POA: Diagnosis not present

## 2024-08-04 DIAGNOSIS — E876 Hypokalemia: Secondary | ICD-10-CM | POA: Diagnosis not present

## 2024-08-04 DIAGNOSIS — E7849 Other hyperlipidemia: Secondary | ICD-10-CM | POA: Diagnosis not present

## 2024-08-04 DIAGNOSIS — R739 Hyperglycemia, unspecified: Secondary | ICD-10-CM | POA: Diagnosis not present

## 2024-08-04 DIAGNOSIS — N179 Acute kidney failure, unspecified: Secondary | ICD-10-CM | POA: Diagnosis not present

## 2024-08-11 ENCOUNTER — Other Ambulatory Visit (HOSPITAL_COMMUNITY): Payer: Self-pay | Admitting: Family Medicine

## 2024-08-11 DIAGNOSIS — Z0001 Encounter for general adult medical examination with abnormal findings: Secondary | ICD-10-CM | POA: Diagnosis not present

## 2024-08-11 DIAGNOSIS — M170 Bilateral primary osteoarthritis of knee: Secondary | ICD-10-CM | POA: Diagnosis not present

## 2024-08-11 DIAGNOSIS — M10071 Idiopathic gout, right ankle and foot: Secondary | ICD-10-CM | POA: Diagnosis not present

## 2024-08-11 DIAGNOSIS — Z23 Encounter for immunization: Secondary | ICD-10-CM | POA: Diagnosis not present

## 2024-08-11 DIAGNOSIS — F17210 Nicotine dependence, cigarettes, uncomplicated: Secondary | ICD-10-CM | POA: Diagnosis not present

## 2024-08-11 DIAGNOSIS — F17200 Nicotine dependence, unspecified, uncomplicated: Secondary | ICD-10-CM

## 2024-08-11 DIAGNOSIS — E559 Vitamin D deficiency, unspecified: Secondary | ICD-10-CM | POA: Diagnosis not present

## 2024-08-11 DIAGNOSIS — K2100 Gastro-esophageal reflux disease with esophagitis, without bleeding: Secondary | ICD-10-CM | POA: Diagnosis not present

## 2024-08-11 DIAGNOSIS — I10 Essential (primary) hypertension: Secondary | ICD-10-CM | POA: Diagnosis not present

## 2024-08-11 DIAGNOSIS — J449 Chronic obstructive pulmonary disease, unspecified: Secondary | ICD-10-CM | POA: Diagnosis not present

## 2024-08-11 DIAGNOSIS — I700 Atherosclerosis of aorta: Secondary | ICD-10-CM | POA: Diagnosis not present

## 2025-01-02 ENCOUNTER — Other Ambulatory Visit

## 2025-01-09 ENCOUNTER — Ambulatory Visit: Admitting: Urology
